# Patient Record
Sex: Female | Born: 1980
Health system: Southern US, Community
[De-identification: ages and names within clinical notes are randomized; demographics above are authoritative.]

## PROBLEM LIST (undated history)

## (undated) DIAGNOSIS — Z807 Family history of other malignant neoplasms of lymphoid, hematopoietic and related tissues: Secondary | ICD-10-CM

## (undated) DIAGNOSIS — A64 Unspecified sexually transmitted disease: Secondary | ICD-10-CM

## (undated) DIAGNOSIS — F32A Depression, unspecified: Secondary | ICD-10-CM

## (undated) DIAGNOSIS — G4730 Sleep apnea, unspecified: Secondary | ICD-10-CM

## (undated) DIAGNOSIS — O139 Gestational [pregnancy-induced] hypertension without significant proteinuria, unspecified trimester: Secondary | ICD-10-CM

## (undated) DIAGNOSIS — R7303 Prediabetes: Secondary | ICD-10-CM

## (undated) DIAGNOSIS — M549 Dorsalgia, unspecified: Secondary | ICD-10-CM

## (undated) DIAGNOSIS — I10 Essential (primary) hypertension: Secondary | ICD-10-CM

## (undated) DIAGNOSIS — K219 Gastro-esophageal reflux disease without esophagitis: Secondary | ICD-10-CM

## (undated) DIAGNOSIS — R0600 Dyspnea, unspecified: Secondary | ICD-10-CM

## (undated) DIAGNOSIS — F419 Anxiety disorder, unspecified: Secondary | ICD-10-CM

## (undated) DIAGNOSIS — F909 Attention-deficit hyperactivity disorder, unspecified type: Secondary | ICD-10-CM

## (undated) DIAGNOSIS — F329 Major depressive disorder, single episode, unspecified: Secondary | ICD-10-CM

## (undated) DIAGNOSIS — Z801 Family history of malignant neoplasm of trachea, bronchus and lung: Secondary | ICD-10-CM

## (undated) HISTORY — DX: Attention-deficit hyperactivity disorder, unspecified type: F90.9

## (undated) HISTORY — PX: OTHER SURGICAL HISTORY: SHX169

## (undated) HISTORY — PX: ASPIRATION BIOPSY: SHX1197

## (undated) HISTORY — DX: Dorsalgia, unspecified: M54.9

## (undated) HISTORY — DX: Family history of malignant neoplasm of trachea, bronchus and lung: Z80.1

## (undated) HISTORY — DX: Anxiety disorder, unspecified: F41.9

## (undated) HISTORY — DX: Family history of other malignant neoplasms of lymphoid, hematopoietic and related tissues: Z80.7

## (undated) HISTORY — DX: Unspecified sexually transmitted disease: A64

## (undated) HISTORY — DX: Depression, unspecified: F32.A

## (undated) HISTORY — DX: Essential (primary) hypertension: I10

---
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
## 2009-03-27 ENCOUNTER — Emergency Department (HOSPITAL_COMMUNITY): Admission: EM | Admit: 2009-03-27 | Discharge: 2009-03-27 | Payer: Self-pay | Admitting: Family Medicine

## 2010-01-02 ENCOUNTER — Emergency Department (HOSPITAL_COMMUNITY): Admission: EM | Admit: 2010-01-02 | Discharge: 2010-01-02 | Payer: Self-pay | Admitting: Family Medicine

## 2012-07-03 ENCOUNTER — Emergency Department (HOSPITAL_COMMUNITY)
Admission: EM | Admit: 2012-07-03 | Discharge: 2012-07-03 | Disposition: A | Discharge status: Home / Self Care | Payer: 59 | Attending: Emergency Medicine | Admitting: Emergency Medicine

## 2012-07-03 ENCOUNTER — Encounter (HOSPITAL_COMMUNITY): Payer: Self-pay | Admitting: *Deleted

## 2012-07-03 DIAGNOSIS — L723 Sebaceous cyst: Secondary | ICD-10-CM | POA: Insufficient documentation

## 2012-07-03 DIAGNOSIS — R229 Localized swelling, mass and lump, unspecified: Secondary | ICD-10-CM

## 2012-07-03 HISTORY — DX: Gestational (pregnancy-induced) hypertension without significant proteinuria, unspecified trimester: O13.9

## 2012-07-03 NOTE — ED Provider Notes (Signed)
History     CSN: [id number]  Arrival date & time 07/03/12  1478   First MD Initiated Contact with Patient 07/03/12 [phone]      Chief Complaint  Patient presents with  . Acne  . Cyst    (Consider location/radiation/quality/duration/timing/severity/associated sxs/prior treatment) HPI  31 year old female presents c/o cyst of L face.  Sts she has noticed a skin nodule on her L face for several years, It has been unchanged.  There has been no associate pain, discharge rash, redness.  No headache, ear pain or jaw pain.  Occasionally it would itch.  Pt recently acquired health insurance and would like to have it evaluated.    Past Medical History  Diagnosis Date  . Gestational hypertension     Past Surgical History  Procedure Date  . Aspiration biopsy     fluid around heart- negative results in 2000    History reviewed. No pertinent family history.  History  Substance Use Topics  . Smoking status: Never Smoker   . Smokeless tobacco: Not on file  . Alcohol Use: No    OB History    Grav Para Term Preterm Abortions TAB SAB Ect Mult Living                  Review of Systems  Constitutional: Negative for fever and chills.  HENT: Negative for neck pain.   Skin: Negative for rash.  Neurological: Negative for numbness.    Allergies  Review of patient's allergies indicates no known allergies.  Home Medications  No current outpatient prescriptions on file.  BP 137/95  Pulse 65  Temp 97.9 F (36.6 C) (Oral)  Resp 18  SpO2 100%  Physical Exam  Nursing note and vitals reviewed. Constitutional: She is oriented to person, place, and time. She appears well-developed and well-nourished. No distress.  HENT:  Head: Normocephalic and atraumatic.       L face: a nodular skin lesion the size of a kidney bean  Anterio/inferior to L ear, firm, non tender, no rash, no fluctuance.    Eyes: Conjunctivae normal are normal.  Neck: Normal range of motion. Neck supple.    Neurological: She is alert and oriented to person, place, and time.  Skin: Skin is warm.  Psychiatric: She has a normal mood and affect.    ED Course  Procedures (including critical care time)  Labs Reviewed - No data to display No results found.   No diagnosis found.  1. Skin nodule, face  MDM  Pt with a nodular skin lesion to L face for several years.  It does not appear to be an abscess, and does not appear to be a cancerous tumor (round, same skin tone, no assymetrical border).  Plan to have pt f/u with dermatology or plastic surgeon for further care.  Pt voice understanding and agrees with plan.    BP 137/95  Pulse 65  Temp 97.9 F (36.6 C) (Oral)  Resp 18  SpO2 100%       Fayrene Helper, PA-C 07/03/12 2130

## 2012-07-03 NOTE — ED Notes (Signed)
PA at bedside.

## 2012-07-03 NOTE — ED Provider Notes (Signed)
Medical screening examination/treatment/procedure(s) were performed by non-physician practitioner and as supervising physician I was immediately available for consultation/collaboration.  Christopher J. Pollina, MD 07/03/12 1549 

## 2012-07-03 NOTE — ED Notes (Signed)
Pt escorted to discharge window. Pt verbalized understanding discharge instructions. In no acute distress.  

## 2012-07-03 NOTE — ED Notes (Signed)
Pt reports she has had a cyst/bump on her left cheek for a couple of years now. Has noticed over years it is getting bigger. Pt now has insurance to take care of bump. Denies pain, no discharge.

## 2012-07-10 ENCOUNTER — Encounter (HOSPITAL_COMMUNITY): Payer: Self-pay

## 2012-07-10 ENCOUNTER — Emergency Department (HOSPITAL_COMMUNITY): Payer: 59

## 2012-07-10 ENCOUNTER — Emergency Department (HOSPITAL_COMMUNITY)
Admission: EM | Admit: 2012-07-10 | Discharge: 2012-07-10 | Disposition: A | Discharge status: Home / Self Care | Payer: 59 | Attending: Emergency Medicine | Admitting: Emergency Medicine

## 2012-07-10 DIAGNOSIS — J111 Influenza due to unidentified influenza virus with other respiratory manifestations: Secondary | ICD-10-CM

## 2012-07-10 DIAGNOSIS — R059 Cough, unspecified: Secondary | ICD-10-CM | POA: Insufficient documentation

## 2012-07-10 DIAGNOSIS — R05 Cough: Secondary | ICD-10-CM | POA: Insufficient documentation

## 2012-07-10 MED ORDER — BENZONATATE 100 MG PO CAPS
200.0000 mg | ORAL_CAPSULE | Freq: Three times a day (TID) | ORAL | Status: DC | PRN
  Administered 2012-07-10: 200 mg via ORAL
  Filled 2012-07-10: qty 2

## 2012-07-10 MED ORDER — IBUPROFEN 800 MG PO TABS
800.0000 mg | ORAL_TABLET | Freq: Once | ORAL | Status: AC
  Administered 2012-07-10: 800 mg via ORAL
  Filled 2012-07-10: qty 1

## 2012-07-10 MED ORDER — HYDROCOD POLST-CHLORPHEN POLST 10-8 MG/5ML PO LQCR: 5.0000 mL | Freq: Two times a day (BID) | ORAL | Status: DC | PRN

## 2012-07-10 MED ORDER — BENZONATATE 200 MG PO CAPS: 200.0000 mg | ORAL_CAPSULE | Freq: Three times a day (TID) | ORAL | Status: DC | PRN

## 2012-07-10 MED ORDER — IBUPROFEN 800 MG PO TABS: 800.0000 mg | ORAL_TABLET | Freq: Four times a day (QID) | ORAL | Status: DC | PRN

## 2012-07-10 NOTE — ED Notes (Signed)
Per pt, cough since yesterday.  Chills, weakness, headache, and runny nose.

## 2012-07-10 NOTE — ED Notes (Signed)
Patient transported to X-ray 

## 2012-07-10 NOTE — ED Notes (Signed)
Discharge instructions reviewed. Rx given x3.  

## 2012-07-10 NOTE — ED Provider Notes (Signed)
History     CSN: [id number]  Arrival date & time 07/10/12  0116   First MD Initiated Contact with Patient 07/10/12 0136      Chief Complaint  Patient presents with  . Cough    (Consider location/radiation/quality/duration/timing/severity/associated sxs/prior treatment) HPI 32 year old female presents to the emergency department with complaint of cough, bodyaches, chills and rhinorrhea. Patient not receive a flu shot this year. She denies any smoking history or lung disease. No known sick contacts. Symptoms came on suddenly yesterday.  Past Medical History  Diagnosis Date  . Gestational hypertension     Past Surgical History  Procedure Date  . Aspiration biopsy     fluid around heart- negative results in 2000    History reviewed. No pertinent family history.  History  Substance Use Topics  . Smoking status: Never Smoker   . Smokeless tobacco: Not on file  . Alcohol Use: No    OB History    Grav Para Term Preterm Abortions TAB SAB Ect Mult Living                  Review of Systems  See History of Present Illness; otherwise all other systems are reviewed and negative  Allergies  Review of patient's allergies indicates no known allergies.  Home Medications   Current Outpatient Rx  Name  Route  Sig  Dispense  Refill  . DEXTROMETHORPHAN POLISTIREX ER 30 MG/5ML PO LQCR   Oral   Take 60 mg by mouth as needed. For cough         . SODIUM & POTASSIUM BICARBONATE PO TBEF   Oral   Take 1 tablet by mouth daily as needed. For cold symptoms           BP 136/81  Pulse 117  Temp 102.2 F (39 C) (Oral)  Resp 20  SpO2 96%  LMP 06/29/2012  Physical Exam  Nursing note and vitals reviewed. Constitutional: She is oriented to person, place, and time. She appears well-developed and well-nourished. She appears distressed (uncomfortable appearing).       Fever noted  HENT:  Head: Normocephalic and atraumatic.  Nose: Nose normal.  Mouth/Throat: Oropharynx is clear  and moist.  Eyes: Conjunctivae normal and EOM are normal. Pupils are equal, round, and reactive to light.  Neck: Normal range of motion. Neck supple. No JVD present. No tracheal deviation present. No thyromegaly present.  Cardiovascular: Normal rate, regular rhythm, normal heart sounds and intact distal pulses.  Exam reveals no gallop and no friction rub.   No murmur heard. Pulmonary/Chest: Effort normal and breath sounds normal. No stridor. No respiratory distress. She has no wheezes. She has no rales. She exhibits no tenderness.       Cough noted  Abdominal: Soft. Bowel sounds are normal. She exhibits no distension and no mass. There is no tenderness. There is no rebound and no guarding.  Musculoskeletal: Normal range of motion. She exhibits no edema and no tenderness.  Lymphadenopathy:    She has no cervical adenopathy.  Neurological: She is alert and oriented to person, place, and time. She exhibits normal muscle tone. Coordination normal.  Skin: Skin is warm and dry. No rash noted. No erythema. No pallor.  Psychiatric: She has a normal mood and affect. Her behavior is normal. Judgment and thought content normal.    ED Course  Procedures (including critical care time)  Labs Reviewed - No data to display Dg Chest 2 View  07/10/2012  *RADIOLOGY REPORT*  Clinical  Data: Cough, congestion, fever  CHEST - 2 VIEW  Comparison: None  Findings: Enlargement of cardiac silhouette. Mediastinal contours and pulmonary vascularity normal. Lungs clear. No pleural effusion or pneumothorax. Bones unremarkable.  IMPRESSION: No acute abnormalities.   Original Report Authenticated By: Ulyses Southward, M.D.      1. Influenza-like illness       MDM  Patient with cough and fever, suspect influenza-like illness. Will check chest x-ray. Patient advised symptomatic care with Tylenol and Vicoprofen and plenty of fluids along with rest        Olivia Mackie, MD 07/10/12 0300

## 2012-07-10 NOTE — ED Notes (Signed)
Patient states sore throat and cough started yesterday, came home from work this morning and went to bed. Patient woke up feeling worse, began running a fever and feeling achy. Patient states she did not have a flu shot this year. Patient offered Tessalon, refused at this time.

## 2013-08-11 ENCOUNTER — Encounter (HOSPITAL_COMMUNITY): Payer: Self-pay | Admitting: Emergency Medicine

## 2013-08-11 ENCOUNTER — Emergency Department (HOSPITAL_COMMUNITY)
Admission: EM | Admit: 2013-08-11 | Discharge: 2013-08-12 | Disposition: A | Discharge status: Home / Self Care | Payer: PRIVATE HEALTH INSURANCE | Attending: Emergency Medicine | Admitting: Emergency Medicine

## 2013-08-11 DIAGNOSIS — R1013 Epigastric pain: Secondary | ICD-10-CM | POA: Insufficient documentation

## 2013-08-11 DIAGNOSIS — Z3202 Encounter for pregnancy test, result negative: Secondary | ICD-10-CM | POA: Insufficient documentation

## 2013-08-11 DIAGNOSIS — Z8679 Personal history of other diseases of the circulatory system: Secondary | ICD-10-CM | POA: Insufficient documentation

## 2013-08-11 DIAGNOSIS — Z79899 Other long term (current) drug therapy: Secondary | ICD-10-CM | POA: Insufficient documentation

## 2013-08-11 LAB — CBC WITH DIFFERENTIAL/PLATELET
BASOS ABS: 0 10*3/uL (ref 0.0–0.1)
Basophils Relative: 0 % (ref 0–1)
EOS ABS: 0.1 10*3/uL (ref 0.0–0.7)
EOS PCT: 1 % (ref 0–5)
HEMATOCRIT: 39.9 % (ref 36.0–46.0)
Hemoglobin: 13.1 g/dL (ref 12.0–15.0)
LYMPHS PCT: 20 % (ref 12–46)
Lymphs Abs: 2 10*3/uL (ref 0.7–4.0)
MCH: 25.7 pg — AB (ref 26.0–34.0)
MCHC: 32.8 g/dL (ref 30.0–36.0)
MCV: 78.2 fL (ref 78.0–100.0)
MONO ABS: 0.5 10*3/uL (ref 0.1–1.0)
Monocytes Relative: 5 % (ref 3–12)
Neutro Abs: 7.6 10*3/uL (ref 1.7–7.7)
Neutrophils Relative %: 75 % (ref 43–77)
PLATELETS: 282 10*3/uL (ref 150–400)
RBC: 5.1 MIL/uL (ref 3.87–5.11)
RDW: 14.6 % (ref 11.5–15.5)
WBC: 10.2 10*3/uL (ref 4.0–10.5)

## 2013-08-11 LAB — URINALYSIS, ROUTINE W REFLEX MICROSCOPIC
BILIRUBIN URINE: NEGATIVE
GLUCOSE, UA: NEGATIVE mg/dL
HGB URINE DIPSTICK: NEGATIVE
KETONES UR: NEGATIVE mg/dL
Nitrite: NEGATIVE
PROTEIN: NEGATIVE mg/dL
Specific Gravity, Urine: 1.029 (ref 1.005–1.030)
UROBILINOGEN UA: 1 mg/dL (ref 0.0–1.0)
pH: 7 (ref 5.0–8.0)

## 2013-08-11 LAB — COMPREHENSIVE METABOLIC PANEL
ALT: 13 U/L (ref 0–35)
AST: 16 U/L (ref 0–37)
Albumin: 3.3 g/dL — ABNORMAL LOW (ref 3.5–5.2)
Alkaline Phosphatase: 60 U/L (ref 39–117)
BUN: 13 mg/dL (ref 6–23)
CALCIUM: 8.8 mg/dL (ref 8.4–10.5)
CO2: 23 meq/L (ref 19–32)
CREATININE: 0.86 mg/dL (ref 0.50–1.10)
Chloride: 103 mEq/L (ref 96–112)
GFR, EST NON AFRICAN AMERICAN: 88 mL/min — AB (ref >90)
GLUCOSE: 118 mg/dL — AB (ref 70–99)
Potassium: 4.3 mEq/L (ref 3.7–5.3)
SODIUM: 136 meq/L — AB (ref 137–147)
TOTAL PROTEIN: 7.1 g/dL (ref 6.0–8.3)
Total Bilirubin: 0.3 mg/dL (ref 0.3–1.2)

## 2013-08-11 LAB — URINE MICROSCOPIC-ADD ON

## 2013-08-11 LAB — LIPASE, BLOOD: Lipase: 35 U/L (ref 11–59)

## 2013-08-11 LAB — POCT PREGNANCY, URINE: PREG TEST UR: NEGATIVE

## 2013-08-11 NOTE — ED Provider Notes (Signed)
CSN: [id number]     Arrival date & time 08/11/13  2156 History   First MD Initiated Contact with Patient 08/11/13 2300     Chief Complaint  Patient presents with  . Abdominal Pain   (Consider location/radiation/quality/duration/timing/severity/associated sxs/prior Treatment) The history is provided by the patient and medical records. No language interpreter was used.    Vanessa Wilcox is a 33 y.o. female  with no major medical hx presents to the Emergency Department complaining of gradual, waxing and waning "cramping" epigastric pain abd pain onset 12-1pm while eating at [employer] (but veggies only), but resolving on the ride here. No associated symptoms  Pt reports attempting to belch and taking aleve which did not help.  She rated the pain at 8-10 throughout the afternoon and evening, but now completely resolved.  No abd surgeries.   She has occasional heatburn but no chronic reflux.  Putting pressure at the site made it better, but nothing made it worse.  Pt reports BM today that was loose, but normal for pt.  Pt denies fever, chills, headache, CP, SOB, N/V/D, weakness, dizziness, syncope, dysuria, hematuria.     Past Medical History  Diagnosis Date  . Gestational hypertension    Past Surgical History  Procedure Laterality Date  . Aspiration biopsy      fluid around heart- negative results in 2000   No family history on file. History  Substance Use Topics  . Smoking status: Never Smoker   . Smokeless tobacco: Not on file  . Alcohol Use: No   OB History   Grav Para Term Preterm Abortions TAB SAB Ect Mult Living                 Review of Systems  Constitutional: Negative for fever, diaphoresis, appetite change, fatigue and unexpected weight change.  HENT: Negative for mouth sores and trouble swallowing.   Respiratory: Negative for cough, chest tightness, shortness of breath, wheezing and stridor.   Cardiovascular: Negative for chest pain and palpitations.  Gastrointestinal:  Positive for abdominal pain. Negative for nausea, vomiting, diarrhea, constipation, blood in stool, abdominal distention and rectal pain.  Genitourinary: Negative for dysuria, urgency, frequency, hematuria, flank pain and difficulty urinating.  Musculoskeletal: Negative for back pain, neck pain and neck stiffness.  Skin: Negative for rash.  Neurological: Negative for weakness.  Hematological: Negative for adenopathy.  Psychiatric/Behavioral: Negative for confusion.  All other systems reviewed and are negative.    Allergies  Review of patient's allergies indicates no known allergies.  Home Medications   Current Outpatient Rx  Name  Route  Sig  Dispense  Refill  . ibuprofen (ADVIL,MOTRIN) 800 MG tablet   Oral   Take 1 tablet (800 mg total) by mouth every 6 (six) hours as needed for pain.   30 tablet   0   . levonorgestrel (MIRENA) 20 MCG/24HR IUD   Intrauterine   1 each by Intrauterine route once.         . naproxen sodium (ANAPROX) 220 MG tablet   Oral   Take 220 mg by mouth 2 (two) times daily with a meal.         . omeprazole (PRILOSEC) 20 MG capsule   Oral   Take 1 capsule (20 mg total) by mouth daily.   30 capsule   0   . ondansetron (ZOFRAN ODT) 8 MG disintegrating tablet      8mg  ODT q4 hours prn nausea   10 tablet   2   .  oxyCODONE-acetaminophen (PERCOCET/ROXICET) 5-325 MG per tablet   Oral   Take 1-2 tablets by mouth every 4 (four) hours as needed for severe pain.   11 tablet   0    BP 128/79  Pulse 62  Temp(Src) 98.2 F (36.8 C) (Oral)  Resp 18  SpO2 99%  LMP 08/02/2013 Physical Exam  Nursing note and vitals reviewed. Constitutional: She is oriented to person, place, and time. She appears well-developed and well-nourished. No distress.  Awake, alert, nontoxic appearance  HENT:  Head: Normocephalic and atraumatic.  Mouth/Throat: Oropharynx is clear and moist. No oropharyngeal exudate.  Eyes: Conjunctivae are normal. No scleral icterus.   Neck: Normal range of motion. Neck supple.  Cardiovascular: Normal rate, regular rhythm, normal heart sounds and intact distal pulses.   No murmur heard. RRR  Pulmonary/Chest: Effort normal and breath sounds normal. No respiratory distress. She has no wheezes.  Abdominal: Soft. Bowel sounds are normal. She exhibits no distension and no mass. There is tenderness in the epigastric area. There is no rebound and no guarding.  Very mild epigastric tenderness  Musculoskeletal: Normal range of motion. She exhibits no edema.  Lymphadenopathy:    She has no cervical adenopathy.  Neurological: She is alert and oriented to person, place, and time. She exhibits normal muscle tone. Coordination normal.  Speech is clear and goal oriented Moves extremities without ataxia  Skin: Skin is warm and dry. She is not diaphoretic. No erythema.  Psychiatric: She has a normal mood and affect.    ED Course  Procedures (including critical care time) Labs Review Labs Reviewed  URINALYSIS, ROUTINE W REFLEX MICROSCOPIC - Abnormal; Notable for the following:    Leukocytes, UA MODERATE (*)    All other components within normal limits  CBC WITH DIFFERENTIAL - Abnormal; Notable for the following:    MCH 25.7 (*)    All other components within normal limits  COMPREHENSIVE METABOLIC PANEL - Abnormal; Notable for the following:    Sodium 136 (*)    Glucose, Bld 118 (*)    Albumin 3.3 (*)    GFR calc non Af Amer 88 (*)    All other components within normal limits  URINE MICROSCOPIC-ADD ON - Abnormal; Notable for the following:    Squamous Epithelial / LPF MANY (*)    All other components within normal limits  LIPASE, BLOOD  POCT PREGNANCY, URINE   Imaging Review US Abdomen Complete  08/12/2013   CLINICAL DATA:  33 year old female with abdominal pain.  EXAM: ULTRASOUND ABDOMEN COMPLETE  COMPARISON:  None.  FINDINGS: Gallbladder:  The gallbladder is unremarkable. There is no evidence of cholelithiasis or acute  cholecystitis.  Common bile duct:  Diameter: 3 mm. There is no evidence of intrahepatic or extrahepatic biliary dilatation. The visualized CBD is unremarkable.  Liver:  No focal lesion identified. Within normal limits in parenchymal echogenicity.  IVC:  No abnormality visualized.  Pancreas:  Visualized portion unremarkable.  Spleen:  Size and appearance within normal limits.  Right Kidney:  Length: 9.5 cm. Echogenicity within normal limits. No mass or hydronephrosis visualized.  Left Kidney:  Length: 9.7 cm. Echogenicity within normal limits. No mass or hydronephrosis visualized.  Abdominal aorta:  No aneurysm visualized.  Other findings:  None.  IMPRESSION: Normal abdominal ultrasound.   Electronically Signed   By: Hassan Rowan M.D.   On: 08/12/2013 01:01    EKG Interpretation   None       MDM   1. Epigastric abdominal pain  Vanessa Wilcox presents with epigastric abd pain now resolved.  Labs unremarkable and reassuring.   Alert, oriented, nontoxic and nonseptic appearing. Afebrile and not tachycardic. CBC without leukocytosis CMP and lipase within normal limits. Pregnancy test normal urinalysis without evidence of urinary tract infection.  Patient reports that mild epigastric discomfort seems to be returning. Will obtain abdominal ultrasound to rule out cholelithiasis.  1:43 AM Abdominal ultrasound with no evidence of cholecystitis, choledocholithiasis or cholelithiasis.    2:03 AM Patient given Protonix, Zofran here in the emergency department with improvement in discomfort. Patient now reporting she is 100% back to normal. We'll discharge home with PPI, nausea medicine and pain control. She preterm precautions given.  Patient is nontoxic, nonseptic appearing, in no apparent distress.  Patient's pain and other symptoms adequately managed in emergency department.  Fluid bolus given.  Labs, imaging and vitals reviewed.  Patient does not meet the SIRS or Sepsis criteria.  On repeat exam patient  does not have a surgical abdomin and there are nor peritoneal signs.  No indication of appendicitis, bowel obstruction, bowel perforation, cholecystitis, diverticulitis, PID or ectopic pregnancy.  Patient discharged home with symptomatic treatment and given strict instructions for follow-up with their primary care physician.  I have also discussed reasons to return immediately to the ER.  Patient expresses understanding and agrees with plan.  It has been determined that no acute conditions requiring further emergency intervention are present at this time. The patient/guardian have been advised of the diagnosis and plan. We have discussed signs and symptoms that warrant return to the ED, such as changes or worsening in symptoms.   Vital signs are stable at discharge.   BP 128/79  Pulse 62  Temp(Src) 98.2 F (36.8 C) (Oral)  Resp 18  SpO2 99%  LMP 08/02/2013  Patient/guardian has voiced understanding and agreed to follow-up with the PCP or specialist.           Abigail Butts, PA-C 08/12/13 0205

## 2013-08-11 NOTE — ED Notes (Signed)
Pt from home with "cramping" abd pain, epigastric, 8/10, when present however pt denies pain at this time.  Onset after lunch.  Pt denies n/v/d/c.  Pt denies hx of similar pain.  Pt is well appearing, joking and laughing in triage. Skin pwd.  MAEI, amb with steady gait.  Speaking full/clear sentences.

## 2013-08-12 ENCOUNTER — Emergency Department (HOSPITAL_COMMUNITY): Payer: PRIVATE HEALTH INSURANCE

## 2013-08-12 MED ORDER — ONDANSETRON 8 MG PO TBDP
8.0000 mg | ORAL_TABLET | Freq: Once | ORAL | Status: AC
  Administered 2013-08-12: 8 mg via ORAL
  Filled 2013-08-12: qty 1

## 2013-08-12 MED ORDER — OMEPRAZOLE 20 MG PO CPDR: 20.0000 mg | DELAYED_RELEASE_CAPSULE | Freq: Every day | ORAL | Status: DC

## 2013-08-12 MED ORDER — PANTOPRAZOLE SODIUM 40 MG PO TBEC
40.0000 mg | DELAYED_RELEASE_TABLET | Freq: Once | ORAL | Status: AC
  Administered 2013-08-12: 40 mg via ORAL
  Filled 2013-08-12: qty 1

## 2013-08-12 MED ORDER — ONDANSETRON 8 MG PO TBDP: ORAL_TABLET | ORAL | Status: DC

## 2013-08-12 MED ORDER — OXYCODONE-ACETAMINOPHEN 5-325 MG PO TABS: 1.0000 | ORAL_TABLET | ORAL | Status: DC | PRN

## 2013-08-12 NOTE — Discharge Instructions (Signed)
1. Medications: omeprazole, percocet, zofran, usual home medications 2. Treatment: rest, drink plenty of fluids,  3. Follow Up: Please followup with your primary doctor for discussion of your diagnoses and further evaluation after today's visit; if you do not have a primary care doctor use the resource guide provided to find one;    Gastroesophageal Reflux Disease, Adult Gastroesophageal reflux disease (GERD) happens when acid from your stomach flows up into the esophagus. When acid comes in contact with the esophagus, the acid causes soreness (inflammation) in the esophagus. Over time, GERD may create small holes (ulcers) in the lining of the esophagus. CAUSES   Increased body weight. This puts pressure on the stomach, making acid rise from the stomach into the esophagus.  Smoking. This increases acid production in the stomach.  Drinking alcohol. This causes decreased pressure in the lower esophageal sphincter (valve or ring of muscle between the esophagus and stomach), allowing acid from the stomach into the esophagus.  Late evening meals and a full stomach. This increases pressure and acid production in the stomach.  A malformed lower esophageal sphincter. Sometimes, no cause is found. SYMPTOMS   Burning pain in the lower part of the mid-chest behind the breastbone and in the mid-stomach area. This may occur twice a week or more often.  Trouble swallowing.  Sore throat.  Dry cough.  Asthma-like symptoms including chest tightness, shortness of breath, or wheezing. DIAGNOSIS  Your caregiver may be able to diagnose GERD based on your symptoms. In some cases, X-rays and other tests may be done to check for complications or to check the condition of your stomach and esophagus. TREATMENT  Your caregiver may recommend over-the-counter or prescription medicines to help decrease acid production. Ask your caregiver before starting or adding any new medicines.  HOME CARE INSTRUCTIONS    Change the factors that you can control. Ask your caregiver for guidance concerning weight loss, quitting smoking, and alcohol consumption.  Avoid foods and drinks that make your symptoms worse, such as:  Caffeine or alcoholic drinks.  Chocolate.  Peppermint or mint flavorings.  Garlic and onions.  Spicy foods.  Citrus fruits, such as oranges, lemons, or limes.  Tomato-based foods such as sauce, chili, salsa, and pizza.  Fried and fatty foods.  Avoid lying down for the 3 hours prior to your bedtime or prior to taking a nap.  Eat small, frequent meals instead of large meals.  Wear loose-fitting clothing. Do not wear anything tight around your waist that causes pressure on your stomach.  Raise the head of your bed 6 to 8 inches with wood blocks to help you sleep. Extra pillows will not help.  Only take over-the-counter or prescription medicines for pain, discomfort, or fever as directed by your caregiver.  Do not take aspirin, ibuprofen, or other nonsteroidal anti-inflammatory drugs (NSAIDs). SEEK IMMEDIATE MEDICAL CARE IF:   You have pain in your arms, neck, jaw, teeth, or back.  Your pain increases or changes in intensity or duration.  You develop nausea, vomiting, or sweating (diaphoresis).  You develop shortness of breath, or you faint.  Your vomit is green, yellow, black, or looks like coffee grounds or blood.  Your stool is red, bloody, or black. These symptoms could be signs of other problems, such as heart disease, gastric bleeding, or esophageal bleeding. MAKE SURE YOU:   Understand these instructions.  Will watch your condition.  Will get help right away if you are not doing well or get worse. Document Released: 04/01/2005 Document  Revised: 09/14/2011 Document Reviewed: 01/09/2011 [facility] Patient Information 2014 [place], [place].   Emergency Department Resource Guide 1) Find a Doctor and Pay Out of Pocket Although you won't have to find out who  is covered by your insurance plan, it is a good idea to ask around and get recommendations. You will then need to call the office and see if the doctor you have chosen will accept you as a new patient and what types of options they offer for patients who are self-pay. Some doctors offer discounts or will set up payment plans for their patients who do not have insurance, but you will need to ask so you aren't surprised when you get to your appointment.  2) Contact Your Local Health Department Not all health departments have doctors that can see patients for sick visits, but many do, so it is worth a call to see if yours does. If you don't know where your local health department is, you can check in your phone book. The CDC also has a tool to help you locate your state's health department, and many state websites also have listings of all of their local health departments.  3) Find a [facility] If your illness is not likely to be very severe or complicated, you may want to try a walk in clinic. These are popping up all over the country in pharmacies, drugstores, and shopping centers. They're usually staffed by nurse practitioners or physician assistants that have been trained to treat common illnesses and complaints. They're usually fairly quick and inexpensive. However, if you have serious medical issues or chronic medical problems, these are probably not your best option.  No Primary Care Doctor: - Call Health Connect at  [phone] - they can help you locate a primary care doctor that  accepts your insurance, provides certain services, etc. - Physician Referral Service- [phone]  Chronic Pain Problems: Organization         Address  Phone   Notes  [facility]  [phone] Patients need to be referred by their primary care doctor.   Medication Assistance: Organization         Address  Phone   Notes  [facility] Medication [facility] [place] [address]  [place], [place] [phone] --Must be a resident of [facility] -- Must have NO insurance coverage whatsoever (no Medicaid/ Medicare, etc.) -- The pt. MUST have a primary care doctor that directs their care regularly and follows them in the community   MedAssist  [phone]   [employer]  [phone]    Agencies that provide inexpensive medical care: Organization         Address  Phone   Notes  [facility]  [phone]   [facility]    [phone]   [facility] [place], [place] [address] [phone]   [place] [address] [place] [phone]   Planned Parenthood    [phone]   [facility]    [phone]   [place] and [address], [place] Phone:  [phone], Fax:  [phone] Hours of Operation:  9 am - 6 pm, M-F.  Also accepts Medicaid/Medicare and self-pay.  [facility] for [place], [address], [place] Phone: [phone], Fax: [phone]. Hours of Operation:  8:30 am -  5:30 pm, M-F.  Also accepts Medicaid and self-pay.  [facility] [place] [address], [place] Phone: [phone]   [place], [place], [place] [phone], Ext. [phone] Mondays & Thursdays: 7-9 AM.  First 15 patients are seen on a first come, first serve basis.    [place] Providers:  Organization         Address  Phone   Notes  [facility] [address], [place] [phone] Also accepts self-pay patients.  [facility] [address], [place]  [phone]   [place], [address], [place] [phone]   [facility] [address] [place] [phone]   Lucianne Lei [address], [place]   [phone] Only accepts [place] Access [place] patients after they have their name applied to their card.   Self-Pay (no insurance) in [facility]:  Organization         Address  Phone   Notes  Sickle Cell Patients, [facility] Internal Medicine [place] [phone]   [facility] [place] [phone]   [facility] [place]  [place], [place], [place] [phone]   [facility]/Dr. Osei-Bonsu  [address], [place] or [address], [place] [phone] Phone number for both [place] and [place] locations is the same.  Urgent Medical and [facility] [address], [place] [phone]   [facility] [address] [place] or [address] [phone]   [facility] [address], [place] [phone], phone; [phone], fax Sees patients 1st and 3rd Saturday of every month.  Must not qualify for public or private insurance (i.e. Medicaid, Medicare, [place] Health Choice, Veterans' Benefits)  Household income should be no more than 200% of the poverty level The clinic cannot treat you if you are pregnant or think you are pregnant  Sexually transmitted diseases are not treated at the clinic.    Dental Care: Organization         Address  Phone  Notes  [facility] [place] [phone] Accepts children up to age 58 who are enrolled in [place] or [place]; pregnant women with a Medicaid card; and children who have applied for Medicaid or [place] Health Choice, but were declined, whose parents can pay a reduced fee at time of service.  [facility] Department of [facility]  [address], [place] [phone] Accepts children up to age 7 who are  enrolled in [place] or [place]; pregnant women with a Medicaid card; and children who have applied for Medicaid or [place] Health Choice, but were declined, whose parents can pay a reduced fee at time of service.  [place] Adult [facility] Access PRO[facility]  [place] [phone] Patients are seen by appointment only. Walk-ins are not accepted. [place] will see patients 79 years of age and older. Monday - Tuesday (8am-5pm) Most Wednesdays (8:30-5pm) $30 per visit, cash only  [facility] Adult Dental Access PROGRAM  [address], [facility] [phone] Patients are seen by appointment only. Walk-ins are  not accepted. [place] will see patients 82 years of age and older. One Wednesday Evening (Monthly: Volunteer Based).  $30 per visit, cash only  [place]  [phone] for adults; Children under age 75, call Graduate Pediatric Dentistry at [phone]. Children aged 46-14, please call [phone] to request a pediatric application.  Dental services are provided in all areas of dental care including fillings, crowns and bridges, complete and partial dentures, implants, gum treatment, root canals, and extractions. Preventive care is also provided. Treatment is provided to both adults and children. Patients are selected via a lottery and there is often a waiting list.   [facility] [address] [place]  [phone] [email or web address]   [facility] [address] [place], [place] [phone], Ext. [phone] Second and Fourth Thursday of each month, opens at 6:30 AM; Clinic ends at 9 AM.  Patients are seen on a first-come first-served basis, and a limited number are seen during each clinic.   [facility]  [address] Hillard Danker [place], [place] [phone]   Eligibility Requirements You must have lived in [place], [place], or [place] counties for at least the last three months.   You  cannot be eligible for state or federal sponsored [employer], including [employer], [place], or [employer].   You generally cannot be eligible for healthcare insurance through your employer.    How to apply: Eligibility screenings are held every Tuesday and Wednesday afternoon from 1:00 pm until 4:00 pm. You do not need an appointment for the interview!  [facility] [address] [place], [place]   [place]  [place] Department  [place]  [phone]    Behavioral Health Resources in the Community: Intensive Outpatient Programs Organization         Address  Phone  Notes  [place]. [address], [place], [place] [phone]   [facility]patient [address], [place], [place]   ADS: Alcohol & Drug Svcs [address], [place], [place]   [place] [address]  [place], [place] or [phone]   Substance Abuse Resources Organization         Address  Phone  Notes  Alcohol and Drug Services  [phone]   [place]  [phone]   [facility] [place]   Chinita Pester  [phone]   Residential & Outpatient Substance Abuse Program  [phone]   Psychological Services Organization         Address  Phone  Notes  [facility] [place]  [place]  [phone]   [place] [address] [place] or [phone]    Mobile Crisis Teams Organization         Address  Phone  Notes  [facility], Mobile Crisis Care Unit  [phone]   [facility]ive Psychotherapeutic Services  [address] [place], [place]   [place] [address] [place] [phone]    Self-Help/Support  Groups Organization         Address  Phone             Notes  [place]. of [place] - variety of support groups  [place] Call for more information  Narcotics Anonymous (NA),  [facility] [address] [employer] [place]  2 meetings at this location   Residential Facilities manager         Address  Phone  Notes  [facility] Residential Treatment [place],    [place]  [phone]-([phone]   [facility]  [address], [place] [id number], [place], [place]   [place], [place] [phone] Admissions: 8am-3pm M-F  Incentives Substance [place] [address],    [place], [place] [phone]   [facility] [address] [place], [place], [place]   [facility] [address],  [place], [place]   Insight Programs - Intensive Outpatient [address] Kristeen Mans 2, [place], [place]   [facility] ([place].) [place].,  [place], [place] [phone] or [phone]   [facility] Services (RTS) [address] [place], [place] Accepts Medicaid  [facility] [place] [address]  [place] [phone] Substance Abuse/Addiction Treatment   [facility] Organization         Address  Phone  Notes  [facility]  [phone]   Domenic Schwab, PhD [address] Arlis Porta [place], [place]   [phone] or [phone]   [facility], [place] [phone]   [facility] [address] [place], [place] [phone] Insurance/Medicaid/sponsorship through [facility] and Families [address] [place]                                    [place], [place] [phone] [place] [address], [place] [phone]    Dr. Adele Schilder  [phone]   [facility] Dept. 1) [address], [place] 2) [place] 3)  [place] [address], [place] [phone]  [phone]   [place] [phone] or [phone] (After Hours)

## 2013-08-21 NOTE — ED Provider Notes (Signed)
Medical screening examination/treatment/procedure(s) were performed by non-physician practitioner and as supervising physician I was immediately available for consultation/collaboration.    Kalman Drape, MD 08/21/13 [phone]

## 2016-10-03 ENCOUNTER — Emergency Department (HOSPITAL_COMMUNITY): Payer: BC Managed Care – PPO

## 2016-10-03 ENCOUNTER — Encounter (HOSPITAL_COMMUNITY): Payer: Self-pay | Admitting: Emergency Medicine

## 2016-10-03 ENCOUNTER — Emergency Department (HOSPITAL_COMMUNITY)
Admission: EM | Admit: 2016-10-03 | Discharge: 2016-10-04 | Disposition: A | Discharge status: Home / Self Care | Payer: BC Managed Care – PPO | Attending: Emergency Medicine | Admitting: Emergency Medicine

## 2016-10-03 DIAGNOSIS — N739 Female pelvic inflammatory disease, unspecified: Secondary | ICD-10-CM | POA: Insufficient documentation

## 2016-10-03 DIAGNOSIS — N730 Acute parametritis and pelvic cellulitis: Secondary | ICD-10-CM

## 2016-10-03 DIAGNOSIS — R52 Pain, unspecified: Secondary | ICD-10-CM

## 2016-10-03 DIAGNOSIS — R109 Unspecified abdominal pain: Secondary | ICD-10-CM | POA: Diagnosis present

## 2016-10-03 LAB — COMPREHENSIVE METABOLIC PANEL
ALT: 13 U/L — ABNORMAL LOW (ref 14–54)
ANION GAP: 10 (ref 5–15)
AST: 17 U/L (ref 15–41)
Albumin: 3.1 g/dL — ABNORMAL LOW (ref 3.5–5.0)
Alkaline Phosphatase: 56 U/L (ref 38–126)
BUN: 6 mg/dL (ref 6–20)
CHLORIDE: 104 mmol/L (ref 101–111)
CO2: 22 mmol/L (ref 22–32)
CREATININE: 0.66 mg/dL (ref 0.44–1.00)
Calcium: 8.5 mg/dL — ABNORMAL LOW (ref 8.9–10.3)
Glucose, Bld: 111 mg/dL — ABNORMAL HIGH (ref 65–99)
POTASSIUM: 3.9 mmol/L (ref 3.5–5.1)
SODIUM: 136 mmol/L (ref 135–145)
Total Bilirubin: 0.6 mg/dL (ref 0.3–1.2)
Total Protein: 7 g/dL (ref 6.5–8.1)

## 2016-10-03 LAB — WET PREP, GENITAL
SPERM: NONE SEEN
YEAST WET PREP: NONE SEEN

## 2016-10-03 LAB — URINALYSIS, ROUTINE W REFLEX MICROSCOPIC
BACTERIA UA: NONE SEEN
Bilirubin Urine: NEGATIVE
Glucose, UA: NEGATIVE mg/dL
KETONES UR: NEGATIVE mg/dL
Nitrite: NEGATIVE
PROTEIN: NEGATIVE mg/dL
Specific Gravity, Urine: 1.016 (ref 1.005–1.030)
pH: 7 (ref 5.0–8.0)

## 2016-10-03 LAB — CBC
HEMATOCRIT: 35.5 % — AB (ref 36.0–46.0)
HEMOGLOBIN: 11.9 g/dL — AB (ref 12.0–15.0)
MCH: 26.2 pg (ref 26.0–34.0)
MCHC: 33.5 g/dL (ref 30.0–36.0)
MCV: 78.2 fL (ref 78.0–100.0)
Platelets: 272 10*3/uL (ref 150–400)
RBC: 4.54 MIL/uL (ref 3.87–5.11)
RDW: 14.2 % (ref 11.5–15.5)
WBC: 13.3 10*3/uL — AB (ref 4.0–10.5)

## 2016-10-03 LAB — I-STAT BETA HCG BLOOD, ED (MC, WL, AP ONLY)

## 2016-10-03 LAB — LIPASE, BLOOD: Lipase: 16 U/L (ref 11–51)

## 2016-10-03 MED ORDER — DEXTROSE 5 % IV SOLN
1.0000 g | Freq: Once | INTRAVENOUS | Status: DC
  Filled 2016-10-03: qty 10

## 2016-10-03 MED ORDER — ACETAMINOPHEN 325 MG PO TABS
650.0000 mg | ORAL_TABLET | Freq: Once | ORAL | Status: AC | PRN
  Administered 2016-10-03: 650 mg via ORAL

## 2016-10-03 MED ORDER — SODIUM CHLORIDE 0.9 % IV BOLUS (SEPSIS)
1000.0000 mL | Freq: Once | INTRAVENOUS | Status: AC
  Administered 2016-10-03: 1000 mL via INTRAVENOUS

## 2016-10-03 MED ORDER — OXYCODONE-ACETAMINOPHEN 5-325 MG PO TABS
2.0000 | ORAL_TABLET | Freq: Once | ORAL | Status: AC
  Administered 2016-10-03: 2 via ORAL
  Filled 2016-10-03: qty 2

## 2016-10-03 MED ORDER — ACETAMINOPHEN 325 MG PO TABS
650.0000 mg | ORAL_TABLET | Freq: Once | ORAL | Status: DC
Start: 2016-10-03 — End: 2016-10-03
  Filled 2016-10-03: qty 2

## 2016-10-03 MED ORDER — DOXYCYCLINE HYCLATE 100 MG PO CAPS: 100.0000 mg | ORAL_CAPSULE | Freq: Two times a day (BID) | ORAL | 0 refills | Status: DC

## 2016-10-03 MED ORDER — MORPHINE SULFATE (PF) 4 MG/ML IV SOLN
2.0000 mg | Freq: Once | INTRAVENOUS | Status: AC
  Administered 2016-10-03: 2 mg via INTRAVENOUS
  Filled 2016-10-03: qty 1

## 2016-10-03 MED ORDER — IOPAMIDOL (ISOVUE-300) INJECTION 61%
INTRAVENOUS | Status: AC
  Administered 2016-10-03: 100 mL
  Filled 2016-10-03: qty 100

## 2016-10-03 MED ORDER — CEFTRIAXONE SODIUM 1 G IJ SOLR
1.0000 g | Freq: Once | INTRAMUSCULAR | Status: AC
  Administered 2016-10-03: 1 g via INTRAMUSCULAR
  Filled 2016-10-03: qty 10

## 2016-10-03 MED ORDER — AZITHROMYCIN 250 MG PO TABS
1000.0000 mg | ORAL_TABLET | Freq: Once | ORAL | Status: AC
  Administered 2016-10-03: 1000 mg via ORAL
  Filled 2016-10-03: qty 4

## 2016-10-03 MED ORDER — LIDOCAINE HCL (PF) 1 % IJ SOLN
INTRAMUSCULAR | Status: AC
  Administered 2016-10-03: 2.1 mL
  Filled 2016-10-03: qty 5

## 2016-10-03 MED ORDER — ONDANSETRON HCL 4 MG/2ML IJ SOLN
4.0000 mg | Freq: Once | INTRAMUSCULAR | Status: DC
  Filled 2016-10-03: qty 2

## 2016-10-03 MED ORDER — ACETAMINOPHEN 325 MG PO TABS
ORAL_TABLET | ORAL | Status: AC
  Administered 2016-10-03: 650 mg via ORAL
  Filled 2016-10-03: qty 2

## 2016-10-03 MED ORDER — ONDANSETRON HCL 4 MG/2ML IJ SOLN
4.0000 mg | Freq: Once | INTRAMUSCULAR | Status: AC
  Administered 2016-10-03: 4 mg via INTRAVENOUS
  Filled 2016-10-03: qty 2

## 2016-10-03 MED ORDER — METRONIDAZOLE 500 MG PO TABS: 500.0000 mg | ORAL_TABLET | Freq: Two times a day (BID) | ORAL | 0 refills | Status: DC

## 2016-10-03 NOTE — ED Provider Notes (Signed)
[facility] DEPT Provider Note   CSN: [id number] Arrival date & time: 10/03/16  1853     History   Chief Complaint Chief Complaint  Patient presents with  . Abdominal Pain    HPI Vanessa Wilcox is a 36 y.o. female.  HPI This is a 36 year old female who comes in today complaining of abdominal pain for 2 days. She states that she feels like it is crampy in nature she is having some loss of appetite but no nausea or vomiting. Bowel movements oral. She has not had a fever until several hours ago when she had some chills. He has not taken any medications. She denies any urinary tract infection symptoms or abnormal vaginal discharge. Should she has an IUD in place and had a normal menstrual cycle within the past month. She has had 3 pregnancies and has one 62 year old child. Past Medical History:  Diagnosis Date  . Gestational hypertension     There are no active problems to display for this patient.   Past Surgical History:  Procedure Laterality Date  . ASPIRATION BIOPSY     fluid around heart- negative results in 2000    OB History    No data available       Home Medications    Prior to Admission medications   Medication Sig Start Date End Date Taking? Authorizing Provider  ibuprofen (ADVIL,MOTRIN) 800 MG tablet Take 1 tablet (800 mg total) by mouth every 6 (six) hours as needed for pain. 07/10/12  Yes Linton Flemings, MD  levonorgestrel (MIRENA) 20 MCG/24HR IUD 1 each by Intrauterine route once.   Yes Historical Provider, MD  naproxen sodium (ANAPROX) 220 MG tablet Take 220 mg by mouth 2 (two) times daily as needed (pain).    Yes Historical Provider, MD  ondansetron (ZOFRAN ODT) 8 MG disintegrating tablet 8mg  ODT q4 hours prn nausea 08/12/13  Yes Hannah Muthersbaugh, PA-C  oxyCODONE-acetaminophen (PERCOCET/ROXICET) 5-325 MG per tablet Take 1-2 tablets by mouth every 4 (four) hours as needed for severe pain. 08/12/13  Yes Hannah Muthersbaugh, PA-C    Family History No family  history on file.  Social History Social History  Substance Use Topics  . Smoking status: Never Smoker  . Smokeless tobacco: Never Used  . Alcohol use No     Allergies   Patient has no known allergies.   Review of Systems Review of Systems   Physical Exam Updated Vital Signs BP 120/66   Pulse 82   Temp 99.4 F (37.4 C) (Oral)   Resp 18   Ht 5' 2.5" (1.588 m)   Wt 127.9 kg   LMP 09/29/2016   SpO2 99%   BMI 50.76 kg/m   Physical Exam  Constitutional: She is oriented to person, place, and time. She appears well-developed and well-nourished. No distress.  HENT:  Head: Normocephalic and atraumatic.  Right Ear: External ear normal.  Left Ear: External ear normal.  Nose: Nose normal.  Eyes: Conjunctivae and EOM are normal. Pupils are equal, round, and reactive to light.  Neck: Normal range of motion. Neck supple.  Cardiovascular: Normal rate and regular rhythm.   Pulmonary/Chest: Effort normal.  Abdominal: Soft. Bowel sounds are normal.    Genitourinary:  Genitourinary Comments: Pelvic exam reveals some yellowish discharge in the pelvic vault. She has a firm yellowish object treating from the eyes. Strings to IUD are noted. Cervix is friable but there is no cervical motion tenderness. There is some left sided ovarian tenderness to palpation.  Musculoskeletal: Normal range  of motion.  Neurological: She is alert and oriented to person, place, and time. She exhibits normal muscle tone. Coordination normal.  Skin: Skin is warm and dry.  Psychiatric: She has a normal mood and affect. Her behavior is normal. Thought content normal.  Nursing note and vitals reviewed.    ED Treatments / Results  Labs (all labs ordered are listed, but only abnormal results are displayed) Labs Reviewed  WET PREP, GENITAL - Abnormal; Notable for the following:       Result Value   Trich, Wet Prep PRESENT (*)    Clue Cells Wet Prep HPF POC PRESENT (*)    WBC, Wet Prep HPF POC MANY (*)     All other components within normal limits  COMPREHENSIVE METABOLIC PANEL - Abnormal; Notable for the following:    Glucose, Bld 111 (*)    Calcium 8.5 (*)    Albumin 3.1 (*)    ALT 13 (*)    All other components within normal limits  CBC - Abnormal; Notable for the following:    WBC 13.3 (*)    Hemoglobin 11.9 (*)    HCT 35.5 (*)    All other components within normal limits  URINALYSIS, ROUTINE W REFLEX MICROSCOPIC - Abnormal; Notable for the following:    Hgb urine dipstick SMALL (*)    Leukocytes, UA TRACE (*)    Squamous Epithelial / LPF 0-5 (*)    All other components within normal limits  LIPASE, BLOOD  I-STAT BETA HCG BLOOD, ED ([facility], [facility], [facility] ONLY)  GC/CHLAMYDIA PROBE AMP ([place]) NOT AT [facility]    EKG  EKG Interpretation None       Radiology US Transvaginal Non-ob  Result Date: 10/03/2016 CLINICAL DATA:  Subacute onset of left lower quadrant abdominal pain. Initial encounter. EXAM: TRANSABDOMINAL AND TRANSVAGINAL ULTRASOUND OF PELVIS TECHNIQUE: Both transabdominal and transvaginal ultrasound examinations of the pelvis were performed. Transabdominal technique was performed for global imaging of the pelvis including uterus, ovaries, adnexal regions, and pelvic cul-de-sac. It was necessary to proceed with endovaginal exam following the transabdominal exam to visualize the endometrium in greater detail. COMPARISON:  CT of the abdomen and pelvis performed earlier today at 9:13 p.m. FINDINGS: Uterus Measurements: 8.8 x 5.1 x 5.7 cm. No fibroids or other mass visualized. Endometrium Thickness: 0.7 cm. As noted on CT, the patient's intrauterine device is noted at the lower uterine segment and cervix, with one of the prongs extending across the myometrium. Right ovary Not visualized. Left ovary Measurements: 4.9 x 4.2 x 4.6 cm. There is suggestion of a 3.1 x 1.8 x 2.6 cm cyst at the left ovary, not well characterized due to the patient's habitus. No suspicious adnexal masses are seen.  Other findings No abnormal free fluid. IMPRESSION: 1. Known soft tissue inflammation about the left ovary is not well characterized on ultrasound. Suggestion of 3.1 cm left ovarian cyst. The appearance on prior CT remains concerning for either focal pelvic inflammatory disease or recently ruptured cyst. Would correlate with the patient's symptoms. 2. Intrauterine device again noted at the lower uterine segment and cervix, with one of the prongs extending across the myometrium. OB/GYN consultation is recommended for repositioning. Electronically Signed   By: Garald Balding M.D.   On: 10/03/2016 23:49   US Pelvis Complete  Result Date: 10/03/2016 CLINICAL DATA:  Subacute onset of left lower quadrant abdominal pain. Initial encounter. EXAM: TRANSABDOMINAL AND TRANSVAGINAL ULTRASOUND OF PELVIS TECHNIQUE: Both transabdominal and transvaginal ultrasound examinations of the pelvis  were performed. Transabdominal technique was performed for global imaging of the pelvis including uterus, ovaries, adnexal regions, and pelvic cul-de-sac. It was necessary to proceed with endovaginal exam following the transabdominal exam to visualize the endometrium in greater detail. COMPARISON:  CT of the abdomen and pelvis performed earlier today at 9:13 p.m. FINDINGS: Uterus Measurements: 8.8 x 5.1 x 5.7 cm. No fibroids or other mass visualized. Endometrium Thickness: 0.7 cm. As noted on CT, the patient's intrauterine device is noted at the lower uterine segment and cervix, with one of the prongs extending across the myometrium. Right ovary Not visualized. Left ovary Measurements: 4.9 x 4.2 x 4.6 cm. There is suggestion of a 3.1 x 1.8 x 2.6 cm cyst at the left ovary, not well characterized due to the patient's habitus. No suspicious adnexal masses are seen. Other findings No abnormal free fluid. IMPRESSION: 1. Known soft tissue inflammation about the left ovary is not well characterized on ultrasound. Suggestion of 3.1 cm left ovarian  cyst. The appearance on prior CT remains concerning for either focal pelvic inflammatory disease or recently ruptured cyst. Would correlate with the patient's symptoms. 2. Intrauterine device again noted at the lower uterine segment and cervix, with one of the prongs extending across the myometrium. OB/GYN consultation is recommended for repositioning. Electronically Signed   By: Garald Balding M.D.   On: 10/03/2016 23:49   Ct Abdomen Pelvis W Contrast  Result Date: 10/03/2016 CLINICAL DATA:  Subacute onset of lower abdominal pain and constipation. Initial encounter. EXAM: CT ABDOMEN AND PELVIS WITH CONTRAST TECHNIQUE: Multidetector CT imaging of the abdomen and pelvis was performed using the standard protocol following bolus administration of intravenous contrast. CONTRAST:  141mL ISOVUE-300 IOPAMIDOL (ISOVUE-300) INJECTION 61% COMPARISON:  Abdominal ultrasound performed 08/12/2013 FINDINGS: Lower chest: The visualized lung bases are grossly clear. The visualized portions of the mediastinum are unremarkable. Hepatobiliary: The liver is unremarkable in appearance. The gallbladder is unremarkable in appearance. The common bile duct remains normal in caliber. Pancreas: The pancreas is within normal limits. Spleen: The spleen is unremarkable in appearance. Adrenals/Urinary Tract: The adrenal glands are unremarkable in appearance. A small left renal cyst is noted. There is no evidence of hydronephrosis. No renal or ureteral stones are identified. No perinephric stranding is seen. Stomach/Bowel: The stomach is unremarkable in appearance. The small bowel is within normal limits. The appendix is normal in caliber, without evidence of appendicitis. Minimal soft tissue inflammation is noted about the proximal sigmoid colon, thought to reflect the adjacent ovarian process. Vascular/Lymphatic: The abdominal aorta is unremarkable in appearance. The inferior vena cava is grossly unremarkable. No retroperitoneal  lymphadenopathy is seen. No pelvic sidewall lymphadenopathy is identified. Reproductive: The patient's intrauterine device is noted at the lower uterine segment and cervix, with one of the prongs extending to the left edge of the myometrium. The uterus is otherwise grossly unremarkable. There is asymmetric enlargement of the left ovary, with associated heterogeneity, and diffuse surrounding soft tissue inflammation. This may reflect pelvic inflammatory disease or possibly a recently ruptured cyst. No well defined fluid collection is seen to suggest tubo-ovarian abscess at this time. The right ovary is unremarkable in appearance. Other: No additional soft tissue abnormalities are seen. Musculoskeletal: No acute osseous abnormalities are identified. The visualized musculature is unremarkable in appearance. IMPRESSION: 1. Asymmetric enlargement of the left ovary, with associated heterogeneity, and diffuse surrounding soft tissue inflammation. This may reflect focal pelvic inflammatory disease or possibly a recently ruptured cyst. No well defined fluid collection is seen to  suggest tubo-ovarian abscess at this time. Pelvic ultrasound is recommended for further evaluation, as deemed clinically appropriate. 2. Abnormal position of the patient's intrauterine device, seen at the lower uterine segment and cervix, with one of the prongs extending to the left edge of the myometrium. OB/GYN consultation is recommended for repositioning. Electronically Signed   By: Garald Balding M.D.   On: 10/03/2016 21:33    Procedures Procedures (including critical care time)  Medications Ordered in ED Medications  cefTRIAXone (ROCEPHIN) 1 g in dextrose 5 % 50 mL IVPB (not administered)  azithromycin (ZITHROMAX) tablet 1,000 mg (not administered)  acetaminophen (TYLENOL) tablet 650 mg (650 mg Oral Given 10/03/16 1905)  sodium chloride 0.9 % bolus 1,000 mL (1,000 mLs Intravenous New Bag/Given 10/03/16 2034)  ondansetron (ZOFRAN)  injection 4 mg (4 mg Intravenous Given 10/03/16 2035)  morphine 4 MG/ML injection 2 mg (2 mg Intravenous Given 10/03/16 2035)  iopamidol (ISOVUE-300) 61 % injection (100 mLs  Contrast Given 10/03/16 2108)     Initial Impression / Assessment and Plan / ED Course  I have reviewed the triage vital signs and the nursing notes.  Pertinent labs & imaging results that were available during my care of the patient were reviewed by me and considered in my medical decision making (see chart for details).     CT here reveals that IUD is in poor position and there is inflammation around the left ovary. Patient had GC and Chlamydia as well as wet prep sent. She is receiving Rocephin and Zithromax. I discussed the patient's care with Dr. Harolyn Rutherford. Plan ultrasound. If no tubo-ovarian abscess present, plan doxycycline and metronidazole. The GYN outpatient clinic will call the patient for follow-up next week to have IUD removed. Ultrasound without evidence of tubo ovarian abscess. There is a cyst seen in the ovary. Plan as above. I discussed follow-up and return precautions with patient and she voices understanding. Final Clinical Impressions(s) / ED Diagnoses   Final diagnoses:  PID (acute pelvic inflammatory disease)    New Prescriptions New Prescriptions   DOXYCYCLINE (VIBRAMYCIN) 100 MG CAPSULE    Take 1 capsule (100 mg total) by mouth 2 (two) times daily.   METRONIDAZOLE (FLAGYL) 500 MG TABLET    Take 1 tablet (500 mg total) by mouth 2 (two) times daily.     Pattricia Boss, MD 10/03/16 [phone]

## 2016-10-03 NOTE — ED Notes (Signed)
ED Provider at bedside. 

## 2016-10-03 NOTE — ED Notes (Signed)
Patient transported to CT 

## 2016-10-03 NOTE — ED Triage Notes (Signed)
Pt c/o left sided abdominal pain and inability to pass gas for 2 weeks. Pt tried over the counter medications without relief.

## 2016-10-03 NOTE — ED Notes (Signed)
Patient transported to Ultrasound 

## 2016-10-04 NOTE — ED Notes (Signed)
Pt departed in NAD.  

## 2016-10-05 ENCOUNTER — Encounter: Payer: Self-pay | Admitting: Obstetrics & Gynecology

## 2016-10-05 ENCOUNTER — Ambulatory Visit (INDEPENDENT_AMBULATORY_CARE_PROVIDER_SITE_OTHER): Payer: BC Managed Care – PPO | Admitting: Obstetrics & Gynecology

## 2016-10-05 VITALS — BP 118/39 | HR 95 | Wt 273.2 lb

## 2016-10-05 DIAGNOSIS — Z124 Encounter for screening for malignant neoplasm of cervix: Secondary | ICD-10-CM

## 2016-10-05 DIAGNOSIS — Z1151 Encounter for screening for human papillomavirus (HPV): Secondary | ICD-10-CM

## 2016-10-05 DIAGNOSIS — Z30432 Encounter for removal of intrauterine contraceptive device: Secondary | ICD-10-CM | POA: Diagnosis not present

## 2016-10-05 DIAGNOSIS — Z0000 Encounter for general adult medical examination without abnormal findings: Secondary | ICD-10-CM

## 2016-10-05 LAB — GC/CHLAMYDIA PROBE AMP (~~LOC~~) NOT AT ARMC
CHLAMYDIA, DNA PROBE: NEGATIVE
Neisseria Gonorrhea: NEGATIVE

## 2016-10-05 MED ORDER — NORGESTREL-ETHINYL ESTRADIOL 0.3-30 MG-MCG PO TABS: 1.0000 | ORAL_TABLET | Freq: Every day | ORAL | 11 refills | Status: DC

## 2016-10-05 NOTE — Progress Notes (Signed)
   Subjective:    Patient ID: Vanessa Wilcox, female    DOB: [birth year], 36 y.o.   MRN: [id number]  HPI 36 yo M AA P1 (36 yo son) here today for removal of IUD which has been in for 10 years. She has monthly periods.  She would like to restart OCPs.  She was diagnosed with PID at the ER 37 days ago although culture results are not available. She was treated with IM rochephin and 2 antibiotics. Review of Systems She works for [employer], teaches ELA third grade. Newly wed    Objective:   Physical Exam Pleasant [id number] Breathing, conversing, and ambulating normally The end of the Paragard IUD was visible at the os. I applied gentle traction and the IUD came out, minus one arm. I obtained a pap smear. Vaginal discharge c/w BV     Assessment & Plan:  Contraception- Start OCP, lo ovral prescribed BP check at the [facility] office where she will have the retained IUD arm removed Rec back up method for 2 weeks and any time when on antibiotics Awaiting cervical cultures done 10/03/16 Preventative care- pap smear done today

## 2016-10-07 LAB — CYTOLOGY - PAP
Diagnosis: NEGATIVE
HPV (WINDOPATH): NOT DETECTED

## 2016-10-14 ENCOUNTER — Telehealth: Payer: Self-pay | Admitting: *Deleted

## 2016-10-14 DIAGNOSIS — A599 Trichomoniasis, unspecified: Secondary | ICD-10-CM

## 2016-10-14 MED ORDER — METRONIDAZOLE 500 MG PO TABS: 2000.0000 mg | ORAL_TABLET | Freq: Once | ORAL | 0 refills | Status: AC

## 2016-10-14 NOTE — Telephone Encounter (Signed)
-----   Message from Emily Filbert, MD sent at 10/14/2016 10:43 AM EDT ----- She will need to be treated for trich with flagyl 2 grams po once. Please notify her that her partner also needs treatment. Thanks

## 2016-10-14 NOTE — Telephone Encounter (Signed)
Attempted to call patient to notify of trich. The vm box was full, could not leave a message. Flagyl dose sent to pharmacy. Will try to call later.

## 2016-10-15 ENCOUNTER — Encounter: Payer: Self-pay | Admitting: *Deleted

## 2016-10-15 NOTE — Telephone Encounter (Signed)
Attempted again to call patient. The mailbox is full, unable to leave a message. Will send a letter.

## 2016-10-22 ENCOUNTER — Telehealth: Payer: Self-pay | Admitting: *Deleted

## 2016-10-22 NOTE — Telephone Encounter (Signed)
Attempted calling pt. Call went immediately to voice mail. Left message stating I am returning her call. Please return my call and if she gets nurse line, leave a message stating whether a detailed message can be left on her phone.

## 2016-10-22 NOTE — Telephone Encounter (Signed)
Pt left message stating that she received our letter regarding her test results and prescription called to her pharmacy. She wanted to know if another prescription was called in for her partner.

## 2016-10-23 NOTE — Telephone Encounter (Signed)
Patient returned call, stated she is unable to answer her phone during the day because of work. Yes a detailed message may be left. Please return her call.

## 2016-10-29 NOTE — Telephone Encounter (Signed)
Called patient got voice mail. Left message stating I am returning her call. Apologies for the delay. No a prescription was not sent in for her partner, however he is able to get treatment [facility]. Also they should avoid sexual intercourse until 7-10 days after BOTH have been fully treated. If she has any questions she should feel free to call us again.

## 2016-11-04 ENCOUNTER — Encounter: Payer: Self-pay | Admitting: Obstetrics & Gynecology

## 2016-11-04 ENCOUNTER — Ambulatory Visit (INDEPENDENT_AMBULATORY_CARE_PROVIDER_SITE_OTHER): Payer: BC Managed Care – PPO | Admitting: Obstetrics & Gynecology

## 2016-11-04 VITALS — BP 124/67 | HR 82 | Ht 62.5 in

## 2016-11-04 DIAGNOSIS — T199XXD Foreign body in genitourinary tract, part unspecified, subsequent encounter: Secondary | ICD-10-CM | POA: Diagnosis not present

## 2016-11-04 NOTE — Progress Notes (Signed)
Patient states that IUD was removed in clinic and states "there is still a piece of the IUD left in". Kathrene Alu RN BSN

## 2016-11-04 NOTE — Progress Notes (Signed)
History:  36 y.o. G1P1  here today for consult for hysteroscopic IUD extraction.  Pt reports that her copper T IUD was in place or 11 years.  Dr. Hulan Fray attempted removal on 10/05/2016 but, noted that an arm of the IUD was left in place. Pt is not actively trying to get pregnant but, wants to keep fertility open.  Pt currently on OCPs.  NO issues with OCPs.   The following portions of the patient's history were reviewed and updated as appropriate: allergies, current medications, past family history, past medical history, past social history, past surgical history and problem list.  Review of Systems:  Pertinent items are noted in HPI.   Current Outpatient Prescriptions on File Prior to Visit  Medication Sig Dispense Refill  . doxycycline (VIBRAMYCIN) 100 MG capsule Take 1 capsule (100 mg total) by mouth 2 (two) times daily. 20 capsule 0  . ibuprofen (ADVIL,MOTRIN) 800 MG tablet Take 1 tablet (800 mg total) by mouth every 6 (six) hours as needed for pain. 30 tablet 0  . metroNIDAZOLE (FLAGYL) 500 MG tablet Take 1 tablet (500 mg total) by mouth 2 (two) times daily. 14 tablet 0  . naproxen sodium (ANAPROX) 220 MG tablet Take 220 mg by mouth 2 (two) times daily as needed (pain).     . norgestrel-ethinyl estradiol (LO/OVRAL,CRYSELLE) 0.3-30 MG-MCG tablet Take 1 tablet by mouth daily. 1 Package 11  . ondansetron (ZOFRAN ODT) 8 MG disintegrating tablet 8mg  ODT q4 hours prn nausea 10 tablet 2  . levonorgestrel (MIRENA) 20 MCG/24HR IUD 1 each by Intrauterine route once.    Marland Kitchen oxyCODONE-acetaminophen (PERCOCET/ROXICET) 5-325 MG per tablet Take 1-2 tablets by mouth every 4 (four) hours as needed for severe pain. (Patient not taking: Reported on 11/04/2016) 11 tablet 0   No current facility-administered medications on file prior to visit.       Objective:  Physical Exam Blood pressure 124/67, pulse 82, height 5' 2.5" (1.588 m). BP 124/67   Pulse 82   Ht 5' 2.5" (1.588 m)  CONSTITUTIONAL: Well-developed,  well-nourished female in no acute distress.  HENT:  Normocephalic, atraumatic EYES: Conjunctivae and EOM are normal. No scleral icterus.  NECK: Normal range of motion SKIN: Skin is warm and dry. No rash noted. Not diaphoretic.No pallor. [place]: Alert and oriented to person, place, and time. Normal coordination.    Labs and Imaging 10/04/2015 CLINICAL DATA:  Subacute onset of left lower quadrant abdominal pain. Initial encounter.  EXAM: TRANSABDOMINAL AND TRANSVAGINAL ULTRASOUND OF PELVIS  TECHNIQUE: Both transabdominal and transvaginal ultrasound examinations of the pelvis were performed. Transabdominal technique was performed for global imaging of the pelvis including uterus, ovaries, adnexal regions, and pelvic cul-de-sac. It was necessary to proceed with endovaginal exam following the transabdominal exam to visualize the endometrium in greater detail.  COMPARISON:  CT of the abdomen and pelvis performed earlier today at 9:13 p.m.  FINDINGS: Uterus  Measurements: 8.8 x 5.1 x 5.7 cm. No fibroids or other mass visualized.  Endometrium  Thickness: 0.7 cm. As noted on CT, the patient's intrauterine device is noted at the lower uterine segment and cervix, with one of the prongs extending across the myometrium.  Right ovary  Not visualized.  Left ovary  Measurements: 4.9 x 4.2 x 4.6 cm. There is suggestion of a 3.1 x 1.8 x 2.6 cm cyst at the left ovary, not well characterized due to the patient's habitus. No suspicious adnexal masses are seen.  Other findings  No abnormal free fluid.  IMPRESSION:  1. Known soft tissue inflammation about the left ovary is not well characterized on ultrasound. Suggestion of 3.1 cm left ovarian cyst. The appearance on prior CT remains concerning for either focal pelvic inflammatory disease or recently ruptured cyst. Would correlate with the patient's symptoms. 2. Intrauterine device again noted at the lower uterine  segment and cervix, with one of the prongs extending across the myometrium. OB/GYN consultation is recommended for repositioning.   Assessment & Plan:  Retained IUD  Patient desires surgical management with hysteroscopy with removal of portion of IUD.  The risks of surgery were discussed in detail with the patient including but not limited to: bleeding which may require transfusion or reoperation; infection which may require prolonged hospitalization or re-hospitalization and antibiotic therapy; injury to bowel, bladder, ureters and major vessels or other surrounding organs; need for additional procedures including laparotomy; thromboembolic phenomenon, incisional problems and other postoperative or anesthesia complications.  Patient was told that the likelihood that her condition and symptoms will be treated effectively with this surgical management was very high; the postoperative expectations were also discussed in detail. The patient also understands the alternative treatment options which were discussed in full. All questions were answered.   Total face-to-face time with patient was 15 min.  Greater than 50% was spent in counseling and coordination of care with the patient.   [name] L. Harraway-Smith, M.D., [name]

## 2016-11-04 NOTE — Patient Instructions (Signed)
Hysteroscopy Hysteroscopy is a procedure used for looking inside the womb (uterus). It may be done for various reasons, including:  To evaluate abnormal bleeding, fibroid (benign, noncancerous) tumors, polyps, scar tissue (adhesions), and possibly cancer of the uterus.  To look for lumps (tumors) and other uterine growths.  To look for causes of why a woman cannot get pregnant (infertility), causes of recurrent loss of pregnancy (miscarriages), or a lost intrauterine device (IUD).  To perform a sterilization by blocking the fallopian tubes from inside the uterus. In this procedure, a thin, flexible tube with a tiny light and camera on the end of it (hysteroscope) is used to look inside the uterus. A hysteroscopy should be done right after a menstrual period to be sure you are not pregnant. LET [facility] CARE PROVIDER KNOW ABOUT:  Any allergies you have.  All medicines you are taking, including vitamins, herbs, eye drops, creams, and over-the-counter medicines.  Previous problems you or members of your family have had with the use of anesthetics.  Any blood disorders you have.  Previous surgeries you have had.  Medical conditions you have. RISKS AND COMPLICATIONS Generally, this is a safe procedure. However, as with any procedure, complications can occur. Possible complications include:  Putting a hole in the uterus.  Excessive bleeding.  Infection.  Damage to the cervix.  Injury to other organs.  Allergic reaction to medicines.  Too much fluid used in the uterus for the procedure. BEFORE THE PROCEDURE  Ask your health care provider about changing or stopping any regular medicines.  Do not take aspirin or blood thinners for 1 week before the procedure, or as directed by your health care provider. These can cause bleeding.  If you smoke, do not smoke for 2 weeks before the procedure.  In some cases, a medicine is placed in the cervix the day before the procedure. This  medicine makes the cervix have a larger opening (dilate). This makes it easier for the instrument to be inserted into the uterus during the procedure.  Do not eat or drink anything for at least 8 hours before the surgery.  Arrange for someone to take you home after the procedure. PROCEDURE  You may be given a medicine to relax you (sedative). You may also be given one of the following:  A medicine that numbs the area around the cervix (local anesthetic).  A medicine that makes you sleep through the procedure (general anesthetic).  The hysteroscope is inserted through the vagina into the uterus. The camera on the hysteroscope sends a picture to a TV screen. This gives the surgeon a good view inside the uterus.  During the procedure, air or a liquid is put into the uterus, which allows the surgeon to see better.  Sometimes, tissue is gently scraped from inside the uterus. These tissue samples are sent to a lab for testing. What to expect after the procedure  If you had a general anesthetic, you may be groggy for a couple hours after the procedure.  If you had a local anesthetic, you will be able to go home as soon as you are stable and feel ready.  You may have some cramping. This normally lasts for a couple days.  You may have bleeding, which varies from light spotting for a few days to menstrual-like bleeding for 3-7 days. This is normal.  If your test results are not back during the visit, make an appointment with your health care provider to find out the results. This  results. This information is not intended to replace advice given to you by your health care provider. Make sure you discuss any questions you have with your health care provider. Document Released: 09/28/2000 Document Revised: 11/28/2015 Document Reviewed: 01/19/2013 Elsevier Interactive Patient Education  2017 [employer] Hysteroscopy, Care After Refer to this sheet in the next few weeks. These instructions provide you  with information on caring for yourself after your procedure. Your health care provider may also give you more specific instructions. Your treatment has been planned according to current medical practices, but problems sometimes occur. Call your health care provider if you have any problems or questions after your procedure. What can I expect after the procedure? After your procedure, it is typical to have the following:  You may have some cramping. This normally lasts for a couple days.  You may have bleeding. This can vary from light spotting for a few days to menstrual-like bleeding for 3-7 days.  Follow these instructions at home:  Rest for the first 1-2 days after the procedure.  Only take over-the-counter or prescription medicines as directed by your health care provider. Do not take aspirin. It can increase the chances of bleeding.  Take showers instead of baths for 2 weeks or as directed by your health care provider.  Do not drive for 24 hours or as directed.  Do not drink alcohol while taking pain medicine.  Do not use tampons, douche, or have sexual intercourse for 2 weeks or until your health care provider says it is okay.  Take your temperature twice a day for 4-5 days. Write it down each time.  Follow your health care provider's advice about diet, exercise, and lifting.  If you develop constipation, you may: ? Take a mild laxative if your health care provider approves. ? Add bran foods to your diet. ? Drink enough fluids to keep your urine clear or pale yellow.  Try to have someone with you or available to you for the first 24-48 hours, especially if you were given a general anesthetic.  Follow up with your health care provider as directed. Contact a health care provider if:  You feel dizzy or lightheaded.  You feel sick to your stomach (nauseous).  You have abnormal vaginal discharge.  You have a rash.  You have pain that is not controlled with medicine. Get  help right away if:  You have bleeding that is heavier than a normal menstrual period.  You have a fever.  You have increasing cramps or pain, not controlled with medicine.  You have new belly (abdominal) pain.  You pass out.  You have pain in the tops of your shoulders (shoulder strap areas).  You have shortness of breath. This information is not intended to replace advice given to you by your health care provider. Make sure you discuss any questions you have with your health care provider. Document Released: 04/12/2013 Document Revised: 11/28/2015 Document Reviewed: 01/19/2013 Elsevier Interactive Patient Education  2017 [employer]  

## 2016-11-23 ENCOUNTER — Telehealth: Payer: Self-pay

## 2016-11-23 MED ORDER — DIAZEPAM 5 MG PO TABS: ORAL_TABLET | ORAL | 0 refills | Status: DC

## 2016-11-23 MED ORDER — MISOPROSTOL 200 MCG PO TABS: ORAL_TABLET | ORAL | 0 refills | Status: DC

## 2016-11-23 NOTE — Telephone Encounter (Signed)
Patient called and reviewed hysteroscopy pre procedure instructions with patient. 1. She will need a driver. 2. Patient given script for cytotec and given instructions for taking the night before procedure 3. Patient would like valium for procedure and instructed to NOT take medication but to bring it with her to appointment and will be instructed when to take it.   Patient states understanding. Kathrene Alu RNBSN

## 2016-11-25 ENCOUNTER — Ambulatory Visit (INDEPENDENT_AMBULATORY_CARE_PROVIDER_SITE_OTHER): Payer: BC Managed Care – PPO | Admitting: Obstetrics & Gynecology

## 2016-11-25 ENCOUNTER — Encounter: Payer: Self-pay | Admitting: Obstetrics & Gynecology

## 2016-11-25 VITALS — BP 123/76 | HR 87 | Wt 280.0 lb

## 2016-11-25 DIAGNOSIS — Z01812 Encounter for preprocedural laboratory examination: Secondary | ICD-10-CM

## 2016-11-25 DIAGNOSIS — T8332XS Displacement of intrauterine contraceptive device, sequela: Secondary | ICD-10-CM

## 2016-11-25 DIAGNOSIS — Z3202 Encounter for pregnancy test, result negative: Secondary | ICD-10-CM

## 2016-11-25 DIAGNOSIS — T8332XD Displacement of intrauterine contraceptive device, subsequent encounter: Secondary | ICD-10-CM

## 2016-11-25 DIAGNOSIS — T8389XS Other specified complication of genitourinary prosthetic devices, implants and grafts, sequela: Principal | ICD-10-CM

## 2016-11-25 LAB — POCT URINE PREGNANCY: Preg Test, Ur: NEGATIVE

## 2016-11-25 MED ORDER — DOXYCYCLINE HYCLATE 100 MG PO CAPS: 100.0000 mg | ORAL_CAPSULE | Freq: Two times a day (BID) | ORAL | 0 refills | Status: DC

## 2016-11-25 MED ORDER — KETOROLAC TROMETHAMINE 30 MG/ML IJ SOLN
30.0000 mg | Freq: Once | INTRAMUSCULAR | Status: AC
  Administered 2016-11-25: 30 mg via INTRAMUSCULAR

## 2016-11-25 NOTE — Addendum Note (Signed)
Addended by: Phill Myron on: 11/25/2016 04:19 PM   Modules accepted: Orders

## 2016-11-25 NOTE — Progress Notes (Signed)
INDICATIONS: 36 y.o. No obstetric history on file.  here for scheduled surgery for retained IUD portion.  Pt has had this current IUD for 11 years. She had a part of it removed 10/05/2016 but, it broke of. She is here for removal of the portion of retained IUD.  Risks of surgery were discussed with the patient including but not limited to: bleeding which may require transfusion; infection which may require antibiotics; injury to uterus or surrounding organs; intrauterine scarring which may impair future fertility; need for additional procedures including laparotomy or laparoscopy; and other postoperative/anesthesia complications. Written informed consent was obtained.    FINDINGS:  A 10 week size uterus.  Diffuse proliferative endometrium.  IUD noted embedded o the uterus on the right side.   ANESTHESIA:   General, paracervical block. ESTIMATED BLOOD LOSS:  Less than 20 ml SPECIMENS: T portion of IUD COMPLICATIONS:  None immediate.  PROCEDURE DETAILS:  The patient was taken to the procedure room where she received Toradol 10 mg IM. She also took Valium 10mg  in the waiting area just prior to the procedure and cytotec 426mcg ~ hours prior to the visit.  After an adequate timeout was performed, she was placed in the dorsal lithotomy position and examined; then prepped and draped in the sterile manner.   A speculum was then placed in the patient's vagina.  A paracervical block of 15 of 2% lidocaine with epinephrine was placed with 5cc at both 5 adn 7 o'clock.  A single tooth tenaculum was applied to the anterior lip of the cervix.   A 41mm hysteroscope was inserted under direct visualization using normal saline as a distending medium.  The uterine cavity was carefully examined,and diffusely proliferative endometrium was noted. Th eIUD was noted on the right side embedded in the wall of the uterus. I attempted to remove this with the hysteroscopic graspers without success. I then used a narrow ringed forcep and  removed the T portion of the IUD with no problems. The hysteroscope was reinserted and foujd to be intact with no foreign bodies noted. The hysteroscope was removed. The tenaculum was removed from the anterior lip of the cervix and the vaginal speculum was removed after noting good hemostasis.  The patient tolerated the procedure well.   There were no immediate complications.  The patient will be discharged to home. Routine postoperative instructions given.  She was prescribed doxycycline 100mg  bid x 5 days. She will follow up in the clinic in 4 weeks for postoperative evaluation or sooner prn  Rutger Salton L. Harraway-Smith, M.D., [name]

## 2016-11-25 NOTE — Addendum Note (Signed)
Addended by: Phill Myron on: 11/25/2016 04:10 PM   Modules accepted: Orders

## 2016-11-25 NOTE — Patient Instructions (Signed)
Hysteroscopy, Care After  Refer to this sheet in the next few weeks. These instructions provide you with information on caring for yourself after your procedure. Your health care provider may also give you more specific instructions. Your treatment has been planned according to current medical practices, but problems sometimes occur. Call your health care provider if you have any problems or questions after your procedure.  What can I expect after the procedure?  After your procedure, it is typical to have the following:  · You may have some cramping. This normally lasts for a couple days.  · You may have bleeding. This can vary from light spotting for a few days to menstrual-like bleeding for 3-7 days.    Follow these instructions at home:  · Rest for the first 1-2 days after the procedure.  · Only take over-the-counter or prescription medicines as directed by your health care provider. Do not take aspirin. It can increase the chances of bleeding.  · Take showers instead of baths for 2 weeks or as directed by your health care provider.  · Do not drive for 24 hours or as directed.  · Do not drink alcohol while taking pain medicine.  · Do not use tampons, douche, or have sexual intercourse for 2 weeks or until your health care provider says it is okay.  · Take your temperature twice a day for 4-5 days. Write it down each time.  · Follow your health care provider's advice about diet, exercise, and lifting.  · If you develop constipation, you may:  ? Take a mild laxative if your health care provider approves.  ? Add bran foods to your diet.  ? Drink enough fluids to keep your urine clear or pale yellow.  · Try to have someone with you or available to you for the first 24-48 hours, especially if you were given a general anesthetic.  · Follow up with your health care provider as directed.  Contact a health care provider if:  · You feel dizzy or lightheaded.  · You feel sick to your stomach (nauseous).  · You have  abnormal vaginal discharge.  · You have a rash.  · You have pain that is not controlled with medicine.  Get help right away if:  · You have bleeding that is heavier than a normal menstrual period.  · You have a fever.  · You have increasing cramps or pain, not controlled with medicine.  · You have new belly (abdominal) pain.  · You pass out.  · You have pain in the tops of your shoulders (shoulder strap areas).  · You have shortness of breath.  This information is not intended to replace advice given to you by your health care provider. Make sure you discuss any questions you have with your health care provider.  Document Released: 04/12/2013 Document Revised: 11/28/2015 Document Reviewed: 01/19/2013  Elsevier Interactive Patient Education © 2017 [employer]

## 2016-12-23 ENCOUNTER — Ambulatory Visit: Payer: BC Managed Care – PPO | Admitting: Obstetrics & Gynecology

## 2016-12-30 ENCOUNTER — Ambulatory Visit: Payer: BC Managed Care – PPO | Admitting: Obstetrics & Gynecology

## 2017-07-31 ENCOUNTER — Other Ambulatory Visit: Payer: Self-pay | Admitting: Obstetrics & Gynecology

## 2018-07-31 ENCOUNTER — Other Ambulatory Visit: Payer: Self-pay | Admitting: Obstetrics & Gynecology

## 2019-02-09 ENCOUNTER — Ambulatory Visit (INDEPENDENT_AMBULATORY_CARE_PROVIDER_SITE_OTHER): Payer: BC Managed Care – PPO | Admitting: Bariatrics

## 2019-02-09 ENCOUNTER — Encounter (INDEPENDENT_AMBULATORY_CARE_PROVIDER_SITE_OTHER): Payer: Self-pay | Admitting: Bariatrics

## 2019-02-09 ENCOUNTER — Other Ambulatory Visit: Payer: Self-pay

## 2019-02-09 VITALS — BP 122/88 | HR 87 | Temp 99.0°F | Ht 63.0 in | Wt 306.0 lb

## 2019-02-09 DIAGNOSIS — F3289 Other specified depressive episodes: Secondary | ICD-10-CM | POA: Diagnosis not present

## 2019-02-09 DIAGNOSIS — Z9189 Other specified personal risk factors, not elsewhere classified: Secondary | ICD-10-CM | POA: Diagnosis not present

## 2019-02-09 DIAGNOSIS — E559 Vitamin D deficiency, unspecified: Secondary | ICD-10-CM

## 2019-02-09 DIAGNOSIS — M549 Dorsalgia, unspecified: Secondary | ICD-10-CM

## 2019-02-09 DIAGNOSIS — R0602 Shortness of breath: Secondary | ICD-10-CM

## 2019-02-09 DIAGNOSIS — I10 Essential (primary) hypertension: Secondary | ICD-10-CM

## 2019-02-09 DIAGNOSIS — R5383 Other fatigue: Secondary | ICD-10-CM | POA: Diagnosis not present

## 2019-02-09 DIAGNOSIS — Z0289 Encounter for other administrative examinations: Secondary | ICD-10-CM

## 2019-02-09 DIAGNOSIS — Z6841 Body Mass Index (BMI) 40.0 and over, adult: Secondary | ICD-10-CM

## 2019-02-10 LAB — VITAMIN D 25 HYDROXY (VIT D DEFICIENCY, FRACTURES): Vit D, 25-Hydroxy: 7.6 ng/mL — ABNORMAL LOW (ref 30.0–100.0)

## 2019-02-10 LAB — HEMOGLOBIN A1C
Est. average glucose Bld gHb Est-mCnc: 117 mg/dL
Hgb A1c MFr Bld: 5.7 % — ABNORMAL HIGH (ref 4.8–5.6)

## 2019-02-10 LAB — INSULIN, RANDOM: INSULIN: 21.5 u[IU]/mL (ref 2.6–24.9)

## 2019-02-13 ENCOUNTER — Encounter (INDEPENDENT_AMBULATORY_CARE_PROVIDER_SITE_OTHER): Payer: Self-pay | Admitting: Bariatrics

## 2019-02-13 DIAGNOSIS — I10 Essential (primary) hypertension: Secondary | ICD-10-CM | POA: Insufficient documentation

## 2019-02-13 HISTORY — DX: Essential (primary) hypertension: I10

## 2019-02-13 NOTE — Progress Notes (Signed)
Office: [phone]  /  Fax: [phone]   Dear Dr. Laurann Montana,   Thank you for referring Vanessa Wilcox to our clinic. The following note includes my evaluation and treatment recommendations.  HPI:   Chief Complaint: OBESITY    [name] has been referred by Vanessa Pillar, MD for consultation regarding her obesity and obesity related comorbidities.    Vanessa Wilcox (MR# [id number]) is a 38 y.o. female who presents on 02/09/2019 for obesity evaluation and treatment. Current BMI is Body mass index is 54.21 kg/m.Vanessa Wilcox has been struggling with her weight for many years and has been unsuccessful in either losing weight, maintaining weight loss, or reaching her healthy weight goal.     Vanessa Wilcox attended our information session and states she is currently in the action stage of change and ready to dedicate time achieving and maintaining a healthier weight. Vanessa Wilcox is interested in becoming our patient and working on intensive lifestyle modifications including (but not limited to) diet, exercise and weight loss.    Vanessa Wilcox states her family eats meals together she thinks her family will eat healthier with her her desired weight loss is 116 lbs. she has been heavy most of her life she started gaining weight within the past 4 years her heaviest weight ever was 306 lbs. she states that she does not like to cook she craves sweets she snacks on cookies or "heavy food" she is frequently drinking liquids with calories she frequently makes poor food choices she has problems with excessive hunger  she has binge eating behaviors   Fatigue Vanessa Wilcox feels her energy is lower than it should be. This has worsened with weight gain and has not worsened recently. Vanessa Wilcox admits to daytime somnolence and she admits to waking up still tired. Patient is at risk for obstructive sleep apnea. Patent has a history of symptoms of daytime fatigue, morning fatigue, morning headache and hypertension. Patient generally  gets 5 hours of sleep per night, and states they generally have restless sleep. Snoring is present. Apneic episodes are not present. Epworth Sleepiness Score is 7  Dyspnea on exertion Vanessa Wilcox notes increasing shortness of breath with certain activities and seems to be worsening over time with weight gain. She notes getting out of breath sooner with activity than she used to. This has not gotten worse recently. Vanessa Wilcox denies orthopnea.  Hypertension Chaquana Nichols is a 38 y.o. female with hypertension. She is taking HCTZ. Vanessa Wilcox denies chest pain or shortness of breath on exertion. She is working weight loss to help control her blood pressure with the goal of decreasing her risk of heart attack and stroke. Vanessa Wilcox blood pressure is reasonably well controlled.  At risk for cardiovascular disease Vanessa Wilcox is at a higher than average risk for cardiovascular disease due to obesity and hypertension. She currently denies any chest pain.  Back Pain Vanessa Wilcox has back pain that started about one year ago.  Vitamin D deficiency Vanessa Wilcox has a diagnosis of vitamin D deficiency. She is not currently taking vit D and denies nausea, vomiting or muscle weakness.  Depression with emotional eating behaviors Vanessa Wilcox is struggling with emotional eating and using food for comfort to the extent that it is negatively impacting her health. She often snacks when she is not hungry. Vanessa Wilcox is "over eating" and she is doing nighttime eating. Vanessa Wilcox sometimes feels she is out of control and then feels guilty that she made poor food choices. She is attempting to work on behavior modification techniques to help reduce her emotional  eating. She shows no sign of suicidal or homicidal ideations.  Depression Screen Vanessa Wilcox's Food and Mood (modified PHQ-9) score was  Depression screen PHQ 2/9 02/09/2019  Decreased Interest 3  Down, Depressed, Hopeless 3  PHQ - 2 Score 6  Altered sleeping 3  Tired, decreased energy 3  Change in  appetite 0  Feeling bad or failure about yourself  2  Trouble concentrating 2  Moving slowly or fidgety/restless 2  Suicidal thoughts 1  PHQ-9 Score 19  Difficult doing work/chores Very difficult    ASSESSMENT AND PLAN:  Other fatigue - Plan: EKG 12-Lead, Hemoglobin A1c, Insulin, random  Shortness of breath on exertion  Back pain, unspecified back location, unspecified back pain laterality, unspecified chronicity  Vitamin D deficiency - Plan: VITAMIN D 25 Hydroxy (Vit-D Deficiency, Fractures)  Other depression - with emotional eating  Essential hypertension  At risk for heart disease  Class 3 severe obesity with serious comorbidity and body mass index (BMI) of 50.0 to 59.9 in adult, unspecified obesity type (HCC)  PLAN:  Fatigue Vanessa Wilcox was informed that her fatigue may be related to obesity, depression or many other causes. Labs will be ordered, and in the meanwhile Vanessa Wilcox has agreed to work on diet, exercise and weight loss to help with fatigue. Proper sleep hygiene was discussed including the need for 7-8 hours of quality sleep each night. A sleep study was not ordered based on symptoms and Epworth score.  Dyspnea on exertion Vanessa Wilcox's shortness of breath appears to be obesity related and exercise induced. She has agreed to work on weight loss and gradually increase exercise to treat her exercise induced shortness of breath. If Trace follows our instructions and loses weight without improvement of her shortness of breath, we will plan to refer to pulmonology. We will monitor this condition regularly. Vanessa Wilcox agrees to this plan.  Hypertension We discussed sodium restriction, working on healthy weight loss, and a regular exercise program as the means to achieve improved blood pressure control. Vanessa Wilcox agreed with this plan and agreed to follow up as directed. We will continue to monitor her blood pressure as well as her progress with the above lifestyle modifications. She will  continue her medications as prescribed and will watch for signs of hypotension as she continues her lifestyle modifications.  Cardiovascular risk counseling Money was given extended (15 minutes) coronary artery disease prevention counseling today. She is 38 y.o. female and has risk factors for heart disease including obesity and hypertension. We discussed intensive lifestyle modifications today with an emphasis on specific weight loss instructions and strategies. Pt was also informed of the importance of increasing exercise and decreasing saturated fats to help prevent heart disease.  Back Pain Mavis will do gradual exercise and stretching. She will follow up with our clinic in 2 weeks.  Vitamin D Deficiency Rooney was informed that low vitamin D levels contributes to fatigue and are associated with obesity, breast, and colon cancer. We will check vitamin D level today and she will follow up for routine testing of vitamin D, at least 2-3 times per year.   Depression with Emotional Eating Behaviors We discussed behavior modification techniques today to help Jordi deal with her emotional eating and depression. We will refer patient to Dr. Mallie Mussel our bariatric psychologist.  Depression Screen Arlen had a strongly positive depression screening. Depression is commonly associated with obesity and often results in emotional eating behaviors. We will monitor this closely and work on CBT to help improve the non-hunger eating patterns.  Obesity Lori-Ann is currently in the action stage of change and her goal is to continue with weight loss efforts. I recommend Olympia begin the structured treatment plan as follows:  She has agreed to follow the category 2 plan  +100 calories Yarely has been instructed to eventually work up to a goal of 150 minutes of combined cardio and strengthening exercise per week for weight loss and overall health benefits. We discussed the following Behavioral Modification  Strategies today: will have routine at home, eat until 80 to 90% full, increase H2O intake, decrease soda intake, no skipping meals, keeping healthy foods in the home,  increasing lean protein intake, decreasing simple carbohydrates, increasing vegetables, decrease eating out and work on meal planning and intentional eating   She was informed of the importance of frequent follow up visits to maximize her success with intensive lifestyle modifications for her multiple health conditions. She was informed we would discuss her lab results at her next visit unless there is a critical issue that needs to be addressed sooner. Miosha agreed to keep her next visit at the agreed upon time to discuss these results.  ALLERGIES: No Known Allergies  MEDICATIONS: Current Outpatient Medications on File Prior to Visit  Medication Sig Dispense Refill  . hydrochlorothiazide (HYDRODIURIL) 25 MG tablet Take 25 mg by mouth daily.    Vanessa Kitchen levonorgestrel (MIRENA) 20 MCG/24HR IUD 1 each by Intrauterine route once.     No current facility-administered medications on file prior to visit.     PAST MEDICAL HISTORY: Past Medical History:  Diagnosis Date  . Anxiety   . Back pain   . Depression   . Gestational hypertension   . High blood pressure     PAST SURGICAL HISTORY: Past Surgical History:  Procedure Laterality Date  . ASPIRATION BIOPSY     fluid around heart- negative results in 2000    SOCIAL HISTORY: Social History   Tobacco Use  . Smoking status: Never Smoker  . Smokeless tobacco: Never Used  Substance Use Topics  . Alcohol use: No  . Drug use: No    FAMILY HISTORY: Family History  Problem Relation Age of Onset  . Diabetes Mother   . High blood pressure Mother   . Cancer Mother   . Depression Mother   . Sleep apnea Mother   . Obesity Mother   . High Cholesterol Father   . Cancer Father   . Alcoholism Father     ROS: Review of Systems  Constitutional: Positive for malaise/fatigue.   Respiratory: Positive for shortness of breath.   Cardiovascular: Negative for chest pain and orthopnea.       Positive for Shortness of Breath on exertion Positive for Leg Cramping  Gastrointestinal: Negative for nausea and vomiting.  Musculoskeletal: Positive for back pain.       Negative for muscle weakness  Skin: Positive for itching.  Psychiatric/Behavioral: Positive for depression. Negative for suicidal ideas. The patient has insomnia.        Positive for Stress    PHYSICAL EXAM: Blood pressure 122/88, pulse 87, temperature 99 F (37.2 C), temperature source Oral, height 5\' 3"  (1.6 m), weight (!) 306 lb (138.8 kg), SpO2 98 %. Body mass index is 54.21 kg/m. Physical Exam Vitals signs reviewed.  Constitutional:      Appearance: Normal appearance. She is well-developed. She is obese.  HENT:     Head: Normocephalic and atraumatic.     Nose: Nose normal.  Eyes:     General:  No scleral icterus.    Extraocular Movements: Extraocular movements intact.  Neck:     Musculoskeletal: Normal range of motion and neck supple.     Thyroid: No thyromegaly.  Cardiovascular:     Rate and Rhythm: Normal rate and regular rhythm.  Pulmonary:     Effort: Pulmonary effort is normal. No respiratory distress.  Abdominal:     Palpations: Abdomen is soft.     Tenderness: There is no abdominal tenderness.  Musculoskeletal: Normal range of motion.     Comments: Range of Motion normal in all 4 extremities  Skin:    General: Skin is warm and dry.  Neurological:     Mental Status: She is alert and oriented to person, place, and time.     Coordination: Coordination normal.  Psychiatric:        Mood and Affect: Mood normal.        Behavior: Behavior normal.     RECENT LABS AND TESTS: BMET    Component Value Date/Time   NA 136 10/03/2016 1912   K 3.9 10/03/2016 1912   CL 104 10/03/2016 1912   CO2 22 10/03/2016 1912   GLUCOSE 111 (H) 10/03/2016 1912   BUN 6 10/03/2016 1912   CREATININE  0.66 10/03/2016 1912   CALCIUM 8.5 (L) 10/03/2016 1912   GFRNONAA >60 10/03/2016 1912   GFRAA >60 10/03/2016 1912   Lab Results  Component Value Date   HGBA1C 5.7 (H) 02/09/2019   Lab Results  Component Value Date   INSULIN 21.5 02/09/2019   CBC    Component Value Date/Time   WBC 13.3 (H) 10/03/2016 1912   RBC 4.54 10/03/2016 1912   HGB 11.9 (L) 10/03/2016 1912   HCT 35.5 (L) 10/03/2016 1912   PLT 272 10/03/2016 1912   MCV 78.2 10/03/2016 1912   MCH 26.2 10/03/2016 1912   MCHC 33.5 10/03/2016 1912   RDW 14.2 10/03/2016 1912   LYMPHSABS 2.0 08/11/2013 2247   MONOABS 0.5 08/11/2013 2247   EOSABS 0.1 08/11/2013 2247   BASOSABS 0.0 08/11/2013 2247   Iron/TIBC/Ferritin/ %Sat No results found for: IRON, TIBC, FERRITIN, IRONPCTSAT Lipid Panel  No results found for: CHOL, TRIG, HDL, CHOLHDL, VLDL, LDLCALC, LDLDIRECT Hepatic Function Panel     Component Value Date/Time   PROT 7.0 10/03/2016 1912   ALBUMIN 3.1 (L) 10/03/2016 1912   AST 17 10/03/2016 1912   ALT 13 (L) 10/03/2016 1912   ALKPHOS 56 10/03/2016 1912   BILITOT 0.6 10/03/2016 1912   No results found for: TSH  ECG  shows NSR with a rate of 88 BPM INDIRECT CALORIMETER done today shows a VO2 of 245 and a REE of 1703.  Her calculated basal metabolic rate is 4196 thus her basal metabolic rate is worse than expected.       OBESITY BEHAVIORAL INTERVENTION VISIT  Today's visit was # 1   Starting weight: 306 lbs Starting date: 02/09/2019 Today's weight : 306 lbs  Today's date: 02/13/2019 Total lbs lost to date: 0    02/09/2019  Height 5\' 3"  (1.6 m)  Weight 306 lb (138.8 kg) (A)  BMI (Calculated) 54.22  BLOOD PRESSURE - SYSTOLIC 222  BLOOD PRESSURE - DIASTOLIC 88  Waist Measurement  52 inches   Body Fat % 55.3 %  Total Body Water (lbs) 98 lbs  RMR 1703    ASK: We discussed the diagnosis of obesity with Johny Shock today and Treacy agreed to give [name] permission to discuss obesity behavioral modification  therapy today.  ASSESS: Kelvin has the diagnosis of obesity and her BMI today is 54.22 Pansy is in the action stage of change   ADVISE: Mckaylie was educated on the multiple health risks of obesity as well as the benefit of weight loss to improve her health. She was advised of the need for long term treatment and the importance of lifestyle modifications to improve her current health and to decrease her risk of future health problems.  AGREE: Multiple dietary modification options and treatment options were discussed and  Sarh agreed to follow the recommendations documented in the above note.  ARRANGE: Cecia was educated on the importance of frequent visits to treat obesity as outlined per CMS and USPSTF guidelines and agreed to schedule her next follow up appointment today.  Corey Skains, am acting as Location manager for [employer]. Owens Shark, DO  I have reviewed the above documentation for accuracy and completeness, and I agree with the above. -Jearld Lesch, DO

## 2019-02-14 NOTE — Progress Notes (Signed)
Office: [phone]  /  Fax: [phone]    Date: February 15, 2019   Appointment Start Time: 11:00am Duration: 46 minutes Provider: Glennie Isle, Psy.D. Type of Session: Intake for Individual Therapy  Location of Patient: Home Location of Provider: [facility] Type of Contact: Telepsychological Visit via [employer] Web[employer]  Informed Consent: Prior to proceeding with today's appointment, two pieces of identifying information were obtained from Vanessa Wilcox to verify identity. In addition, Providence's physical location at the time of this appointment was obtained. [name] reported she was at home and provided the address. In the event of technical difficulties, [name] shared a phone number she could be reached at. [name] and this provider participated in today's telepsychological service. Also, [name] denied anyone else being present in the room or on the WebEx appointment.   The provider's role was explained to [name]. The provider reviewed and discussed issues of confidentiality, privacy, and limits therein (e.g., reporting obligations). In addition to verbal informed consent, written informed consent for psychological services was obtained from [facility] prior to the initial intake interview. Written consent included information concerning the practice, financial arrangements, and confidentiality and patients' rights. Since the clinic is not a 24/7 crisis center, mental health emergency resources were shared, and the provider explained MyChart, e-mail, voicemail, and/or other messaging systems should be utilized only for non-emergency reasons. This provider also explained that information obtained during appointments will be placed in [name]'s medical record in a confidential manner and relevant information will be shared with other providers at [facility] that she meets with for coordination of care. [name] verbally acknowledged understanding of the aforementioned, and  agreed to use mental health emergency resources discussed if needed. Moreover, [name] agreed information may be shared with other [facility] providers as needed for coordination of care. By signing the service agreement document, [name] provided written consent for coordination of care.   Prior to initiating telepsychological services, [name] was provided with an informed consent document, which included the development of a safety plan (i.e., an emergency contact and emergency resources) in the event of an emergency/crisis. [name] expressed understanding of the rationale of the safety plan and provided consent for this provider to reach out to her emergency contact in the event of an emergency/crisis. [name] returned the completed consent form prior to today's appointment. This provider verbally reviewed the consent form during today's appointment prior to proceeding with the appointment. [name] verbally acknowledged understanding that she is ultimately responsible for understanding her insurance benefits as it relates to reimbursement of telepsychological and in-person services. This provider also reviewed confidentiality, as it relates to telepsychological services, as well as the rationale for telepsychological services. More specifically, this provider's clinic is limiting in-person visits due to COVID-19. Therapeutic services will resume to in-person appointments once deemed appropriate. [name] expressed understanding regarding the rationale for telepsychological services. In addition, this provider explained the telepsychological services informed consent document would be considered an addendum to the initial consent document/service agreement. [name] verbally consented to proceed.   Chief Complaint/HPI: [name] was referred by [name] due to depression with emotional eating behaviors. Per the note for the initiala visit with [name] on February 09, 2019, "[name] is struggling  with emotional eating and using food for comfort to the extent that it is negatively impacting her health. She often snacks when she is not hungry. [name] is "over eating" and she is doing nighttime eating. [name] sometimes feels she is out of  control and then feels guilty that she made poor food choices. She is attempting to work on behavior modification techniques to help reduce her emotional eating. She shows no sign of suicidal or homicidal ideations."  [name] reported experiencing the following: frequently drinking liquids with calories, frequently making poor food choices, binge eating behaviors, having problems with excessive hunger and snacking on cookies or "heavy food".   During today's appointment, [name] reported she gained 12 pounds during the course of the pandemic. [name] was verbally administered a questionnaire assessing various behaviors related to emotional eating. [name] endorsed the following: experience food cravings on a regular basis and not worry about what you eat when you are in a good mood. She shared she does not experience specific food cravings, but shared she enjoys candy. She added, "I crave food at night." In addition, [name] denied a history of binge eating. She also shared she sometimes skips meals due to not feeling hungry or being busy, and occassionally eats larger portions when she has skipped meals during the day. [name] denied a history of restricting food intake, purging and engagement in other compensatory strategies, and has never been diagnosed with an eating disorder. She also denied a history of treatment for emotional eating. Furthermore, [name] endorsed other problems of concern. More specifically, ongoing work stressors.   Mental Status Examination:  Appearance: neat Behavior: cooperative Mood: euthymic Affect: mood congruent Speech: normal in rate, volume, and tone Eye Contact: appropriate Psychomotor Activity: appropriate Thought Process: linear,  logical, and goal directed  Content/Perceptual Disturbances: denies suicidal and homicidal ideation, plan, and intent and no hallucinations, delusions, bizarre thinking or behavior reported or observed Orientation: time, person, place and purpose of appointment Cognition/Sensorium: memory, attention, language, and fund of knowledge intact  Insight: fair Judgment: fair  Family & Psychosocial History: Darrin reported she is married and she has four children (ages 20, 37, 84, and 28; 57 step-children and 1 biological son). 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 Additionally, Abri shared her highest level of education obtained is a bachelor's degree. Currently, Serenah's social support system consists of her husband, biological son, sisters, and Medical laboratory scientific officer. Moreover, Herlinda stated she resides with her husband.  Medical History:  Past Medical History:  Diagnosis Date   Anxiety    Back pain    Depression    Gestational hypertension    High blood pressure    Past Surgical History:  Procedure Laterality Date   ASPIRATION BIOPSY     fluid around heart- negative results in 2000   Current Outpatient Medications on File Prior to Visit  Medication Sig Dispense Refill   hydrochlorothiazide (HYDRODIURIL) 25 MG tablet Take 25 mg by mouth daily.     levonorgestrel (MIRENA) 20 MCG/24HR IUD 1 each by Intrauterine route once.     No current facility-administered medications on file prior to visit.   Kenyanna denied a history of head injuries and loss of consciousness.    Mental Health History: Hildred reported meeting with a therapist approximately 6 months ago for "emotional stress." Their last appointment was approximately one month ago. Misheel shared a plan to re-initiate services. Her therapist is Tora Perches, MS, LPC at [facility]. Alyanna is unsure when she will schedule a follow-up appointment. She was receptive to informing Ms. Owens Shark she is meeting with  this provider and agreed to sign an authorization for coordination of care if deemed necessary. Francie denied a history of hospitalizations for psychiatric concerns, and has never met with  a psychiatrist. Shylin has never been prescribed psychotropic medications; however, she noted she was recently prescribed hydrochlorothiazide for sleep concerns by her PCP. Alleyah endorsed a family history of mental health related concerns. More specifically, she shared her niece is diagnosed with schizophrenia. Sitara denied a trauma history, including psychological, physical  and sexual abuse, as well as neglect.   Tate described her typical mood as "doing better." Aside from concerns noted above and endorsed on the PHQ-9 and GAD-7, Aubreigh reported experiencing decreased self-esteem due to weight and worry thoughts about work. Kaisey denied current alcohol use. She denied tobacco use. She denied illicit/recreational substance use. She also denied caffeine intake. Furthermore, Charity denied experiencing the following: hopelessness, hallucinations and delusions, paranoia, mania, crying spells, panic attacks and decreased motivation. She added her last crying spell was approximately a "month and a half ago." She also denied current suicidal ideation, plan, and intent; history of and current homicidal ideation, plan, and intent; and history of and current engagement in self-harm.  Regarding suicidal ideation, Neysha described first having thoughts such as "What if I wasn't here?" during her teenage years. She noted the last time she experienced suicidal ideation as described above was "4 or 5 years ago." Chia explained she experienced passive suicidal ideation secondary to relationship issues with different family members (e.g., son, husband) and job stressors. Moreover, she denied a history of experiencing plan and intent and denied a history of suicide attempts. Notably, Lajuan endorsed item 9 (i.e., "Do you feel that your  weight problem is so hopeless that sometimes life doesn't seem worth living?") on the modified PHQ-9 during her initial appointment with [name] on February 09, 2019. She explained she endorsed that item due to hopelessness about her weight and noted, "Not suicidal." The following protective factors were identified for Brenlyn: children, school children, siblings, father, and husband. If she were to become overwhelmed in the future, which is a sign that a crisis may occur, she identified the following coping skills she could engage in: spend time with friends, pray, and attend church. It was recommended the aforementioned be written down and developed into a coping card for future reference. She was observed writing. Psychoeducation regarding the importance of reaching out to a trusted individual and/or utilizing emergency resources if there is a change in emotional status and/or there is an inability to ensure safety was provided. Geralene's confidence in reaching out to a trusted individual and/or utilizing emergency resources should there be an intensification in emotional status and/or there is an inability to ensure safety was assessed on a scale of one to ten where one is not confident and ten is extremely confident. She reported her confidence is a 10. Additionally, Lauralynn denied current access to firearms and/or weapons.   The following strengths were reported by Seth Bake: getting things done, helpful, and good listener. The following strengths were observed by this provider: ability to express thoughts and feelings during the therapeutic session, ability to establish and benefit from a therapeutic relationship, ability to learn and practice coping skills, willingness to work toward established goal(s) with the clinic and ability to engage in reciprocal conversation.  Legal History: Jake denied a history of legal involvement.   Structured Assessment Results: The Patient Health Questionnaire-9 (PHQ-9)  is a self-report measure that assesses symptoms and severity of depression over the course of the last two weeks. Rumor obtained a score of 10 suggesting moderate depression. Suzzane finds the endorsed symptoms to be somewhat difficult. Little interest or  pleasure in doing things 0  Feeling down, depressed, or hopeless 0  Trouble falling or staying asleep, or sleeping too much 3  Feeling tired or having little energy 1  Poor appetite or overeating 2  Feeling bad about yourself --- or that you are a failure or have let yourself or your family down 2  Trouble concentrating on things, such as reading the newspaper or watching television 0  Moving or speaking so slowly that other people could have noticed? Or the opposite --- being so fidgety or restless that you have been moving around a lot more than usual 2  Thoughts that you would be better off dead or hurting yourself in some way 0  PHQ-9 Score 10    The Generalized Anxiety Disorder-7 (GAD-7) is a brief self-report measure that assesses symptoms of anxiety over the course of the last two weeks. Rogue obtained a score of 6 suggesting mild anxiety. Everette finds the endorsed symptoms to be somewhat difficult. Feeling nervous, anxious, on edge 0  Not being able to stop or control worrying 2  Worrying too much about different things 1  Trouble relaxing 2  Being so restless that it's hard to sit still 1  Becoming easily annoyed or irritable 0  Feeling afraid as if something awful might happen 0  GAD-7 Score 6   Interventions: A chart review was conducted prior to the clinical intake interview. The PHQ-9, and GAD-7 were verbally administered as well as a Mood and Food questionnaire to assess various behaviors related to emotional eating. Throughout session, empathic reflections and validation was provided. A risk was completed and a coping card was developed. Continuing treatment with this provider was discussed and a treatment goal was established.  Psychoeducation regarding emotional versus physical hunger was provided. Haizley was sent a handout via e-mail to utilize between now and the next appointment to increase awareness of hunger patterns and subsequent eating. Addisynn provided verbal consent during today's appointment for this provider to send the handout via e-mail.   Provisional DSM-5 Diagnosis: 311 (F32.8) Other Specified Depressive Disorder, Emotional Eating Behaviors  Plan: Shalom appears able and willing to participate as evidenced by collaboration on a treatment goal, engagement in reciprocal conversation, and asking questions as needed for clarification. The next appointment will be scheduled in three weeks, which will be via [employer]. The following treatment goal was established: increase coping skills. For the aforementioned goal, Dene can benefit from individual therapy sessions that are brief in duration for approximately four to six sessions. The treatment modality will be individual therapeutic services, including an eclectic therapeutic approach utilizing techniques from Cognitive Behavioral Therapy, Patient Centered Therapy, Dialectical Behavior Therapy, Acceptance and Commitment Therapy, Interpersonal Therapy, and Cognitive Restructuring. Therapeutic approach will include various interventions as appropriate, such as validation, support, mindfulness, thought defusion, reframing, psychoeducation, values assessment, and role playing. This provider will regularly review the treatment plan and medical chart to keep informed of status changes. Zoi expressed understanding and agreement with the initial treatment plan of care.

## 2019-02-15 ENCOUNTER — Other Ambulatory Visit: Payer: Self-pay

## 2019-02-15 ENCOUNTER — Ambulatory Visit (INDEPENDENT_AMBULATORY_CARE_PROVIDER_SITE_OTHER): Payer: BC Managed Care – PPO | Admitting: Psychology

## 2019-02-15 DIAGNOSIS — F3289 Other specified depressive episodes: Secondary | ICD-10-CM | POA: Diagnosis not present

## 2019-02-23 ENCOUNTER — Other Ambulatory Visit: Payer: Self-pay

## 2019-02-23 ENCOUNTER — Encounter (INDEPENDENT_AMBULATORY_CARE_PROVIDER_SITE_OTHER): Payer: Self-pay | Admitting: Bariatrics

## 2019-02-23 ENCOUNTER — Ambulatory Visit (INDEPENDENT_AMBULATORY_CARE_PROVIDER_SITE_OTHER): Payer: BC Managed Care – PPO | Admitting: Bariatrics

## 2019-02-23 VITALS — BP 134/81 | HR 101 | Temp 98.6°F | Ht 63.0 in | Wt 302.0 lb

## 2019-02-23 DIAGNOSIS — E559 Vitamin D deficiency, unspecified: Secondary | ICD-10-CM | POA: Diagnosis not present

## 2019-02-23 DIAGNOSIS — F3289 Other specified depressive episodes: Secondary | ICD-10-CM | POA: Diagnosis not present

## 2019-02-23 DIAGNOSIS — R7303 Prediabetes: Secondary | ICD-10-CM

## 2019-02-23 DIAGNOSIS — I10 Essential (primary) hypertension: Secondary | ICD-10-CM

## 2019-02-23 DIAGNOSIS — Z9189 Other specified personal risk factors, not elsewhere classified: Secondary | ICD-10-CM | POA: Diagnosis not present

## 2019-02-23 DIAGNOSIS — Z6841 Body Mass Index (BMI) 40.0 and over, adult: Secondary | ICD-10-CM

## 2019-02-23 MED ORDER — VITAMIN D (ERGOCALCIFEROL) 1.25 MG (50000 UNIT) PO CAPS: 50000.0000 [IU] | ORAL_CAPSULE | ORAL | 0 refills | Status: DC

## 2019-02-28 NOTE — Progress Notes (Signed)
Office: [phone]  /  Fax: [phone]   HPI:   Chief Complaint: OBESITY Vanessa Wilcox is here to discuss her progress with her obesity treatment plan. She is on the Category 2 plan +100 calories and is following her eating plan approximately 35 % of the time. She states she is exercising 0 minutes 0 times per week. Vanessa Wilcox is down 4 pounds. 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 She is up to 4 bottles of water. She loves her plan. Vanessa Wilcox is sleeping better. Her weight is (!) 302 lb (137 kg) today and has had a weight loss of 4 pounds over a period of 2 weeks since her last visit. She has lost 4 lbs since starting treatment with [name].  Hypertension Chrysa Cozad is a 38 y.o. female with hypertension. Her blood pressure is 134/81 today. She is taking HCTZ. Kerigan Angelica denies chest pain or shortness of breath on exertion. She is working weight loss to help control her blood pressure with the goal of decreasing her risk of heart attack and stroke. [name] blood pressure is currently controlled.  Vitamin D deficiency Vanessa Wilcox has a diagnosis of vitamin D deficiency. Her last vitamin D level was at 7.6 She is not currently taking vit D and denies nausea, vomiting or muscle weakness.  At risk for osteopenia and osteoporosis Vanessa Wilcox is at higher risk of osteopenia and osteoporosis due to vitamin D deficiency.   Pre-Diabetes Vanessa Wilcox has a diagnosis of prediabetes based on her elevated Hgb A1c and was informed this puts her at greater risk of developing diabetes. Her last A1c was at 5.7 and last insulin level was at 21.5 She is not taking metformin currently and continues to work on diet and exercise to decrease risk of diabetes. She denies polyphagia.  Depression with emotional eating behaviors Vanessa Wilcox is struggling with emotional eating and using food for comfort to the extent that it is negatively impacting her health. She often snacks when she is not hungry. Vanessa Wilcox sometimes feels  she is out of control and then feels guilty that she made poor food choices. She is attempting to work on behavior modification techniques to help reduce her emotional eating. She shows no sign of suicidal or homicidal ideations.  ASSESSMENT AND PLAN:  Essential hypertension  Vitamin D deficiency - Plan: Vitamin D, Ergocalciferol, (DRISDOL) 1.25 MG (50000 UT) CAPS capsule  Prediabetes  Other depression - with emotional eating   At risk for osteoporosis  Class 3 severe obesity with serious comorbidity and body mass index (BMI) of 50.0 to 59.9 in adult, unspecified obesity type ([place])  PLAN:  Hypertension We discussed sodium restriction, working on healthy weight loss, and a regular exercise program as the means to achieve improved blood pressure control. Vanessa Wilcox agreed with this plan and agreed to follow up as directed. We will continue to monitor her blood pressure as well as her progress with the above lifestyle modifications. She will continue her medications as prescribed and will watch for signs of hypotension as she continues her lifestyle modifications.  Vitamin D Deficiency Vanessa Wilcox was informed that low vitamin D levels contributes to fatigue and are associated with obesity, breast, and colon cancer. She agrees to start prescription Vit D @50 ,000 IU every week #4 with no refills  and she will follow up for routine testing of vitamin D, at least 2-3 times per year. She was informed of the risk of over-replacement of vitamin D and agrees to not increase her dose unless she discusses  this with [name] first. Vanessa Wilcox agrees to follow up as directed.  At risk for osteopenia and osteoporosis Vanessa Wilcox was given extended  (15 minutes) osteoporosis prevention counseling today. Vanessa Wilcox is at risk for osteopenia and osteoporosis due to her vitamin D deficiency. She was encouraged to take her vitamin D and follow her higher calcium diet and increase strengthening exercise to help strengthen her bones and  decrease her risk of osteopenia and osteoporosis.  Pre-Diabetes Vanessa Wilcox will continue to work on weight loss, exercise, increasing lean protein and decreasing simple carbohydrates in her diet to help decrease the risk of diabetes. She was informed that eating too many simple carbohydrates or too many calories at one sitting increases the likelihood of GI side effects.  Nakeshia agreed to follow up with [name] as directed to monitor her progress.  Depression with Emotional Eating Behaviors We discussed behavior modification techniques today to help [place] deal with her emotional eating and decrease stress. She will follow up as directed.  Obesity Vanessa Wilcox is currently in the action stage of change. As such, her goal is to continue with weight loss efforts She has agreed to follow the Category 2 plan +100 calories We discussed the following Behavioral Modification Strategies today: planning for success, increase H2O intake, no skipping meals, keeping healthy food in the home, better snacking choices, increasing lean protein intake, decreasing simple carbohydrates , increasing vegetables, decrease eating out, work on meal planning and easy cooking plans, dealing with family or coworker sabotage and travel eating strategies   Vanessa Wilcox has agreed to follow up with our clinic in 2 weeks. She was informed of the importance of frequent follow up visits to maximize her success with intensive lifestyle modifications for her multiple health conditions.  ALLERGIES: No Known Allergies  MEDICATIONS: Current Outpatient Medications on File Prior to Visit  Medication Sig Dispense Refill  . hydrochlorothiazide (HYDRODIURIL) 25 MG tablet Take 25 mg by mouth daily.    Marland Kitchen levonorgestrel (MIRENA) 20 MCG/24HR IUD 1 each by Intrauterine route once.     No current facility-administered medications on file prior to visit.     PAST MEDICAL HISTORY: Past Medical History:  Diagnosis Date  . Anxiety   . Back pain   .  Depression   . Gestational hypertension   . High blood pressure     PAST SURGICAL HISTORY: Past Surgical History:  Procedure Laterality Date  . ASPIRATION BIOPSY     fluid around heart- negative results in 2000    SOCIAL HISTORY: Social History   Tobacco Use  . Smoking status: Never Smoker  . Smokeless tobacco: Never Used  Substance Use Topics  . Alcohol use: No  . Drug use: No    FAMILY HISTORY: Family History  Problem Relation Age of Onset  . Diabetes Mother   . High blood pressure Mother   . Cancer Mother   . Depression Mother   . Sleep apnea Mother   . Obesity Mother   . High Cholesterol Father   . Cancer Father   . Alcoholism Father     ROS: Review of Systems  Constitutional: Positive for weight loss.  Respiratory: Negative for shortness of breath (on exertion).   Cardiovascular: Negative for chest pain.  Gastrointestinal: Negative for nausea and vomiting.  Musculoskeletal:       Negative for muscle weakness  Endo/Heme/Allergies:       Negative for polyphagia  Psychiatric/Behavioral: Positive for depression. Negative for suicidal ideas.    PHYSICAL EXAM: Blood pressure 134/81, pulse Marland Kitchen)  101, temperature 98.6 F (37 C), temperature source Oral, height 5\' 3"  (1.6 m), weight (!) 302 lb (137 kg), SpO2 98 %. Body mass index is 53.5 kg/m. Physical Exam Vitals signs reviewed.  Constitutional:      Appearance: Normal appearance. She is well-developed. She is obese.  Cardiovascular:     Rate and Rhythm: Normal rate.  Pulmonary:     Effort: Pulmonary effort is normal.  Musculoskeletal: Normal range of motion.  Skin:    General: Skin is warm and dry.  Neurological:     Mental Status: She is alert and oriented to person, place, and time.  Psychiatric:        Mood and Affect: Mood normal.        Behavior: Behavior normal.        Thought Content: Thought content does not include homicidal or suicidal ideation.     RECENT LABS AND TESTS: BMET     Component Value Date/Time   NA 136 10/03/2016 1912   K 3.9 10/03/2016 1912   CL 104 10/03/2016 1912   CO2 22 10/03/2016 1912   GLUCOSE 111 (H) 10/03/2016 1912   BUN 6 10/03/2016 1912   CREATININE 0.66 10/03/2016 1912   CALCIUM 8.5 (L) 10/03/2016 1912   GFRNONAA >60 10/03/2016 1912   GFRAA >60 10/03/2016 1912   Lab Results  Component Value Date   HGBA1C 5.7 (H) 02/09/2019   Lab Results  Component Value Date   INSULIN 21.5 02/09/2019   CBC    Component Value Date/Time   WBC 13.3 (H) 10/03/2016 1912   RBC 4.54 10/03/2016 1912   HGB 11.9 (L) 10/03/2016 1912   HCT 35.5 (L) 10/03/2016 1912   PLT 272 10/03/2016 1912   MCV 78.2 10/03/2016 1912   MCH 26.2 10/03/2016 1912   MCHC 33.5 10/03/2016 1912   RDW 14.2 10/03/2016 1912   LYMPHSABS 2.0 08/11/2013 2247   MONOABS 0.5 08/11/2013 2247   EOSABS 0.1 08/11/2013 2247   BASOSABS 0.0 08/11/2013 2247   Iron/TIBC/Ferritin/ %Sat No results found for: IRON, TIBC, FERRITIN, IRONPCTSAT Lipid Panel  No results found for: CHOL, TRIG, HDL, CHOLHDL, VLDL, LDLCALC, LDLDIRECT Hepatic Function Panel     Component Value Date/Time   PROT 7.0 10/03/2016 1912   ALBUMIN 3.1 (L) 10/03/2016 1912   AST 17 10/03/2016 1912   ALT 13 (L) 10/03/2016 1912   ALKPHOS 56 10/03/2016 1912   BILITOT 0.6 10/03/2016 1912   No results found for: TSH    Ref. Range 02/09/2019 09:20  Vitamin D, 25-Hydroxy Latest Ref Range: 30.0 - 100.0 ng/mL 7.6 (L)    OBESITY BEHAVIORAL INTERVENTION VISIT  Today's visit was # 2   Starting weight: 306 lbs Starting date: 02/09/2019 Today's weight : 302 lbs Today's date: 02/23/2019 Total lbs lost to date: 4    02/23/2019  Height 5\' 3"  (1.6 m)  Weight 302 lb (137 kg) (A)  BMI (Calculated) 53.51  BLOOD PRESSURE - SYSTOLIC [id number]  BLOOD PRESSURE - DIASTOLIC 81  Total Body Water (lbs) 55.6 lbs  RMR 99.4    ASK: We discussed the diagnosis of obesity with Johny Shock today and Deleta agreed to give [name] permission to  discuss obesity behavioral modification therapy today.  ASSESS: Brookelle has the diagnosis of obesity and her BMI today is 53.51 Antara is in the action stage of change   ADVISE: Anneleise was educated on the multiple health risks of obesity as well as the benefit of weight loss to improve her health.  She was advised of the need for long term treatment and the importance of lifestyle modifications to improve her current health and to decrease her risk of future health problems.  AGREE: Multiple dietary modification options and treatment options were discussed and  Kwan agreed to follow the recommendations documented in the above note.  ARRANGE: Chade was educated on the importance of frequent visits to treat obesity as outlined per CMS and USPSTF guidelines and agreed to schedule her next follow up appointment today.  Corey Skains, am acting as Location manager for [employer]. Owens Shark, DO  I have reviewed the above documentation for accuracy and completeness, and I agree with the above. -Jearld Lesch, DO

## 2019-03-01 ENCOUNTER — Encounter (INDEPENDENT_AMBULATORY_CARE_PROVIDER_SITE_OTHER): Payer: Self-pay | Admitting: Bariatrics

## 2019-03-01 NOTE — Progress Notes (Signed)
Office: [phone]  /  Fax: [phone]    Date: March 07, 2019   Appointment Start Time: 2:30pm Duration: 27 minutes Provider: Glennie Isle, Psy.D. Type of Session: Individual Therapy  Location of Patient: Work Biomedical scientist of Provider: [facility] Type of Contact: Telepsychological Visit via [employer]- Audio Only  Session Content: Vanessa Wilcox is a 38 y.o. female presenting via [employer] for a follow-up appointment to address the previously established treatment goal of increasing coping skills. Due to connection issues, Vanessa Wilcox was only able to join the [employer] appointment with audio capabilities. Thus, two pieces of identifying information were obtained from Vanessa Wilcox to verify identity. Today's appointment was a telepsychological visit, as this provider's clinic is seeing a limited number of patients for in-person visits due to COVID-19. Therapeutic services will resume to in-person appointments once deemed appropriate. Vanessa Wilcox expressed understanding regarding the rationale for telepsychological services, and provided verbal consent for today's appointment. Prior to proceeding with today's appointment, Vanessa Wilcox's physical location at the time of this appointment was obtained. Vanessa Wilcox reported she was at work and provided the address. In the event of technical difficulties, Vanessa Wilcox shared a phone number she could be reached at. Vanessa Wilcox and this provider participated in today's telepsychological service. Also, Yanely denied anyone else being present in the room or on the WebEx appointment.  This provider conducted a brief check-in and verbally administered the PHQ-9 and GAD-7. Vanessa Wilcox shared, "My diet is not quite where it needs to be." She described fluctuations in her appetite. However, she discussed taking time for herself. This was positively reinforced. Based on the aforementioned, psychoeducation regarding triggers for emotional eating was provided. Vanessa Wilcox was provided a handout,  and encouraged to utilize the handout between now and the next appointment to increase awareness of triggers and frequency. Vanessa Wilcox agreed. This provider also discussed behavioral strategies for specific triggers, such as placing the utensil down when conversing to avoid mindless eating. Vanessa Wilcox provided verbal consent during today's appointment for this provider to send the handout for triggers via e-mail. This provider also encouraged Vanessa Wilcox to focus on her protein intake; she agreed. Vanessa Wilcox was receptive to today's session as evidenced by openness to sharing, responsiveness to feedback, and willingness to identify triggers for emotional eating.  Mental Status Examination:  Appearance: unable to assess Behavior: cooperative Mood: euthymic Affect: unable to fully assess Speech: normal in rate, volume, and tone Eye Contact: unable to assess Psychomotor Activity: unable to assess Thought Process: linear, logical, and goal directed  Content/Perceptual Disturbances: no hallucinations, delusions, bizarre thinking or behavior reported or observed and no evidence of suicidal and homicidal ideation, plan, and intent Orientation: time, person, place and purpose of appointment Cognition/Sensorium: memory, attention, language, and fund of knowledge intact  Insight: good Judgment: good  Structured Assessment Results: The Patient Health Questionnaire-9 (PHQ-9) is a self-report measure that assesses symptoms and severity of depression over the course of the last two weeks. Vanessa Wilcox obtained a score of 4 suggesting minimal depression. Vanessa Wilcox finds the endorsed symptoms to be not difficult at all. Little interest or pleasure in doing things 1  Feeling down, depressed, or hopeless 0  Trouble falling or staying asleep, or sleeping too much 0  Feeling tired or having little energy 0  Poor appetite or overeating 3  Feeling bad about yourself --- or that you are a failure or have let yourself or your family down 0   Trouble concentrating on things, such as reading the newspaper or watching television 0  Moving or  speaking so slowly that other people could have noticed? Or the opposite --- being so fidgety or restless that you have been moving around a lot more than usual 0  Thoughts that you would be better off dead or hurting yourself in some way 0  PHQ-9 Score 4    The Generalized Anxiety Disorder-7 (GAD-7) is a brief self-report measure that assesses symptoms of anxiety over the course of the last two weeks. Vanessa Wilcox obtained a score of 3 suggesting minimal anxiety. Vanessa Wilcox finds the endorsed symptoms to be not difficult at all. Feeling nervous, anxious, on edge 0  Not being able to stop or control worrying 1  Worrying too much about different things 2  Trouble relaxing 0  Being so restless that it's hard to sit still 0  Becoming easily annoyed or irritable 0  Feeling afraid as if something awful might happen 0  GAD-7 Score 3   Interventions:  Conducted a brief chart review Verbal administration of PHQ-9 and GAD-7 for symptom monitoring Provided empathic reflections and validation Reviewed content from the previous session Psychoeducation provided regarding triggers for emotional eating Provided positive reinforcement Focused on rapport building Employed supportive psychotherapy interventions to facilitate reduced distress, and to improve coping skills with identified stressors  DSM-5 Diagnosis: 311 (F32.8) Other Specified Depressive Disorder, Emotional Eating Behaviors  Treatment Goal & Progress: During the initial appointment with this provider, the following treatment goal was established: increase coping skills. Progress is limited, as Vanessa Wilcox has just begun treatment with this provider; however, she is receptive to the interaction and interventions and rapport is being established.   Plan: Vanessa Wilcox continues to appear able and willing to participate as evidenced by engagement in reciprocal  conversation, and asking questions for clarification as appropriate. The next appointment will be scheduled in three weeks, which will be via [employer]. The next session will focus on the introduction of mindfulness.

## 2019-03-07 ENCOUNTER — Other Ambulatory Visit: Payer: Self-pay

## 2019-03-07 ENCOUNTER — Ambulatory Visit (INDEPENDENT_AMBULATORY_CARE_PROVIDER_SITE_OTHER): Payer: BC Managed Care – PPO | Admitting: Psychology

## 2019-03-07 DIAGNOSIS — F3289 Other specified depressive episodes: Secondary | ICD-10-CM

## 2019-03-09 ENCOUNTER — Ambulatory Visit (INDEPENDENT_AMBULATORY_CARE_PROVIDER_SITE_OTHER): Payer: BC Managed Care – PPO | Admitting: Bariatrics

## 2019-03-09 ENCOUNTER — Other Ambulatory Visit: Payer: Self-pay

## 2019-03-09 VITALS — BP 115/73 | HR 122 | Temp 98.5°F | Ht 63.0 in | Wt 307.0 lb

## 2019-03-09 DIAGNOSIS — Z6841 Body Mass Index (BMI) 40.0 and over, adult: Secondary | ICD-10-CM

## 2019-03-09 DIAGNOSIS — E559 Vitamin D deficiency, unspecified: Secondary | ICD-10-CM

## 2019-03-09 DIAGNOSIS — Z9189 Other specified personal risk factors, not elsewhere classified: Secondary | ICD-10-CM

## 2019-03-09 DIAGNOSIS — R7303 Prediabetes: Secondary | ICD-10-CM

## 2019-03-14 NOTE — Progress Notes (Signed)
Office: [phone]  /  Fax: [phone]   HPI:   Chief Complaint: OBESITY Vanessa Wilcox is here to discuss her progress with her obesity treatment plan. She is on the Category 2 plan and is following her eating plan approximately 25% of the time. She states she is exercising 0 minutes 0 times per week. Vanessa Wilcox has been under a lot of stress at work and has not followed the plan. She is getting her water in and is sleeping okay. She states she has been eating more carbohydrates.  Her weight is (!) 307 lb (139.3 kg) today and has had a weight gain of 5 lbs since her last visit. She has lost 0 lbs since starting treatment with [name].  Vitamin D deficiency Vanessa Wilcox has a diagnosis of Vitamin D deficiency. Last Vitamin D 7.6 on 02/09/2019. She is currently taking prescription Vit D and denies nausea, vomiting or muscle weakness.  At risk for osteopenia and osteoporosis Vanessa Wilcox is at higher risk of osteopenia and osteoporosis due to Vitamin D deficiency.   Pre-Diabetes Vanessa Wilcox has a diagnosis of prediabetes based on her elevated Hgb A1c and was informed this puts her at greater risk of developing diabetes. Last A1c 5.7 on 02/09/2019 with an insulin of 21.5. She is not taking metformin currently and continues to work on diet and exercise to decrease risk of diabetes. She denies nausea or hypoglycemia.  ASSESSMENT AND PLAN:  Vitamin D deficiency - Plan: Vitamin D, Ergocalciferol, (DRISDOL) 1.25 MG (50000 UT) CAPS capsule  Prediabetes  At risk for osteoporosis  Class 3 severe obesity with serious comorbidity and body mass index (BMI) of 50.0 to 59.9 in adult, unspecified obesity type ([place])  PLAN:  Vitamin D Deficiency Vanessa Wilcox was informed that low Vitamin D levels contributes to fatigue and are associated with obesity, breast, and colon cancer. She agrees to continue to take prescription Vit D @ 50,000 IU every week #4 with 0 refills and will follow-up for routine testing of Vitamin D, at least 2-3  times per year. She was informed of the risk of over-replacement of Vitamin D and agrees to not increase her dose unless she discusses this with [name] first. Vanessa Wilcox agrees to follow-up with our clinic in 2 weeks.  At risk for osteopenia and osteoporosis Vanessa Wilcox was given extended  (15 minutes) osteoporosis prevention counseling today. Vanessa Wilcox is at risk for osteopenia and osteoporosis due to her Vitamin D deficiency. She was encouraged to take her Vitamin D and follow her higher calcium diet and increase strengthening exercise to help strengthen her bones and decrease her risk of osteopenia and osteoporosis.  Pre-Diabetes Vanessa Wilcox will continue to work on weight loss, exercise, and decreasing simple carbohydrates in her diet to help decrease the risk of diabetes. We dicussed metformin including benefits and risks. She was informed that eating too many simple carbohydrates or too many calories at one sitting increases the likelihood of GI side effects. Vanessa Wilcox will decrease carbohydrates, increase protein, and increase activities. She will follow-up with [name] as directed to monitor her progress.  Obesity Vanessa Wilcox is currently in the action stage of change. As such, her goal is to continue with weight loss efforts. She has agreed to follow the Category 2 plan + 100 calories. Vanessa Wilcox will work on meal planning and intentional eating. She will not skip meals (get boiled eggs). She will increase her water intake to 5 glasses daily. Vanessa Wilcox has been instructed to exercise via an exercise video for 15-20 minutes for weight loss and overall  health benefits. We discussed the following Behavioral Modification Strategies today: increasing lean protein intake, decreasing simple carbohydrates, increasing vegetables, increase H20 intake, decrease eating out, no skipping meals, work on meal planning and easy cooking plans, and keeping healthy foods in the home.  Vanessa Wilcox has agreed to follow-up with our clinic in 2 weeks. She was  informed of the importance of frequent follow-up visits to maximize her success with intensive lifestyle modifications for her multiple health conditions.  ALLERGIES: No Known Allergies  MEDICATIONS: Current Outpatient Medications on File Prior to Visit  Medication Sig Dispense Refill  . hydrochlorothiazide (HYDRODIURIL) 25 MG tablet Take 25 mg by mouth daily.    Marland Kitchen levonorgestrel (MIRENA) 20 MCG/24HR IUD 1 each by Intrauterine route once.    . Vitamin D, Ergocalciferol, (DRISDOL) 1.25 MG (50000 UT) CAPS capsule Take 1 capsule (50,000 Units total) by mouth every 7 (seven) days. 4 capsule 0   No current facility-administered medications on file prior to visit.     PAST MEDICAL HISTORY: Past Medical History:  Diagnosis Date  . Anxiety   . Back pain   . Depression   . Gestational hypertension   . High blood pressure     PAST SURGICAL HISTORY: Past Surgical History:  Procedure Laterality Date  . ASPIRATION BIOPSY     fluid around heart- negative results in 2000    SOCIAL HISTORY: Social History   Tobacco Use  . Smoking status: Never Smoker  . Smokeless tobacco: Never Used  Substance Use Topics  . Alcohol use: No  . Drug use: No    FAMILY HISTORY: Family History  Problem Relation Age of Onset  . Diabetes Mother   . High blood pressure Mother   . Cancer Mother   . Depression Mother   . Sleep apnea Mother   . Obesity Mother   . High Cholesterol Father   . Cancer Father   . Alcoholism Father    ROS: Review of Systems  Gastrointestinal: Negative for nausea and vomiting.  Musculoskeletal:       Negative for muscle weakness.  Endo/Heme/Allergies:       Negative for hypoglycemia.   PHYSICAL EXAM: Blood pressure 115/73, pulse (!) 122, temperature 98.5 F (36.9 C), temperature source Oral, height 5\' 3"  (1.6 m), weight (!) 307 lb (139.3 kg), SpO2 98 %. Body mass index is 54.38 kg/m. Physical Exam Vitals signs reviewed.  Constitutional:      Appearance: Normal  appearance. She is obese.  Cardiovascular:     Rate and Rhythm: Normal rate.     Pulses: Normal pulses.  Pulmonary:     Effort: Pulmonary effort is normal.     Breath sounds: Normal breath sounds.  Musculoskeletal: Normal range of motion.  Skin:    General: Skin is warm and dry.  Neurological:     Mental Status: She is alert and oriented to person, place, and time.  Psychiatric:        Behavior: Behavior normal.   RECENT LABS AND TESTS: BMET    Component Value Date/Time   NA 136 10/03/2016 1912   K 3.9 10/03/2016 1912   CL 104 10/03/2016 1912   CO2 22 10/03/2016 1912   GLUCOSE 111 (H) 10/03/2016 1912   BUN 6 10/03/2016 1912   CREATININE 0.66 10/03/2016 1912   CALCIUM 8.5 (L) 10/03/2016 1912   GFRNONAA >60 10/03/2016 1912   GFRAA >60 10/03/2016 1912   Lab Results  Component Value Date   HGBA1C 5.7 (H) 02/09/2019  Lab Results  Component Value Date   INSULIN 21.5 02/09/2019   CBC    Component Value Date/Time   WBC 13.3 (H) 10/03/2016 1912   RBC 4.54 10/03/2016 1912   HGB 11.9 (L) 10/03/2016 1912   HCT 35.5 (L) 10/03/2016 1912   PLT 272 10/03/2016 1912   MCV 78.2 10/03/2016 1912   MCH 26.2 10/03/2016 1912   MCHC 33.5 10/03/2016 1912   RDW 14.2 10/03/2016 1912   LYMPHSABS 2.0 08/11/2013 2247   MONOABS 0.5 08/11/2013 2247   EOSABS 0.1 08/11/2013 2247   BASOSABS 0.0 08/11/2013 2247   Iron/TIBC/Ferritin/ %Sat No results found for: IRON, TIBC, FERRITIN, IRONPCTSAT Lipid Panel  No results found for: CHOL, TRIG, HDL, CHOLHDL, VLDL, LDLCALC, LDLDIRECT Hepatic Function Panel     Component Value Date/Time   PROT 7.0 10/03/2016 1912   ALBUMIN 3.1 (L) 10/03/2016 1912   AST 17 10/03/2016 1912   ALT 13 (L) 10/03/2016 1912   ALKPHOS 56 10/03/2016 1912   BILITOT 0.6 10/03/2016 1912   No results found for: TSH  Results for TAMIRA, CULHANE (MRN [id number]) as of 03/14/2019 09:03  Ref. Range 02/09/2019 09:20  Vitamin D, 25-Hydroxy Latest Ref Range: 30.0 - 100.0 ng/mL 7.6  (L)   OBESITY BEHAVIORAL INTERVENTION VISIT  Today's visit was #3  Starting weight: 306 lbs Starting date: 02/09/2019 Today's weight: 307 lbs  Today's date: 03/09/2019 Total lbs lost to date: 0    03/09/2019  Height 5\' 3"  (1.6 m)  Weight 307 lb (139.3 kg) (A)  BMI (Calculated) 54.4  BLOOD PRESSURE - SYSTOLIC [id number]  BLOOD PRESSURE - DIASTOLIC 73  Total Body Water (lbs) 56.8 lbs   ASK: We discussed the diagnosis of obesity with Johny Shock today and Sukhdeep agreed to give [name] permission to discuss obesity behavioral modification therapy today.  ASSESS: Daisymae has the diagnosis of obesity and her BMI today is 54.4. Aicha is in the action stage of change.   ADVISE: Chenequa was educated on the multiple health risks of obesity as well as the benefit of weight loss to improve her health. She was advised of the need for long term treatment and the importance of lifestyle modifications to improve her current health and to decrease her risk of future health problems.  AGREE: Multiple dietary modification options and treatment options were discussed and  Elexus agreed to follow the recommendations documented in the above note.  ARRANGE: Justise was educated on the importance of frequent visits to treat obesity as outlined per CMS and USPSTF guidelines and agreed to schedule her next follow up appointment today.  Migdalia Dk, am acting as Location manager for [employer], DO  I have reviewed the above documentation for accuracy and completeness, and I agree with the above. -Jearld Lesch, DO

## 2019-03-15 NOTE — Progress Notes (Signed)
Office: [phone]  /  Fax: [phone]    Date: March 27, 2019   Appointment Start Time: 3:38pm Duration: 31 minutes Provider: Glennie Isle, Psy.D. Type of Session: Individual Therapy  Location of Patient: Home Location of Provider: Provider's Home Type of Contact: Telepsychological Visit via [employer] Web[employer]   Session Content: Vanessa Wilcox is a 38 y.o. female presenting via [place] for a follow-up appointment to address the previously established treatment goal of increasing coping skills. Of note, this provider called Vanessa Wilcox at 3:37pm as she did not present for the [facility] appointment. She indicated she was waiting for this provider to join as the appointment time was moved up and Webex would not allow her to join. She was observed joining shortly after. Today's appointment was a telepsychological visit, as this provider's clinic is seeing a limited number of patients for in-person visits due to COVID-19. Therapeutic services will resume to in-person appointments once deemed appropriate. Masey expressed understanding regarding the rationale for telepsychological services, and provided verbal consent for today's appointment. Prior to proceeding with today's appointment, Vanessa Wilcox's physical location at the time of this appointment was obtained. Sierah reported she was at home and provided the address. In the event of technical difficulties, Vanessa Wilcox shared a phone number she could be reached at. [name] and this provider participated in today's telepsychological service. Also, [name] denied anyone else being present in the room or on the WebEx appointment.  This provider conducted a brief check-in and verbally administered the PHQ-9 and GAD-7. A risk assessment was completed. [name] denied experiencing suicidal and homicidal ideation, plan, and intent since the last appointment with this provider. She continues to acknowledge understanding regarding the importance of reaching out to trusted individuals  and/or emergency resources if she is unable to ensure safety.   [name] shared she started walking again and focusing on hydration by setting alarms on her phone. She also noted a reduction in eating out. Positive reinforcement was provided. [name] added, "I'm still having issues with letting certain foods go." She also identified stress as a trigger for emotional eating. This provider explored Vanessa Wilcox's recent eating habits further and she discussed going long periods without eating resulting in her not eating enough. As such, this provider engaged Vanessa Wilcox in problem solving to increase food intake congruent to her meal plan. Vanessa Wilcox was receptive to setting alarms to eat, and preparing breakfast and lunch ahead of time. Moreover, it was recommended she pack snacks and her meals congruent to her meal plan as if she were physically going to work to begin establishing a routine as she reportedly will have to return to the office soon. Vanessa Wilcox was receptive as evidenced by her stating, "I can do that." Overall, Vanessa Wilcox was receptive to today's session as evidenced by openness to sharing, responsiveness to feedback, and willingness to implement discussed strategies.   Mental Status Examination:  Appearance: neat Behavior: cooperative Mood: euthymic Affect: mood congruent Speech: normal in rate, volume, and tone Eye Contact: appropriate Psychomotor Activity: appropriate Thought Process: linear, logical, and goal directed  Content/Perceptual Disturbances: denies suicidal and homicidal ideation, plan, and intent and no hallucinations, delusions, bizarre thinking or behavior reported or observed Orientation: time, person, place and purpose of appointment Cognition/Sensorium: memory, attention, language, and fund of knowledge intact  Insight: good Judgment: good  Structured Assessment Results: The Patient Health Questionnaire-9 (PHQ-9) is a self-report measure that assesses symptoms and severity of depression  over the course of the last two weeks. Vanessa Wilcox obtained a score of 2 suggesting minimal  depression. Vanessa Wilcox finds the endorsed symptoms to be not difficult at all. Little interest or pleasure in doing things 0  Feeling down, depressed, or hopeless 0  Trouble falling or staying asleep, or sleeping too much 0  Feeling tired or having little energy 0  Poor appetite or overeating 1  Feeling bad about yourself --- or that you are a failure or have let yourself or your family down 1  Trouble concentrating on things, such as reading the newspaper or watching television 0  Moving or speaking so slowly that other people could have noticed? Or the opposite --- being so fidgety or restless that you have been moving around a lot more than usual 0  Thoughts that you would be better off dead or hurting yourself in some way 0  PHQ-9 Score 2    The Generalized Anxiety Disorder-7 (GAD-7) is a brief self-report measure that assesses symptoms of anxiety over the course of the last two weeks. Vanessa Wilcox obtained a score of 1 suggesting minimal anxiety. Vanessa Wilcox finds the endorsed symptoms to be somewhat difficult. Feeling nervous, anxious, on edge 0  Not being able to stop or control worrying 0  Worrying too much about different things 1  Trouble relaxing 0  Being so restless that it's hard to sit still 0  Becoming easily annoyed or irritable 0  Feeling afraid as if something awful might happen 0  GAD-7 Score 1   Interventions:  Conducted a brief chart review Verbal administration of PHQ-9 and GAD-7 for symptom monitoring Provided empathic reflections and validation Reviewed content from the previous session Engaged patient in problem solving Provided positive reinforcement Employed supportive psychotherapy interventions to facilitate reduced distress, and to improve coping skills with identified stressors  DSM-5 Diagnosis: 311 (F32.8) Other Specified Depressive Disorder, Emotional Eating Behaviors  Treatment  Goal & Progress: During the initial appointment with this provider, the following treatment goal was established: increase coping skills. Vanessa Wilcox has demonstrated progress in her goal as evidenced by increased awareness of hunger patterns and triggers for emotional eating. Tarria also reported willingness to implement discussed strategies.   Plan: Mikiya continues to appear able and willing to participate as evidenced by engagement in reciprocal conversation, and asking questions for clarification as appropriate. Based on appointment availability and Ruwaida needing a late afternoon appointment time due to work, the next appointment will be scheduled in three weeks, which will be via [employer]. The next session will focus on the introduction of mindfulness was it was not introduced today based on Evyn's presenting concerns.

## 2019-03-20 DIAGNOSIS — R7303 Prediabetes: Secondary | ICD-10-CM | POA: Insufficient documentation

## 2019-03-20 MED ORDER — VITAMIN D (ERGOCALCIFEROL) 1.25 MG (50000 UNIT) PO CAPS: 50000.0000 [IU] | ORAL_CAPSULE | ORAL | 0 refills | Status: DC

## 2019-03-23 ENCOUNTER — Encounter (INDEPENDENT_AMBULATORY_CARE_PROVIDER_SITE_OTHER): Payer: Self-pay

## 2019-03-27 ENCOUNTER — Ambulatory Visit (INDEPENDENT_AMBULATORY_CARE_PROVIDER_SITE_OTHER): Payer: BC Managed Care – PPO | Admitting: Psychology

## 2019-03-27 ENCOUNTER — Other Ambulatory Visit: Payer: Self-pay

## 2019-03-27 DIAGNOSIS — F3289 Other specified depressive episodes: Secondary | ICD-10-CM

## 2019-03-30 ENCOUNTER — Ambulatory Visit (INDEPENDENT_AMBULATORY_CARE_PROVIDER_SITE_OTHER): Payer: BC Managed Care – PPO | Admitting: Bariatrics

## 2019-03-30 ENCOUNTER — Encounter (INDEPENDENT_AMBULATORY_CARE_PROVIDER_SITE_OTHER): Payer: Self-pay | Admitting: Bariatrics

## 2019-03-30 ENCOUNTER — Other Ambulatory Visit: Payer: Self-pay

## 2019-03-30 DIAGNOSIS — I10 Essential (primary) hypertension: Secondary | ICD-10-CM

## 2019-03-30 DIAGNOSIS — Z6841 Body Mass Index (BMI) 40.0 and over, adult: Secondary | ICD-10-CM

## 2019-03-30 DIAGNOSIS — E559 Vitamin D deficiency, unspecified: Secondary | ICD-10-CM

## 2019-03-30 MED ORDER — VITAMIN D (ERGOCALCIFEROL) 1.25 MG (50000 UNIT) PO CAPS: 50000.0000 [IU] | ORAL_CAPSULE | ORAL | 0 refills | Status: DC

## 2019-04-03 ENCOUNTER — Encounter (INDEPENDENT_AMBULATORY_CARE_PROVIDER_SITE_OTHER): Payer: Self-pay | Admitting: Bariatrics

## 2019-04-03 NOTE — Progress Notes (Signed)
Office: [phone]  /  Fax: [phone] TeleHealth Visit:  Vanessa Wilcox has verbally consented to this TeleHealth visit today. The patient is located at home, the provider is located at the [employer] [facility] office. The participants in this visit include the listed provider and patient. The visit was conducted today via Webex.  HPI:   Chief Complaint: OBESITY Vanessa Wilcox is here to discuss her progress with her obesity treatment plan. She is on the Category 2 plan and is following her eating plan approximately 40% of the time. She states she is walking 30 minutes 3-5 times per week. Destin states that her weight remains the same. She has not been doing well with the plan.  We were unable to weigh the patient today for this TeleHealth visit. She feels as if she has maintained her weight since her last visit. She has lost 0 lbs since starting treatment with [name].  Vitamin D deficiency Vanessa Wilcox has a diagnosis of Vitamin D deficiency. She is currently taking prescription Vit D and denies nausea, vomiting or muscle weakness.  Hypertension Vanessa Wilcox is a 38 y.o. female with hypertension.  Vanessa Wilcox denies chest pain or shortness of breath on exertion. She is working weight loss to help control her blood pressure with the goal of decreasing her risk of heart attack and stroke. Kendel's blood pressure is well controlled.  Depression, Other Vanessa Wilcox is struggling with emotional eating and using food for comfort to the extent that it is negatively impacting her health. She often snacks when she is not hungry. Eliette sometimes feels she is out of control and then feels guilty that she made poor food choices. She has been working on behavior modification techniques to help reduce her emotional eating and has been somewhat successful. Zaviera has seen Dr. Mallie Mussel. She shows no sign of suicidal or homicidal ideations.  Depression screen [facility] 2/9 02/09/2019 10/05/2016  Decreased Interest 3 2  Down,  Depressed, Hopeless 3 2  PHQ - 2 Score 6 4  Altered sleeping 3 3  Tired, decreased energy 3 2  Change in appetite 0 3  Feeling bad or failure about yourself  2 2  Trouble concentrating 2 0  Moving slowly or fidgety/restless 2 0  Suicidal thoughts 1 0  PHQ-9 Score 19 14  Difficult doing work/chores Very difficult -   ASSESSMENT AND PLAN:  Vitamin D deficiency - Plan: Vitamin D, Ergocalciferol, (DRISDOL) 1.25 MG (50000 UT) CAPS capsule  Essential hypertension  Class 3 severe obesity due to excess calories with serious comorbidity and body mass index (BMI) of 50.0 to 59.9 in adult [facility])  PLAN:  Vitamin D Deficiency Vanessa Wilcox was informed that low Vitamin D levels contributes to fatigue and are associated with obesity, breast, and colon cancer. She agrees to continue to take prescription Vit D @ 50,000 IU every week #4 with 0 refills and will follow-up for routine testing of Vitamin D, at least 2-3 times per year. She was informed of the risk of over-replacement of Vitamin D and agrees to not increase her dose unless she discusses this with [name] first. Vanessa Wilcox agrees to follow-up with our clinic in 2 weeks.  Hypertension We discussed sodium restriction, working on healthy weight loss, and a regular exercise program as the means to achieve improved blood pressure control. Vanessa Wilcox agreed with this plan and agreed to follow up as directed. We will continue to monitor her blood pressure as well as her progress with the above lifestyle modifications. She will continue  her medications as prescribed and will watch for signs of hypotension as she continues her lifestyle modifications.  Depression, Other We discussed behavior modification techniques today to help Vanessa Wilcox deal with her emotional eating and depression. She will walk 1 mile, which she states helps with her stress. She will follow-up with Dr. Mallie Mussel as directed.  Obesity Vanessa Wilcox is currently in the action stage of change. As such, her goal is  to continue with weight loss efforts. She has agreed to follow the Category 2 plan. Vanessa Wilcox will work on meal planning, intentional eating, and increasing her water intake to 64 ounces. She states she has been writing down everything that she eats. Vanessa Wilcox has been instructed to increase exercise (video) for weight loss and overall health benefits. We discussed the following Behavioral Modification Strategies today: increasing lean protein intake, decreasing simple carbohydrates, increasing vegetables, increase H20 intake, decrease eating out, no skipping meals, work on meal planning and easy cooking plans, keeping healthy foods in the home, and planning for success.  Vanessa Wilcox has agreed to follow-up with our clinic in 2 weeks. She was informed of the importance of frequent follow-up visits to maximize her success with intensive lifestyle modifications for her multiple health conditions.  ALLERGIES: No Known Allergies  MEDICATIONS: Current Outpatient Medications on File Prior to Visit  Medication Sig Dispense Refill  . hydrochlorothiazide (HYDRODIURIL) 25 MG tablet Take 25 mg by mouth daily.    Marland Kitchen levonorgestrel (MIRENA) 20 MCG/24HR IUD 1 each by Intrauterine route once.     No current facility-administered medications on file prior to visit.     PAST MEDICAL HISTORY: Past Medical History:  Diagnosis Date  . Anxiety   . Back pain   . Depression   . Gestational hypertension   . High blood pressure     PAST SURGICAL HISTORY: Past Surgical History:  Procedure Laterality Date  . ASPIRATION BIOPSY     fluid around heart- negative results in 2000    SOCIAL HISTORY: Social History   Tobacco Use  . Smoking status: Never Smoker  . Smokeless tobacco: Never Used  Substance Use Topics  . Alcohol use: No  . Drug use: No    FAMILY HISTORY: Family History  Problem Relation Age of Onset  . Diabetes Mother   . High blood pressure Mother   . Cancer Mother   . Depression Mother   .  Sleep apnea Mother   . Obesity Mother   . High Cholesterol Father   . Cancer Father   . Alcoholism Father    ROS: Review of Systems  Respiratory: Negative for shortness of breath.   Cardiovascular: Negative for chest pain.  Gastrointestinal: Negative for nausea and vomiting.  Musculoskeletal:       Negative for muscle weakness.  Psychiatric/Behavioral: Positive for depression. Negative for suicidal ideas.       Negative for homicidal ideas.   PHYSICAL EXAM: Pt in no acute distress  RECENT LABS AND TESTS: BMET    Component Value Date/Time   NA 136 10/03/2016 1912   K 3.9 10/03/2016 1912   CL 104 10/03/2016 1912   CO2 22 10/03/2016 1912   GLUCOSE 111 (H) 10/03/2016 1912   BUN 6 10/03/2016 1912   CREATININE 0.66 10/03/2016 1912   CALCIUM 8.5 (L) 10/03/2016 1912   GFRNONAA >60 10/03/2016 1912   GFRAA >60 10/03/2016 1912   Lab Results  Component Value Date   HGBA1C 5.7 (H) 02/09/2019   Lab Results  Component Value Date  INSULIN 21.5 02/09/2019   CBC    Component Value Date/Time   WBC 13.3 (H) 10/03/2016 1912   RBC 4.54 10/03/2016 1912   HGB 11.9 (L) 10/03/2016 1912   HCT 35.5 (L) 10/03/2016 1912   PLT 272 10/03/2016 1912   MCV 78.2 10/03/2016 1912   MCH 26.2 10/03/2016 1912   MCHC 33.5 10/03/2016 1912   RDW 14.2 10/03/2016 1912   LYMPHSABS 2.0 08/11/2013 2247   MONOABS 0.5 08/11/2013 2247   EOSABS 0.1 08/11/2013 2247   BASOSABS 0.0 08/11/2013 2247   Iron/TIBC/Ferritin/ %Sat No results found for: IRON, TIBC, FERRITIN, IRONPCTSAT Lipid Panel  No results found for: CHOL, TRIG, HDL, CHOLHDL, VLDL, LDLCALC, LDLDIRECT Hepatic Function Panel     Component Value Date/Time   PROT 7.0 10/03/2016 1912   ALBUMIN 3.1 (L) 10/03/2016 1912   AST 17 10/03/2016 1912   ALT 13 (L) 10/03/2016 1912   ALKPHOS 56 10/03/2016 1912   BILITOT 0.6 10/03/2016 1912   No results found for: TSH  Results for TALLI, KULESZA (MRN [id number]) as of 04/03/2019 13:49  Ref. Range  02/09/2019 09:20  Vitamin D, 25-Hydroxy Latest Ref Range: 30.0 - 100.0 ng/mL 7.6 (L)   I, Michaelene Song, am acting as Location manager for [employer], DO  I have reviewed the above documentation for accuracy and completeness, and I agree with the above. -Jearld Lesch, DO

## 2019-04-09 ENCOUNTER — Other Ambulatory Visit (INDEPENDENT_AMBULATORY_CARE_PROVIDER_SITE_OTHER): Payer: Self-pay | Admitting: Bariatrics

## 2019-04-09 DIAGNOSIS — E559 Vitamin D deficiency, unspecified: Secondary | ICD-10-CM

## 2019-04-12 NOTE — Progress Notes (Signed)
Office: [phone]  /  Fax: [phone]    Date: April 17, 2019   Appointment Start Time: 4:33pm Duration: 25 minutes Provider: Glennie Isle, Psy.D. Type of Session: Individual Therapy  Location of Patient: Parked in car Location of Provider: Provider's Home Type of Contact: Telepsychological Visit via [employer]   Session Content: Of note, this provider called Vanessa Wilcox at 4:32pm as she did not present for the [facility] appointment. She indicated she forgot about today's appointment, but noted she could join. The e-mail with the secure link was re-sent. As such, today's appointment was initiated 3 minutes late.  Vanessa Wilcox is a 38 y.o. female presenting via [place] for a follow-up appointment to address the previously established treatment goal of increasing coping skills. Today's appointment was a telepsychological visit, as it is an option for appointments to reduce exposure to COVID-19. Kaede expressed understanding regarding the rationale for telepsychological services, and provided verbal consent for today's appointment. Prior to proceeding with today's appointment, Ruth's physical location at the time of this appointment was obtained. Seville reported she was parked in her car at work, and provided the address. In the event of technical difficulties, Athaleen shared a phone number she could be reached at. Cartha and this provider participated in today's telepsychological service. Also, Hawraa denied anyone else being present in the car or on the WebEx appointment.  This provider conducted a brief check-in and verbally administered the PHQ-9 and GAD-7. Vanessa Wilcox stated, "The meal plan is going good." She indicated a reduction in emotional eating and reported she is engaging in meal prepping. Positive reinforcement was provided. Psychoeducation regarding mindfulness was provided to assist with coping. A handout was provided to [facility] with further information regarding mindfulness, including  exercises. This provider also explained the benefit of mindfulness as it relates to emotional eating. Vanessa Wilcox was encouraged to engage in the provided exercises between now and the next appointment with this provider. Vanessa Wilcox agreed. She was led through an exercise involving her senses. Vanessa Wilcox provided verbal consent during today's appointment for this provider to send a handout about mindfulness via e-mail. Overall, Vanessa Wilcox was receptive to today's session as evidenced by openness to sharing, responsiveness to feedback, and willingness to engage in mindfulness exercises.  Mental Status Examination:  Appearance: neat Behavior: cooperative Mood: euthymic Affect: mood congruent Speech: normal in rate, volume, and tone Eye Contact: appropriate Psychomotor Activity: appropriate Thought Process: linear, logical, and goal directed  Content/Perceptual Disturbances: denies suicidal and homicidal ideation, plan, and intent and no hallucinations, delusions, bizarre thinking or behavior reported or observed Orientation: time, person, place and purpose of appointment Cognition/Sensorium: memory, attention, language, and fund of knowledge intact  Insight: good Judgment: good  Structured Assessment Results: The Patient Health Questionnaire-9 (PHQ-9) is a self-report measure that assesses symptoms and severity of depression over the course of the last two weeks. Vanessa Wilcox obtained a score of 2 suggesting minimal depression. Vanessa Wilcox finds the endorsed symptoms to be not difficult at all. Little interest or pleasure in doing things 0  Feeling down, depressed, or hopeless 0  Trouble falling or staying asleep, or sleeping too much 0  Feeling tired or having little energy 1  Poor appetite or overeating 1  Feeling bad about yourself --- or that you are a failure or have let yourself or your family down 0  Trouble concentrating on things, such as reading the newspaper or watching television 0  Moving or speaking so  slowly that other people could have noticed? Or the opposite --- being  so fidgety or restless that you have been moving around a lot more than usual 0  Thoughts that you would be better off dead or hurting yourself in some way 0  PHQ-9 Score 2    The Generalized Anxiety Disorder-7 (GAD-7) is a brief self-report measure that assesses symptoms of anxiety over the course of the last two weeks. Vanessa Wilcox obtained a score of 0. Feeling nervous, anxious, on edge 0  Not being able to stop or control worrying 0  Worrying too much about different things 0  Trouble relaxing 0  Being so restless that it's hard to sit still 0  Becoming easily annoyed or irritable 0  Feeling afraid as if something awful might happen 0  GAD-7 Score 0   Interventions:  Conducted a brief chart review Verbal administration of PHQ-9 and GAD-7 for symptom monitoring Provided empathic reflections and validation Reviewed content from the previous session Psychoeducation provided regarding mindfulness Engaged patient in a mindfulness exercise Provided positive reinforcement Employed supportive psychotherapy interventions to facilitate reduced distress, and to improve coping skills with identified stressors Employed acceptance and commitment interventions to emphasize mindfulness and acceptance without struggle  DSM-5 Diagnosis: 311 (F32.8) Other Specified Depressive Disorder, Emotional Eating Behaviors  Treatment Goal & Progress: During the initial appointment with this provider, the following treatment goal was established: increase coping skills. Vanessa Wilcox has demonstrated progress in her goal as evidenced by increased awareness of hunger patterns and triggers for emotional eating. Vanessa Wilcox also demonstrates willingness to engage in mindfulness exercises.  Plan: Vanessa Wilcox continues to appear able and willing to participate as evidenced by engagement in reciprocal conversation, and asking questions for clarification as appropriate. Per  Vanessa Wilcox's request for a later afternoon appointment, the next appointment will be scheduled in three weeks, which will be via [employer]. The next session will focus further on mindfulness.

## 2019-04-13 ENCOUNTER — Other Ambulatory Visit: Payer: Self-pay

## 2019-04-13 ENCOUNTER — Ambulatory Visit (INDEPENDENT_AMBULATORY_CARE_PROVIDER_SITE_OTHER): Payer: BC Managed Care – PPO | Admitting: Bariatrics

## 2019-04-13 ENCOUNTER — Encounter (INDEPENDENT_AMBULATORY_CARE_PROVIDER_SITE_OTHER): Payer: Self-pay | Admitting: Bariatrics

## 2019-04-13 VITALS — BP 111/71 | HR 86 | Temp 98.1°F | Ht 63.0 in | Wt 306.0 lb

## 2019-04-13 DIAGNOSIS — E559 Vitamin D deficiency, unspecified: Secondary | ICD-10-CM

## 2019-04-13 DIAGNOSIS — I10 Essential (primary) hypertension: Secondary | ICD-10-CM | POA: Diagnosis not present

## 2019-04-13 DIAGNOSIS — Z6841 Body Mass Index (BMI) 40.0 and over, adult: Secondary | ICD-10-CM | POA: Diagnosis not present

## 2019-04-16 NOTE — Progress Notes (Signed)
Office: [phone]  /  Fax: [phone]   HPI:   Chief Complaint: OBESITY Vanessa Wilcox is here to discuss her progress with her obesity treatment plan. She is on the Category 2 plan and is following her eating plan approximately 45 % of the time. She states she is walking for 30 minutes 3-4 times per week. Vanessa Wilcox is down 1 lb since her last visit. She states that she is motivated and ready to get more diligent with her diet.  Her weight is (!) 306 lb (138.8 kg) today and has had a weight loss of 1 pound over a period of 2 weeks since her last visit. She has lost 0 lbs since starting treatment with [name].  Hypertension Vanessa Wilcox is a 38 y.o. female with hypertension. Vanessa Wilcox's blood pressure is well controlled. She is not taking hydrochlorothiazide. She is working on weight loss to help control her blood pressure with the goal of decreasing her risk of heart attack and stroke.   Vitamin D Deficiency Vanessa Wilcox has a diagnosis of vitamin D deficiency. She is currently taking prescription Vit D and denies nausea, vomiting or muscle weakness.  ASSESSMENT AND PLAN:  Essential hypertension  Vitamin D deficiency  Class 3 severe obesity due to excess calories with serious comorbidity and body mass index (BMI) of 50.0 to 59.9 in adult [facility])  PLAN:  Hypertension We discussed sodium restriction, working on healthy weight loss, and a regular exercise program as the means to achieve improved blood pressure control. Vanessa Wilcox agreed with this plan and agreed to follow up as directed. We will continue to monitor her blood pressure as well as her progress with the above lifestyle modifications. Vanessa Wilcox will not resume her medications at this time and will watch for signs of hypotension as she continues her lifestyle modifications. Vanessa Wilcox agrees to follow up with our clinic in 2 weeks.  Vitamin D Deficiency Vanessa Wilcox was informed that low vitamin D levels contributes to fatigue and are associated with obesity,  breast, and colon cancer. Vanessa Wilcox agrees to continue taking prescription Vit D 50,000 IU every week and will follow up for routine testing of vitamin D, at least 2-3 times per year. She was informed of the risk of over-replacement of vitamin D and agrees to not increase her dose unless she discusses this with [name] first. Vanessa Wilcox agrees to follow up with our clinic in 2 weeks.  I spent > than 50% of the 15 minute visit on counseling as documented in the note.  Obesity Vanessa Wilcox is currently in the action stage of change. As such, her goal is to continue with weight loss efforts She has agreed to follow the Category 2 plan Vanessa Wilcox has been instructed to work up to a goal of 150 minutes of combined cardio and strengthening exercise per week or continue walking 5-6 days per week for weight loss and overall health benefits. We discussed the following Behavioral Modification Strategies today: increasing lean protein intake, decreasing simple carbohydrates, increasing vegetables, decrease eating out, increase H20 intake, no skipping meals, work on meal planning and easy cooking plans, keeping healthy foods in the home, and planning for success Vanessa Wilcox is to increase her H20 intake to 64 oz daily, she will eat at home, and will pack her lunch.  Vanessa Wilcox has agreed to follow up with our clinic in 2 weeks. She was informed of the importance of frequent follow up visits to maximize her success with intensive lifestyle modifications for her multiple health conditions.  ALLERGIES: No Known Allergies  MEDICATIONS: Current Outpatient Medications on File Prior to Visit  Medication Sig Dispense Refill  . Vitamin D, Ergocalciferol, (DRISDOL) 1.25 MG (50000 UT) CAPS capsule Take 1 capsule (50,000 Units total) by mouth every 7 (seven) days. 4 capsule 0  . CRYSELLE-28 0.3-30 MG-MCG tablet Take 1 tablet by mouth daily.     No current facility-administered medications on file prior to visit.     PAST MEDICAL HISTORY: Past  Medical History:  Diagnosis Date  . Anxiety   . Back pain   . Depression   . Gestational hypertension   . High blood pressure     PAST SURGICAL HISTORY: Past Surgical History:  Procedure Laterality Date  . ASPIRATION BIOPSY     fluid around heart- negative results in 2000    SOCIAL HISTORY: Social History   Tobacco Use  . Smoking status: Never Smoker  . Smokeless tobacco: Never Used  Substance Use Topics  . Alcohol use: No  . Drug use: No    FAMILY HISTORY: Family History  Problem Relation Age of Onset  . Diabetes Mother   . High blood pressure Mother   . Cancer Mother   . Depression Mother   . Sleep apnea Mother   . Obesity Mother   . High Cholesterol Father   . Cancer Father   . Alcoholism Father     ROS: Review of Systems  Constitutional: Positive for weight loss.  Cardiovascular: Negative for chest pain.  Gastrointestinal: Negative for nausea and vomiting.  Musculoskeletal:       Negative muscle weakness    PHYSICAL EXAM: Blood pressure 111/71, pulse 86, temperature 98.1 F (36.7 C), temperature source Oral, height 5\' 3"  (1.6 m), weight (!) 306 lb (138.8 kg), last menstrual period 03/31/2019, SpO2 96 %. Body mass index is 54.21 kg/m. Physical Exam Vitals signs reviewed.  Constitutional:      Appearance: Normal appearance. She is obese.  Cardiovascular:     Rate and Rhythm: Normal rate.     Pulses: Normal pulses.  Pulmonary:     Effort: Pulmonary effort is normal.     Breath sounds: Normal breath sounds.  Musculoskeletal: Normal range of motion.  Skin:    General: Skin is warm and dry.  Neurological:     Mental Status: She is alert and oriented to person, place, and time.  Psychiatric:        Mood and Affect: Mood normal.        Behavior: Behavior normal.     RECENT LABS AND TESTS: BMET    Component Value Date/Time   NA 136 10/03/2016 1912   K 3.9 10/03/2016 1912   CL 104 10/03/2016 1912   CO2 22 10/03/2016 1912   GLUCOSE 111 (H)  10/03/2016 1912   BUN 6 10/03/2016 1912   CREATININE 0.66 10/03/2016 1912   CALCIUM 8.5 (L) 10/03/2016 1912   GFRNONAA >60 10/03/2016 1912   GFRAA >60 10/03/2016 1912   Lab Results  Component Value Date   HGBA1C 5.7 (H) 02/09/2019   Lab Results  Component Value Date   INSULIN 21.5 02/09/2019   CBC    Component Value Date/Time   WBC 13.3 (H) 10/03/2016 1912   RBC 4.54 10/03/2016 1912   HGB 11.9 (L) 10/03/2016 1912   HCT 35.5 (L) 10/03/2016 1912   PLT 272 10/03/2016 1912   MCV 78.2 10/03/2016 1912   MCH 26.2 10/03/2016 1912   MCHC 33.5 10/03/2016 1912   RDW 14.2 10/03/2016 1912   LYMPHSABS 2.0  08/11/2013 2247   MONOABS 0.5 08/11/2013 2247   EOSABS 0.1 08/11/2013 2247   BASOSABS 0.0 08/11/2013 2247   Iron/TIBC/Ferritin/ %Sat No results found for: IRON, TIBC, FERRITIN, IRONPCTSAT Lipid Panel  No results found for: CHOL, TRIG, HDL, CHOLHDL, VLDL, LDLCALC, LDLDIRECT Hepatic Function Panel     Component Value Date/Time   PROT 7.0 10/03/2016 1912   ALBUMIN 3.1 (L) 10/03/2016 1912   AST 17 10/03/2016 1912   ALT 13 (L) 10/03/2016 1912   ALKPHOS 56 10/03/2016 1912   BILITOT 0.6 10/03/2016 1912   No results found for: TSH    OBESITY BEHAVIORAL INTERVENTION VISIT  Today's visit was # 5   Starting weight: 306 lbs Starting date: 02/09/2019 Today's weight : 306 lbs Today's date: 04/13/2019 Total lbs lost to date: 0    ASK: We discussed the diagnosis of obesity with Vanessa Wilcox today and Vanessa Wilcox agreed to give [name] permission to discuss obesity behavioral modification therapy today.  ASSESS: Vanessa Wilcox has the diagnosis of obesity and her BMI today is 54.22 Vanessa Wilcox is in the action stage of change   ADVISE: Vanessa Wilcox was educated on the multiple health risks of obesity as well as the benefit of weight loss to improve her health. She was advised of the need for long term treatment and the importance of lifestyle modifications to improve her current health and to decrease her  risk of future health problems.  AGREE: Multiple dietary modification options and treatment options were discussed and  Vanessa Wilcox agreed to follow the recommendations documented in the above note.  ARRANGE: Vanessa Wilcox was educated on the importance of frequent visits to treat obesity as outlined per CMS and USPSTF guidelines and agreed to schedule her next follow up appointment today.  Vanessa Wilcox, am acting as transcriptionist for [employer], DO  I have reviewed the above documentation for accuracy and completeness, and I agree with the above. -Jearld Lesch, DO

## 2019-04-17 ENCOUNTER — Other Ambulatory Visit: Payer: Self-pay

## 2019-04-17 ENCOUNTER — Ambulatory Visit (INDEPENDENT_AMBULATORY_CARE_PROVIDER_SITE_OTHER): Payer: BC Managed Care – PPO | Admitting: Psychology

## 2019-04-17 DIAGNOSIS — F3289 Other specified depressive episodes: Secondary | ICD-10-CM | POA: Diagnosis not present

## 2019-04-19 ENCOUNTER — Encounter (INDEPENDENT_AMBULATORY_CARE_PROVIDER_SITE_OTHER): Payer: Self-pay | Admitting: Bariatrics

## 2019-05-03 ENCOUNTER — Other Ambulatory Visit: Payer: Self-pay

## 2019-05-03 ENCOUNTER — Encounter (INDEPENDENT_AMBULATORY_CARE_PROVIDER_SITE_OTHER): Payer: Self-pay | Admitting: Bariatrics

## 2019-05-03 ENCOUNTER — Ambulatory Visit (INDEPENDENT_AMBULATORY_CARE_PROVIDER_SITE_OTHER): Payer: BC Managed Care – PPO | Admitting: Bariatrics

## 2019-05-03 VITALS — BP 136/91 | HR 85 | Temp 99.0°F | Ht 63.0 in | Wt 305.0 lb

## 2019-05-03 DIAGNOSIS — Z9189 Other specified personal risk factors, not elsewhere classified: Secondary | ICD-10-CM | POA: Diagnosis not present

## 2019-05-03 DIAGNOSIS — Z6841 Body Mass Index (BMI) 40.0 and over, adult: Secondary | ICD-10-CM

## 2019-05-03 DIAGNOSIS — F3289 Other specified depressive episodes: Secondary | ICD-10-CM | POA: Diagnosis not present

## 2019-05-03 DIAGNOSIS — E559 Vitamin D deficiency, unspecified: Secondary | ICD-10-CM | POA: Diagnosis not present

## 2019-05-03 MED ORDER — VITAMIN D (ERGOCALCIFEROL) 1.25 MG (50000 UNIT) PO CAPS: 50000.0000 [IU] | ORAL_CAPSULE | ORAL | 0 refills | Status: DC

## 2019-05-04 ENCOUNTER — Encounter (INDEPENDENT_AMBULATORY_CARE_PROVIDER_SITE_OTHER): Payer: Self-pay | Admitting: Bariatrics

## 2019-05-04 NOTE — Progress Notes (Unsigned)
Office: [phone]  /  Fax: [phone]    Date: May 11, 2019   Appointment Start Time: *** Duration: *** minutes Provider: Glennie Isle, Psy.D. Type of Session: Individual Therapy  Location of Patient: {gbptloc:23249} Location of Provider: [facility] Type of Contact: Telepsychological Visit via [employer] Web[employer]   Session Content:Of note, this provider called Seth Bake at 11:32am as she did not present for the [facility] appointment. *** The e-mail with the secure link was re-sent. As such, today's appointment was initiated *** minutes late.  Vanessa Wilcox is a 38 y.o. female presenting via [place] for a follow-up appointment to address the previously established treatment goal of increasing coping skills. Today's appointment was a telepsychological visit, as it is an option for appointments to reduce exposure to COVID-19. Ludwika expressed understanding regarding the rationale for telepsychological services, and provided verbal consent for today's appointment. Prior to proceeding with today's appointment, Wilna's physical location at the time of this appointment was obtained. In the event of technical difficulties, Latricia shared a phone number she could be reached at. Hjordis and this provider participated in today's telepsychological service. Also, Glenrose denied anyone else being present in the room or on the WebEx appointment ***.  This provider conducted a brief check-in and verbally administered the PHQ-9 and GAD-7. *** Tawatha was receptive to today's session as evidenced by openness to sharing, responsiveness to feedback, and ***.  Mental Status Examination:  Appearance: {Appearance:22431} Behavior: {Behavior:22445} Mood: {gbmood:21757} Affect: {Affect:22436} Speech: {Speech:22432} Eye Contact: {Eye Contact:22433} Psychomotor Activity: {Motor Activity:22434} Thought Process: {thought process:22448}  Content/Perceptual Disturbances: {disturbances:22451} Orientation:  {Orientation:22437} Cognition/Sensorium: {gbcognition:22449} Insight: {Insight:22446} Judgment: {Insight:22446}  Structured Assessment Results: The Patient Health Questionnaire-9 (PHQ-9) is a self-report measure that assesses symptoms and severity of depression over the course of the last two weeks. Sophina obtained a score of *** suggesting {GBPHQ9SEVERITY:21752}. Xandrea finds the endorsed symptoms to be {gbphq9difficulty:21754}. Little interest or pleasure in doing things ***  Feeling down, depressed, or hopeless ***  Trouble falling or staying asleep, or sleeping too much ***  Feeling tired or having little energy ***  Poor appetite or overeating ***  Feeling bad about yourself --- or that you are a failure or have let yourself or your family down ***  Trouble concentrating on things, such as reading the newspaper or watching television ***  Moving or speaking so slowly that other people could have noticed? Or the opposite --- being so fidgety or restless that you have been moving around a lot more than usual ***  Thoughts that you would be better off dead or hurting yourself in some way ***  PHQ-9 Score ***    The Generalized Anxiety Disorder-7 (GAD-7) is a brief self-report measure that assesses symptoms of anxiety over the course of the last two weeks. Marti obtained a score of *** suggesting {gbgad7severity:21753}. Alandria finds the endorsed symptoms to be {gbphq9difficulty:21754}. Feeling nervous, anxious, on edge ***  Not being able to stop or control worrying ***  Worrying too much about different things ***  Trouble relaxing ***  Being so restless that it's hard to sit still ***  Becoming easily annoyed or irritable ***  Feeling afraid as if something awful might happen ***  GAD-7 Score ***   Interventions:  {Interventions:22172}  DSM-5 Diagnosis: 311 (F32.8) Other Specified Depressive Disorder, Emotional Eating Behaviors  Treatment Goal & Progress: During the initial  appointment with this provider, the following treatment goal was established: increase coping skills. Juliarose has demonstrated progress in her  goal as evidenced by {gbtxprogress:22839}. Ahnna also reported {gbtxprogress2:22951}.  Plan: Raegann continues to appear able and willing to participate as evidenced by engagement in reciprocal conversation, and asking questions for clarification as appropriate. The next appointment will be scheduled in {gbweeks:21758}, which will be via [employer]. The next session will focus on reviewing learned skills, and working towards the established treatment goal.***

## 2019-05-04 NOTE — Progress Notes (Signed)
Office: [phone]  /  Fax: [phone]   HPI:   Chief Complaint: OBESITY Vanessa Wilcox is here to discuss her progress with her obesity treatment plan. She is on the  follow the Category 2 plan and is following her eating plan approximately 40 % of the time. She states she is exercising by walking for 30 minutes 2 times per week. Vanessa Wilcox is down 1 lb. She has decreased her walking and has increased her water intake.  Her weight is (!) 305 lb (138.3 kg) today and has had a weight loss of 1 pounds over a period of 2 weeks since her last visit. She has lost 1 lb since starting treatment with [name].  Vitamin D deficiency Vanessa Wilcox has a diagnosis of vitamin D deficiency. She is currently taking vit D and denies nausea, vomiting or muscle weakness. Vitamin D level is 7.6.   At risk for osteopenia and osteoporosis Vanessa Wilcox is at higher risk of osteopenia and osteoporosis due to vitamin D deficiency.   Depression with emotional eating behaviors Vanessa Wilcox is struggling with emotional eating and using food for comfort to the extent that it is negatively impacting her health. She often snacks when she is not hungry. Vanessa Wilcox sometimes feels she is out of control and then feels guilty that she made poor food choices. She has been working on behavior modification techniques to help reduce her emotional eating and has been somewhat successful. She shows no sign of suicidal or homicidal ideations.  Depression screen Vanessa Wilcox  Decreased Interest 3 2  Down, Depressed, Hopeless 3 2  PHQ - 2 Score 6 4  Altered sleeping 3 3  Tired, decreased energy 3 2  Change in appetite 0 3  Feeling bad or failure about yourself  2 2  Trouble concentrating 2 0  Moving slowly or fidgety/restless 2 0  Suicidal thoughts 1 0  PHQ-9 Score 19 14  Difficult doing work/chores Very difficult -     ASSESSMENT AND PLAN:  Vitamin D deficiency - Plan: Vitamin D, Ergocalciferol, (DRISDOL) 1.25 MG (50000 UT) CAPS capsule   Other depression - with emotional eating   At risk for osteoporosis  Class 3 severe obesity due to excess calories with serious comorbidity and body mass index (BMI) of 50.0 to 59.9 in adult Vanessa Wilcox)  PLAN: Vitamin D Deficiency Vanessa Wilcox was informed that low vitamin D levels contributes to fatigue and are associated with obesity, breast, and colon cancer. She agrees to continue to take prescription Vit D @50 ,000 IU every week #4 with no refills and will follow up for routine testing of vitamin D, at least 2-3 times per year. She was informed of the risk of over-replacement of vitamin D and agrees to not increase her dose unless she discusses this with [name] first. Agrees to follow up with our clinic as directed.   At risk for osteopenia and osteoporosis Vanessa Wilcox was given extended  (15 minutes) osteoporosis prevention counseling today. Vanessa Wilcox is at risk for osteopenia and osteoporosis due to her vitamin D deficiency. She was encouraged to take her vitamin D and follow her higher calcium diet and increase strengthening exercise to help strengthen her bones and decrease her risk of osteopenia and osteoporosis.  Depression with Emotional Eating Behaviors We discussed behavior modification techniques today to help Vanessa Wilcox deal with her emotional eating and depression. She has agreed to continue seeing Vanessa Wilcox and agreed to follow up as directed.  Obesity Vanessa Wilcox is currently in the action stage of change. As  such, her goal is to continue with weight loss efforts She has agreed to follow the Category 2 plan Vanessa Wilcox has been instructed to work up to a goal of 150 minutes of combined cardio and strengthening exercise per week or will start walking with ladies in the neighborhood for weight loss and overall health benefits. We discussed the following Behavioral Modification Strategies today:increasing water intake, no skipping meals, keeping healthy foods in the home, no skipping meals, increasing lean protein  intake, decreasing simple carbohydrates , increasing vegetables, decrease eating out and work on meal planning and easy cooking plans   Vanessa Wilcox has agreed to follow up with our clinic in 2-3 weeks. She was informed of the importance of frequent follow up visits to maximize her success with intensive lifestyle modifications for her multiple health conditions.  ALLERGIES: No Known Allergies  MEDICATIONS: Current Outpatient Medications on File Prior to Visit  Medication Sig Dispense Refill  . CRYSELLE-28 0.3-30 MG-MCG tablet Take 1 tablet by mouth daily.     No current facility-administered medications on file prior to visit.     PAST MEDICAL HISTORY: Past Medical History:  Diagnosis Date  . Anxiety   . Back pain   . Depression   . Gestational hypertension   . High blood pressure     PAST SURGICAL HISTORY: Past Surgical History:  Procedure Laterality Date  . ASPIRATION BIOPSY     fluid around heart- negative results in 2000    SOCIAL HISTORY: Social History   Tobacco Use  . Smoking status: Never Smoker  . Smokeless tobacco: Never Used  Substance Use Topics  . Alcohol use: No  . Drug use: No    FAMILY HISTORY: Family History  Problem Relation Age of Onset  . Diabetes Mother   . High blood pressure Mother   . Cancer Mother   . Depression Mother   . Sleep apnea Mother   . Obesity Mother   . High Cholesterol Father   . Cancer Father   . Alcoholism Father     ROS: Review of Systems  Gastrointestinal: Negative for nausea and vomiting.  Musculoskeletal:       Negative for muscle weakness  Psychiatric/Behavioral: Positive for depression. Negative for suicidal ideas.    PHYSICAL EXAM: Pulse 85, temperature 99 F (37.2 C), height 5\' 3"  (1.6 m), weight (!) 305 lb (138.3 kg), SpO2 100 %. Body mass index is 54.03 kg/m. Physical Exam Vitals signs reviewed.  Constitutional:      Appearance: Normal appearance. She is obese.  HENT:     Head: Normocephalic.      Nose: Nose normal.  Neck:     Musculoskeletal: Normal range of motion.  Cardiovascular:     Rate and Rhythm: Normal rate.  Pulmonary:     Effort: Pulmonary effort is normal.  Musculoskeletal: Normal range of motion.  Skin:    General: Skin is warm and dry.  Neurological:     Mental Status: She is alert and oriented to person, place, and time.  Psychiatric:        Mood and Affect: Mood normal.        Behavior: Behavior normal.     RECENT LABS AND TESTS: BMET    Component Value Date/Time   NA 136 10/03/2016 1912   K 3.9 10/03/2016 1912   CL 104 10/03/2016 1912   CO2 22 10/03/2016 1912   GLUCOSE 111 (H) 10/03/2016 1912   BUN 6 10/03/2016 1912   CREATININE 0.66 10/03/2016 1912  CALCIUM 8.5 (L) 10/03/2016 1912   GFRNONAA >60 10/03/2016 1912   GFRAA >60 10/03/2016 1912   Lab Results  Component Value Date   HGBA1C 5.7 (H) 02/09/2019   Lab Results  Component Value Date   INSULIN 21.5 02/09/2019   CBC    Component Value Date/Time   WBC 13.3 (H) 10/03/2016 1912   RBC 4.54 10/03/2016 1912   HGB 11.9 (L) 10/03/2016 1912   HCT 35.5 (L) 10/03/2016 1912   PLT 272 10/03/2016 1912   MCV 78.2 10/03/2016 1912   MCH 26.2 10/03/2016 1912   MCHC 33.5 10/03/2016 1912   RDW 14.2 10/03/2016 1912   LYMPHSABS 2.0 08/11/2013 2247   MONOABS 0.5 08/11/2013 2247   EOSABS 0.1 08/11/2013 2247   BASOSABS 0.0 08/11/2013 2247   Iron/TIBC/Ferritin/ %Sat No results found for: IRON, TIBC, FERRITIN, IRONPCTSAT Lipid Panel  No results found for: CHOL, TRIG, HDL, CHOLHDL, VLDL, LDLCALC, LDLDIRECT Hepatic Function Panel     Component Value Date/Time   PROT 7.0 10/03/2016 1912   ALBUMIN 3.1 (L) 10/03/2016 1912   AST 17 10/03/2016 1912   ALT 13 (L) 10/03/2016 1912   ALKPHOS 56 10/03/2016 1912   BILITOT 0.6 10/03/2016 1912   No results found for: TSH   Ref. Range 02/09/2019 09:20  Vitamin D, 25-Hydroxy Latest Ref Range: 30.0 - 100.0 ng/mL 7.6 (L)     OBESITY BEHAVIORAL INTERVENTION  VISIT  Today's visit was # 6   Starting weight: 306 lbs Starting date: 02/09/19 Today's weight : Weight: (!) 305 lb (138.3 kg)  Today's date: 05/03/19 Total lbs lost to date: 1 lb At least 15 minutes were spent on discussing the following behavioral intervention visit.   ASK: We discussed the diagnosis of obesity with Vanessa Wilcox today and Vanessa Wilcox agreed to give [name] permission to discuss obesity behavioral modification therapy today.  ASSESS: Vanessa Wilcox has the diagnosis of obesity and her BMI today is 54.04 Vanessa Wilcox is in the action stage of change   ADVISE: Vanessa Wilcox was educated on the multiple health risks of obesity as well as the benefit of weight loss to improve her health. She was advised of the need for long term treatment and the importance of lifestyle modifications to improve her current health and to decrease her risk of future health problems.  AGREE: Multiple dietary modification options and treatment options were discussed and  Vanessa Wilcox agreed to follow the recommendations documented in the above note.  ARRANGE: Tyjah was educated on the importance of frequent visits to treat obesity as outlined per CMS and USPSTF guidelines and agreed to schedule her next follow up appointment today.  Leary Roca, am acting as transcriptionist for [employer], DO   I have reviewed the above documentation for accuracy and completeness, and I agree with the above. -Jearld Lesch, DO

## 2019-05-11 ENCOUNTER — Ambulatory Visit (INDEPENDENT_AMBULATORY_CARE_PROVIDER_SITE_OTHER): Payer: Self-pay | Admitting: Psychology

## 2019-05-11 ENCOUNTER — Telehealth (INDEPENDENT_AMBULATORY_CARE_PROVIDER_SITE_OTHER): Payer: Self-pay | Admitting: Psychology

## 2019-05-11 NOTE — Telephone Encounter (Signed)
  Office: [phone]  /  Fax: [phone]  Date of Call: May 11, 2019  Time of Call: 11:32am Duration of Call: 2 minutes Provider: Glennie Isle, PsyD  CONTENT: This provider called Vanessa Wilcox to check-in as she did not present for today's WebEx appointment at 11:30am. Vanessa Wilcox noted she forgot about today's appointment and further shared she was driving with a colleague in her car. As such, this provider recommended she reschedule today's appointment due to safety concerns about having a virtual visit while driving. Vanessa Wilcox was receptive and acknowledged understanding her one time no show fee waiver would be applied. A brief risk assessment was completed. Vanessa Wilcox denied experiencing suicidal and homicidal ideation, plan, or intent since the last appointment with this provider.   PLAN:  Vanessa Wilcox is scheduled for an appointment on May 15, 2019 at 8:00am via [employer].

## 2019-05-11 NOTE — Progress Notes (Signed)
Office: [phone]  /  Fax: [phone]    Date: May 15, 2019   Appointment Start Time: 8:04am Duration: 25 minutes Provider: Glennie Isle, Psy.D. Type of Session: Individual Therapy  Location of Patient: Parked in car at work Location of Provider: Provider's Home Type of Contact: Telepsychological Visit via [employer] Content: Of note, this provider called Vanessa Wilcox at 8:02am as she did not present for the WebEx appointment. A HIPAA compliant voicemail was left requesting a call back. She was then observed joining via WebEx. As such, today's appointment was initiated 4 minutes late. Vanessa Wilcox is a 38 y.o. female presenting via [place] for a follow-up appointment to address the previously established treatment goal of increasing coping skills. Today's appointment was a telepsychological visit, as it is an option for appointments to reduce exposure to COVID-19. Vanessa Wilcox expressed understanding regarding the rationale for telepsychological services, and provided verbal consent for today's appointment. Prior to proceeding with today's appointment, Vanessa Wilcox's physical location at the time of this appointment was obtained. In the event of technical difficulties, Vanessa Wilcox shared a phone number she could be reached at. Vanessa Wilcox and this provider participated in today's telepsychological service. Also, Vanessa Wilcox denied anyone else being present in the car or on the WebEx appointment.  This provider conducted a brief check-in and verbally administered the PHQ-9 and GAD-7. Vanessa Wilcox described feeling "discouraged" with her weight loss progress to date. She explained, "My losses are small and my gains are small." This was explored further. Vanessa Wilcox acknowledged she could "follow the meal better." Obstacles/barriers that may be preventing her from following the meal plan. She described having difficulty following the meal plan, focusing on her water intake, and walking and explained she can often only follow  one or other. Moncia also shared, "Other things tend to take priority." As such, psychoeducation regarding the importance of self-care utilizing the oxygen mask metaphor was provided. Moreover, mindfulness was reviewed as Vanessa Wilcox described experiencing self-judgment frequently. Notably, she reported she occasionally engages in mindfulness exercises (e.g., five senses), but again reflected other tasks take priority. To further assist with her practice, psychoeducation regarding formal (e.g., setting aside a specific time daily to engage in an exercise) and informal (e.g., cultivating awareness in the present moment and taking a non-judgmental approach while engaging in day-to-day tasks) mindfulness was provided. This provider also discussed the utilization of YouTube for mindfulness exercises (e.g., videos by Merri Ray). Overall, Vanessa Wilcox was receptive to today's session as evidenced by openness to sharing, responsiveness to feedback, and willingness to continue engagement in mindfulness exercises.   Mental Status Examination:  Appearance: neat Behavior: cooperative Mood: euthymic Affect: mood congruent Speech: normal in rate, volume, and tone Eye Contact: appropriate Psychomotor Activity: appropriate Thought Process: linear, logical, and goal directed  Content/Perceptual Disturbances: denies suicidal and homicidal ideation, plan, and intent and no hallucinations, delusions, bizarre thinking or behavior reported or observed Orientation: time, person, place and purpose of appointment Cognition/Sensorium: memory, attention, language, and fund of knowledge intact  Insight: good Judgment: good  Structured Assessment Results: The Patient Health Questionnaire-9 (PHQ-9) is a self-report measure that assesses symptoms and severity of depression over the course of the last two weeks. Vanessa Wilcox obtained a score of 2 suggesting minimal depression. Vanessa Wilcox finds the endorsed symptoms to be not difficult at all.  Little interest or pleasure in doing things 0  Feeling down, depressed, or hopeless 0  Trouble falling or staying asleep, or sleeping too much 0  Feeling tired or having little energy 1  Poor appetite or overeating 1  Feeling bad about yourself --- or that you are a failure or have let yourself or your family down 0  Trouble concentrating on things, such as reading the newspaper or watching television 0  Moving or speaking so slowly that other people could have noticed? Or the opposite --- being so fidgety or restless that you have been moving around a lot more than usual 0  Thoughts that you would be better off dead or hurting yourself in some way 0  PHQ-9 Score 2    The Generalized Anxiety Disorder-7 (GAD-7) is a brief self-report measure that assesses symptoms of anxiety over the course of the last two weeks. Vanessa Wilcox obtained a score of 2 suggesting minimal anxiety. Vanessa Wilcox finds the endorsed symptoms to be not difficult at all. Feeling nervous, anxious, on edge 0  Not being able to stop or control worrying 1  Worrying too much about different things 1  Trouble relaxing 0  Being so restless that it's hard to sit still 0  Becoming easily annoyed or irritable 0  Feeling afraid as if something awful might happen 0  GAD-7 Score 2   Interventions:  Conducted a brief chart review Verbal administration of PHQ-9 and GAD-7 for symptom monitoring Provided empathic reflections and validation Reviewed content from the previous session Psychoeducation provided regarding mindfulness Employed motivational interviewing skills to assess patient's willingness/desire to adhere to recommended medical treatments and assignments Employed supportive psychotherapy interventions to facilitate reduced distress, and to improve coping skills with identified stressors  Psychoeducation regarding self-care  DSM-5 Diagnosis: 311 (F32.8) Other Specified Depressive Disorder, Emotional Eating Behaviors  Treatment  Goal & Progress: During the initial appointment with this provider, the following treatment goal was established: increase coping skills. Vanessa Wilcox has demonstrated progress in her goal as evidenced by increased awareness of hunger patterns and triggers for emotional eating. Vanessa Wilcox also continues to demonstrate willingness to engage in learned skill(s).  Plan: Vanessa Wilcox continues to appear able and willing to participate as evidenced by engagement in reciprocal conversation, and asking questions for clarification as appropriate. The next appointment will be scheduled in two weeks, which will be via [employer]. The next session will focus on reviewing learned skills, and working towards the established treatment goal.

## 2019-05-15 ENCOUNTER — Ambulatory Visit (INDEPENDENT_AMBULATORY_CARE_PROVIDER_SITE_OTHER): Payer: BC Managed Care – PPO | Admitting: Psychology

## 2019-05-15 ENCOUNTER — Other Ambulatory Visit: Payer: Self-pay

## 2019-05-15 DIAGNOSIS — F3289 Other specified depressive episodes: Secondary | ICD-10-CM | POA: Diagnosis not present

## 2019-05-17 NOTE — Progress Notes (Unsigned)
Office: [phone]  /  Fax: [phone]    Date: May 29, 2019   Appointment Start Time: *** Duration: *** minutes Provider: Glennie Isle, Psy.D. Type of Session: Individual Therapy  Location of Patient: {gbptloc:23249} Location of Provider: {Location of Service:22491} Type of Contact: Telepsychological Visit via [employer] WebEx   Session Content: Of note, this provider called Vanessa Wilcox at 3:03pm as she did not present for the [facility] appointment. *** The e-mail with the secure link was re-sent. As such, today's appointment was initiated *** minutes late.  Vanessa Wilcox is a 38 y.o. female presenting via [place] for a follow-up appointment to address the previously established treatment goal of increasing coping skills. Today's appointment was a telepsychological visit, as it is an option for appointments to reduce exposure to COVID-19. Vanessa Wilcox expressed understanding regarding the rationale for telepsychological services, and provided verbal consent for today's appointment. Prior to proceeding with today's appointment, Vanessa Wilcox's physical location at the time of this appointment was obtained. In the event of technical difficulties, Vanessa Wilcox shared a phone number she could be reached at. Vanessa Wilcox and this provider participated in today's telepsychological service. Also, Vanessa Wilcox denied anyone else being present in the room or on the WebEx appointment ***.  This provider conducted a brief check-in and verbally administered the PHQ-9 and GAD-7. *** Vanessa Wilcox was receptive to today's session as evidenced by openness to sharing, responsiveness to feedback, and ***.  Mental Status Examination:  Appearance: {Appearance:22431} Behavior: {Behavior:22445} Mood: {gbmood:21757} Affect: {Affect:22436} Speech: {Speech:22432} Eye Contact: {Eye Contact:22433} Psychomotor Activity: {Motor Activity:22434} Thought Process: {thought process:22448}  Content/Perceptual Disturbances: {disturbances:22451} Orientation:  {Orientation:22437} Cognition/Sensorium: {gbcognition:22449} Insight: {Insight:22446} Judgment: {Insight:22446}  Structured Assessment Results: The Patient Health Questionnaire-9 (PHQ-9) is a self-report measure that assesses symptoms and severity of depression over the course of the last two weeks. Vanessa Wilcox obtained a score of *** suggesting {GBPHQ9SEVERITY:21752}. Vanessa Wilcox finds the endorsed symptoms to be {gbphq9difficulty:21754}. Vanessa Wilcox in doing things ***  Feeling down, depressed, or hopeless ***  Trouble falling or staying asleep, or sleeping too much ***  Feeling tired or having Vanessa energy ***  Poor appetite or overeating ***  Feeling bad about yourself --- or that you are a failure or have let yourself or your family down ***  Trouble concentrating on things, such as reading the newspaper or watching television ***  Moving or speaking so slowly that other people could have noticed? Or the opposite --- being so fidgety or restless that you have been moving around a lot more than usual ***  Thoughts that you would be better off dead or hurting yourself in some way ***  PHQ-9 Score ***    The Generalized Anxiety Disorder-7 (GAD-7) is a brief self-report measure that assesses symptoms of anxiety over the course of the last two weeks. Sharan obtained a score of *** suggesting {gbgad7severity:21753}. Charee finds the endorsed symptoms to be {gbphq9difficulty:21754}. Feeling nervous, anxious, on edge ***  Not being able to stop or control worrying ***  Worrying too much about different things ***  Trouble relaxing ***  Being so restless that it's hard to sit still ***  Becoming easily annoyed or irritable ***  Feeling afraid as if something awful might happen ***  GAD-7 Score ***   Interventions:  {Interventions:22172}  DSM-5 Diagnosis: 311 (F32.8) Other Specified Depressive Disorder, Emotional Eating Behaviors  Treatment Goal & Progress: During the initial  appointment with this provider, the following treatment goal was established: increase coping skills. Alberto has demonstrated progress in her goal  as evidenced by {gbtxprogress:22839}. Maliya also reported {gbtxprogress2:22951}.  Plan: Brandon continues to appear able and willing to participate as evidenced by engagement in reciprocal conversation, and asking questions for clarification as appropriate. The next appointment will be scheduled in {gbweeks:21758}, which will be via [employer]. The next session will focus on reviewing learned skills, and working towards the established treatment goal.***

## 2019-05-24 ENCOUNTER — Other Ambulatory Visit: Payer: Self-pay

## 2019-05-24 ENCOUNTER — Encounter (INDEPENDENT_AMBULATORY_CARE_PROVIDER_SITE_OTHER): Payer: Self-pay | Admitting: Bariatrics

## 2019-05-24 ENCOUNTER — Ambulatory Visit (INDEPENDENT_AMBULATORY_CARE_PROVIDER_SITE_OTHER): Payer: BC Managed Care – PPO | Admitting: Bariatrics

## 2019-05-24 VITALS — BP 131/91 | HR 92 | Temp 98.6°F | Ht 63.0 in | Wt 306.0 lb

## 2019-05-24 DIAGNOSIS — Z9189 Other specified personal risk factors, not elsewhere classified: Secondary | ICD-10-CM

## 2019-05-24 DIAGNOSIS — Z6841 Body Mass Index (BMI) 40.0 and over, adult: Secondary | ICD-10-CM

## 2019-05-24 DIAGNOSIS — F3289 Other specified depressive episodes: Secondary | ICD-10-CM | POA: Diagnosis not present

## 2019-05-24 DIAGNOSIS — E559 Vitamin D deficiency, unspecified: Secondary | ICD-10-CM | POA: Diagnosis not present

## 2019-05-24 MED ORDER — VITAMIN D (ERGOCALCIFEROL) 1.25 MG (50000 UNIT) PO CAPS: 50000.0000 [IU] | ORAL_CAPSULE | ORAL | 0 refills | Status: DC

## 2019-05-25 ENCOUNTER — Encounter (INDEPENDENT_AMBULATORY_CARE_PROVIDER_SITE_OTHER): Payer: Self-pay | Admitting: Bariatrics

## 2019-05-25 DIAGNOSIS — Z6841 Body Mass Index (BMI) 40.0 and over, adult: Secondary | ICD-10-CM | POA: Insufficient documentation

## 2019-05-25 NOTE — Progress Notes (Signed)
Office: [phone]  /  Fax: [phone]   HPI:   Chief Complaint: OBESITY Vanessa Wilcox is here to discuss her progress with her obesity treatment plan. She is on the Category 2 plan and is following her eating plan approximately 0% of the time. She states she is exercising 0 minutes 0 times per week. [name] has been struggling with "everything." She is up 1 lb.  Her weight is (!) 306 lb (138.8 kg) today and has had a weight gain of 1 lb since her last visit. She has lost 0 lbs since starting treatment with [name].  Vitamin D deficiency Vanessa Wilcox has a diagnosis of Vitamin D deficiency. She is currently taking prescription Vit D and denies nausea, vomiting or muscle weakness.  At risk for osteopenia and osteoporosis [name] is at higher risk of osteopenia and osteoporosis due to Vitamin D deficiency.   Depression, Other Vanessa Wilcox is struggling with emotional eating and using food for comfort to the extent that it is negatively impacting her health. She often snacks when she is not hungry. Vanessa Wilcox sometimes feels she is out of control and then feels guilty that she made poor food choices. She has been working on behavior modification techniques to help reduce her emotional eating and has been somewhat successful. Vanessa Wilcox has seen Dr. Mallie Mussel. She shows no sign of suicidal or homicidal ideations.  Depression screen [facility] 2/9 02/09/2019 10/05/2016  Decreased Interest 3 2  Down, Depressed, Hopeless 3 2  PHQ - 2 Score 6 4  Altered sleeping 3 3  Tired, decreased energy 3 2  Change in appetite 0 3  Feeling bad or failure about yourself  2 2  Trouble concentrating 2 0  Moving slowly or fidgety/restless 2 0  Suicidal thoughts 1 0  PHQ-9 Score 19 14  Difficult doing work/chores Very difficult -   ASSESSMENT AND PLAN:  Vitamin D deficiency - Plan: Vitamin D, Ergocalciferol, (DRISDOL) 1.25 MG (50000 UT) CAPS capsule  Other depression, emotional eating  At risk for osteoporosis  Class 3 severe obesity due to  excess calories with serious comorbidity and body mass index (BMI) of 50.0 to 59.9 in adult [facility])  PLAN:  Vitamin D Deficiency Vanessa Wilcox was informed that low Vitamin D levels contributes to fatigue and are associated with obesity, breast, and colon cancer. She agrees to continue to take prescription Vit D @ 50,000 IU every week #4 with 0 refills and will follow-up for routine testing of Vitamin D, at least 2-3 times per year. She was informed of the risk of over-replacement of Vitamin D and agrees to not increase her dose unless she discusses this with [name] first. Vanessa Wilcox agrees to follow-up with our clinic in 2 weeks.  At risk for osteopenia and osteoporosis Vanessa Wilcox was given extended  (15 minutes) osteoporosis prevention counseling today. Vanessa Wilcox is at risk for osteopenia and osteoporosis due to her Vitamin D deficiency. She was encouraged to take her Vitamin D and follow her higher calcium diet and increase strengthening exercise to help strengthen her bones and decrease her risk of osteopenia and osteoporosis.  Depression, Other  We discussed CBT techniques. She will follow-up as directed to monitor her progress.  Obesity Vanessa Wilcox is currently in the action stage of change. As such, her goal is to continue with weight loss efforts. She has agreed to follow the Category 2 plan. Vanessa Wilcox will work on meal planning, intentional eating, and increasing her water intake. Vanessa Wilcox has been instructed to walk for weight loss and overall health benefits.  We discussed the following Behavioral Modification Strategies today: increasing lean protein intake, decreasing simple carbohydrates, increasing vegetables, increase H20 intake, decrease eating out, no skipping meals, work on meal planning and easy cooking plans, keeping healthy foods in the home, holiday eating strategies, celebration eating strategies, and avoiding temptations.  Vanessa Wilcox has agreed to follow-up with our clinic in 2-3 weeks. She was informed of  the importance of frequent follow-up visits to maximize her success with intensive lifestyle modifications for her multiple health conditions.  ALLERGIES: No Known Allergies  MEDICATIONS: Current Outpatient Medications on File Prior to Visit  Medication Sig Dispense Refill  . CRYSELLE-28 0.3-30 MG-MCG tablet Take 1 tablet by mouth daily.     No current facility-administered medications on file prior to visit.     PAST MEDICAL HISTORY: Past Medical History:  Diagnosis Date  . Anxiety   . Back pain   . Depression   . Gestational hypertension   . High blood pressure     PAST SURGICAL HISTORY: Past Surgical History:  Procedure Laterality Date  . ASPIRATION BIOPSY     fluid around heart- negative results in 2000    SOCIAL HISTORY: Social History   Tobacco Use  . Smoking status: Never Smoker  . Smokeless tobacco: Never Used  Substance Use Topics  . Alcohol use: No  . Drug use: No    FAMILY HISTORY: Family History  Problem Relation Age of Onset  . Diabetes Mother   . High blood pressure Mother   . Cancer Mother   . Depression Mother   . Sleep apnea Mother   . Obesity Mother   . High Cholesterol Father   . Cancer Father   . Alcoholism Father    ROS: Review of Systems  Gastrointestinal: Negative for nausea and vomiting.  Musculoskeletal:       Negative for muscle weakness.  Psychiatric/Behavioral: Positive for depression. Negative for suicidal ideas.       Negative for homicidal ideas.   PHYSICAL EXAM: Blood pressure (!) 131/91, pulse 92, temperature 98.6 F (37 C), height 5\' 3"  (1.6 m), weight (!) 306 lb (138.8 kg), SpO2 100 %. Body mass index is 54.21 kg/m. Physical Exam Vitals signs reviewed.  Constitutional:      Appearance: Normal appearance. She is obese.  Cardiovascular:     Rate and Rhythm: Normal rate.     Pulses: Normal pulses.  Pulmonary:     Effort: Pulmonary effort is normal.     Breath sounds: Normal breath sounds.  Musculoskeletal:  Normal range of motion.  Skin:    General: Skin is warm and dry.  Neurological:     Mental Status: She is alert and oriented to person, place, and time.  Psychiatric:        Behavior: Behavior normal.   RECENT LABS AND TESTS: BMET    Component Value Date/Time   NA 136 10/03/2016 1912   K 3.9 10/03/2016 1912   CL 104 10/03/2016 1912   CO2 22 10/03/2016 1912   GLUCOSE 111 (H) 10/03/2016 1912   BUN 6 10/03/2016 1912   CREATININE 0.66 10/03/2016 1912   CALCIUM 8.5 (L) 10/03/2016 1912   GFRNONAA >60 10/03/2016 1912   GFRAA >60 10/03/2016 1912   Lab Results  Component Value Date   HGBA1C 5.7 (H) 02/09/2019   Lab Results  Component Value Date   INSULIN 21.5 02/09/2019   CBC    Component Value Date/Time   WBC 13.3 (H) 10/03/2016 1912   RBC 4.54 10/03/2016  1912   HGB 11.9 (L) 10/03/2016 1912   HCT 35.5 (L) 10/03/2016 1912   PLT 272 10/03/2016 1912   MCV 78.2 10/03/2016 1912   MCH 26.2 10/03/2016 1912   MCHC 33.5 10/03/2016 1912   RDW 14.2 10/03/2016 1912   LYMPHSABS 2.0 08/11/2013 2247   MONOABS 0.5 08/11/2013 2247   EOSABS 0.1 08/11/2013 2247   BASOSABS 0.0 08/11/2013 2247   Iron/TIBC/Ferritin/ %Sat No results found for: IRON, TIBC, FERRITIN, IRONPCTSAT Lipid Panel  No results found for: CHOL, TRIG, HDL, CHOLHDL, VLDL, LDLCALC, LDLDIRECT Hepatic Function Panel     Component Value Date/Time   PROT 7.0 10/03/2016 1912   ALBUMIN 3.1 (L) 10/03/2016 1912   AST 17 10/03/2016 1912   ALT 13 (L) 10/03/2016 1912   ALKPHOS 56 10/03/2016 1912   BILITOT 0.6 10/03/2016 1912   No results found for: TSH  Results for RAYELYN, SCHEIBLE (MRN [id number]) as of 05/25/2019 08:05  Ref. Range 02/09/2019 09:20  Vitamin D, 25-Hydroxy Latest Ref Range: 30.0 - 100.0 ng/mL 7.6 (L)   OBESITY BEHAVIORAL INTERVENTION VISIT  Today's visit was #7  Starting weight: 306 lbs  Starting date: 02/09/2019 Today's weight: 306 lbs  Today's date: 05/24/2019 Total lbs lost to date: 0     05/24/2019   Height 5\' 3"  (1.6 m)  Weight 306 lb (138.8 kg) (A)  BMI (Calculated) 54.22  BLOOD PRESSURE - SYSTOLIC [id number]  BLOOD PRESSURE - DIASTOLIC 91   Body Fat % [id number] %  Total Body Water (lbs) 100.3 lbs   ASK: We discussed the diagnosis of obesity with Johny Shock today and Julissa agreed to give [name] permission to discuss obesity behavioral modification therapy today.  ASSESS: Julieonna has the diagnosis of obesity and her BMI today is 54.2. Kately is in the action stage of change.   ADVISE: Sakiyah was educated on the multiple health risks of obesity as well as the benefit of weight loss to improve her health. She was advised of the need for long term treatment and the importance of lifestyle modifications to improve her current health and to decrease her risk of future health problems.  AGREE: Multiple dietary modification options and treatment options were discussed and  Shenea agreed to follow the recommendations documented in the above note.  ARRANGE: Dariah was educated on the importance of frequent visits to treat obesity as outlined per CMS and USPSTF guidelines and agreed to schedule her next follow up appointment today.  Migdalia Dk, am acting as Location manager for [employer], DO  I have reviewed the above documentation for accuracy and completeness, and I agree with the above. -Jearld Lesch, DO

## 2019-05-29 ENCOUNTER — Ambulatory Visit (INDEPENDENT_AMBULATORY_CARE_PROVIDER_SITE_OTHER): Payer: BC Managed Care – PPO | Admitting: Psychology

## 2019-05-29 ENCOUNTER — Telehealth (INDEPENDENT_AMBULATORY_CARE_PROVIDER_SITE_OTHER): Payer: Self-pay | Admitting: Psychology

## 2019-05-29 NOTE — Telephone Encounter (Signed)
  Office: [phone]  /  Fax: [phone]  Date of Call: May 29, 2019  Time of Call: 3:03pm Provider: Glennie Isle, PsyD  CONTENT: This provider called Vanessa Wilcox to check-in as she did not present for today's Webex appointment at 3:00pm. A HIPAA compliant voicemail was left requesting a call back. Of note, this provider stayed on the WebEx appointment for 5 minutes prior to signing off per the clinic's grace period policy.    PLAN: This provider will wait for [name] to call back. No further follow-up planned by this provider.

## 2019-06-12 NOTE — Progress Notes (Signed)
  Office: [phone]  /  Fax: [phone]    Date: June 20, 2019    Appointment Start Time: 11:09am Duration: 23 minutes Provider: Glennie Isle, Psy.D. Type of Session: Individual Therapy  Location of Patient: Home Location of Provider: [facility] Type of Contact: Telepsychological Visit via [employer] Web[employer]  Session Content: Vanessa Wilcox is a 38 y.o. female presenting via [place] for a follow-up appointment to address the previously established treatment goal of increasing coping skills. Today's appointment was a telepsychological visit due to COVID-19. Vanessa Wilcox provided verbal consent for today's telepsychological appointment and she is aware she is responsible for securing confidentiality on her end of the session. Prior to proceeding with today's appointment, Vanessa Wilcox's physical location at the time of this appointment was obtained as well a phone number she could be reached at in the event of technical difficulties. Vanessa Wilcox and this provider participated in today's telepsychological service.   This provider conducted a brief check-in. Vanessa Wilcox shared about recent events and noted, "I'm proud to report since Thanksgiving I've been following my plan 75% of the time." She also reported an increase in physical activity. Vanessa Wilcox further reported engaging in mindfulness exercises and stated, "It really does help." Positive reinforcement was provided. She discussed the benefits she has experienced since engaging in mindfulness exercises. As it relates to eating, Vanessa Wilcox reported an increased awareness of hunger patterns, which has allowed her to plan better. During today's appointment, Vanessa Wilcox was led through an additional exercise focusing on her breath. Her experience was processed after. Furthermore, termination planning was discussed. Vanessa Wilcox was receptive as evidenced by her stating, "I have the tools, I just have to use them." She was receptive to a follow-up appointment in 3-4 weeks  and an additional follow-up/termination in 3-4 weeks after that. Vanessa Wilcox was receptive to today's appointment as evidenced by openness to sharing, responsiveness to feedback, and willingness to engage in learned skills.  Mental Status Examination:  Appearance: well groomed and appropriate hygiene  Behavior: appropriate to circumstances Mood: euthymic Affect: mood congruent Speech: normal in rate, volume, and tone Eye Contact: appropriate Psychomotor Activity: appropriate Gait: unable to assess Thought Process: linear, logical, and goal directed  Thought Content/Perception: no hallucinations, delusions, bizarre thinking or behavior reported or observed and no evidence of suicidal and homicidal ideation, plan, and intent Orientation: time, person, place and purpose of appointment Memory/Concentration: memory, attention, language, and fund of knowledge intact  Insight/Judgment: good  Interventions:  Conducted a brief chart review Provided empathic reflections and validation Reviewed content from the previous session Provided positive reinforcement Employed supportive psychotherapy interventions to facilitate reduced distress and to improve coping skills with identified stressors Employed motivational interviewing skills to assess patient's willingness/desire to adhere to recommended medical treatments and assignments Engaged patient in mindfulness exercise(s) Employed acceptance and commitment interventions to emphasize mindfulness and acceptance without struggle Discussed termination planning  DSM-5 Diagnosis: 311 (F32.8) Other Specified Depressive Disorder, Emotional Eating Behaviors  Treatment Goal & Progress: During the initial appointment with this provider, the following treatment goal was established: increase coping skills. Vanessa Wilcox has demonstrated progress in her goal as evidenced by increased awareness of hunger patterns and increased awareness of triggers for emotional eating.  Vanessa Wilcox also continues to demonstrate willingness to engage in learned skill(s).  Plan: The next appointment will be scheduled in one month, which will be via [employer]. The next session will focus on working towards the established treatment goal.

## 2019-06-14 ENCOUNTER — Ambulatory Visit (INDEPENDENT_AMBULATORY_CARE_PROVIDER_SITE_OTHER): Payer: BC Managed Care – PPO | Admitting: Bariatrics

## 2019-06-20 ENCOUNTER — Ambulatory Visit (INDEPENDENT_AMBULATORY_CARE_PROVIDER_SITE_OTHER): Payer: BC Managed Care – PPO | Admitting: Psychology

## 2019-06-20 ENCOUNTER — Other Ambulatory Visit: Payer: Self-pay | Admitting: Obstetrics & Gynecology

## 2019-06-20 ENCOUNTER — Other Ambulatory Visit: Payer: Self-pay

## 2019-06-20 DIAGNOSIS — F3289 Other specified depressive episodes: Secondary | ICD-10-CM | POA: Diagnosis not present

## 2019-06-22 ENCOUNTER — Encounter (INDEPENDENT_AMBULATORY_CARE_PROVIDER_SITE_OTHER): Payer: Self-pay | Admitting: Bariatrics

## 2019-06-22 ENCOUNTER — Ambulatory Visit (INDEPENDENT_AMBULATORY_CARE_PROVIDER_SITE_OTHER): Payer: BC Managed Care – PPO | Admitting: Bariatrics

## 2019-06-22 ENCOUNTER — Other Ambulatory Visit: Payer: Self-pay

## 2019-06-22 VITALS — BP 126/88 | HR 100 | Temp 98.7°F | Ht 63.0 in | Wt 305.0 lb

## 2019-06-22 DIAGNOSIS — E559 Vitamin D deficiency, unspecified: Secondary | ICD-10-CM | POA: Diagnosis not present

## 2019-06-22 DIAGNOSIS — R7303 Prediabetes: Secondary | ICD-10-CM

## 2019-06-22 DIAGNOSIS — Z9189 Other specified personal risk factors, not elsewhere classified: Secondary | ICD-10-CM

## 2019-06-22 DIAGNOSIS — Z6841 Body Mass Index (BMI) 40.0 and over, adult: Secondary | ICD-10-CM

## 2019-06-22 MED ORDER — VITAMIN D (ERGOCALCIFEROL) 1.25 MG (50000 UNIT) PO CAPS: 50000.0000 [IU] | ORAL_CAPSULE | ORAL | 0 refills | Status: DC

## 2019-06-26 ENCOUNTER — Encounter (INDEPENDENT_AMBULATORY_CARE_PROVIDER_SITE_OTHER): Payer: Self-pay | Admitting: Bariatrics

## 2019-06-26 NOTE — Progress Notes (Signed)
Office: [phone]  /  Fax: [phone]   HPI:  Chief Complaint: OBESITY Vanessa Wilcox is here to discuss her progress with her obesity treatment plan. She is on the Category 2 plan and states she is following her eating plan approximately 65% of the time. She states she is walking 30 minutes 5 times per week.  Vanessa Wilcox's weight is down 1 lb overall, but according to the bio-impedance she is up 1 lb of water.  Today's visit was #8 Starting weight: 306 lbs Starting date: 02/09/2019 Today's weight: 305 lbs Today's date: 06/22/2019 Total lbs lost to date: 1 Total lbs lost since last in-office visit: 1  Vitamin D deficiency Vanessa Wilcox has a diagnosis of Vitamin D deficiency and is taking prescription Vitamin D. Last Vitamin D 7.6 on 02/09/2019. No nausea, vomiting, or muscle weakness.  At risk for osteopenia and osteoporosis Vanessa Wilcox is at higher risk of osteopenia and osteoporosis due to Vitamin D deficiency.   Prediabetes Vanessa Wilcox has a diagnosis of prediabetes and is on no medications. Last A1c 5.7 on 02/09/2019 with an insulin of 21.5.  ASSESSMENT AND PLAN:  Vitamin D deficiency - Plan: Vitamin D, Ergocalciferol, (DRISDOL) 1.25 MG (50000 UT) CAPS capsule  Prediabetes  At risk for osteoporosis  Class 3 severe obesity due to excess calories with serious comorbidity and body mass index (BMI) of 50.0 to 59.9 in adult [facility])  PLAN:  Vitamin D Deficiency Vanessa Wilcox was informed that low Vitamin D levels contributes to fatigue and are associated with obesity, breast, and colon cancer. She agrees to continue to take prescription Vit D @ 50,000 IU every week #4 with 0 refills and will follow-up for routine testing of Vitamin D, at least 2-3 times per year. She was informed of the risk of over-replacement of Vitamin D and agrees to not increase her dose unless she discusses this with [name] first. Vanessa Wilcox agrees to follow-up with our clinic in 3 weeks.  At risk for osteopenia and osteoporosis Vanessa Wilcox was  given extended  (15 minutes) osteoporosis prevention counseling today. Vanessa Wilcox is at risk for osteopenia and osteoporosis due to her Vitamin D deficiency. She was encouraged to take her Vitamin D and follow her higher calcium diet and increase strengthening exercise to help strengthen her bones and decrease her risk of osteopenia and osteoporosis.  Pre-Diabetes Vanessa Wilcox will continue to work on weight loss, exercise, decrease carbohydrates, increase healthy fats and protein to help decrease the risk of diabetes.   Obesity Vanessa Wilcox is currently in the action stage of change. As such, her goal is to continue with weight loss efforts. She has agreed to follow the Category 2 plan. Vanessa Wilcox will work on meal planning, intentional eating, increasing her protein, increasing vegetables, and decreasing carbohydrates. Vanessa Wilcox has been instructed to increase activity, walking with ladies in the neighborhood for weight loss and overall health benefits. We discussed the following Behavioral Modification Strategies today: increasing lean protein intake, decreasing simple carbohydrates, increasing vegetables, increase H20 intake, decrease eating out, no skipping meals, work on meal planning and easy cooking plans, and keeping healthy foods in the home.  Vanessa Wilcox has agreed to follow-up with our clinic in 3 weeks. She was informed of the importance of frequent follow-up visits to maximize her success with intensive lifestyle modifications for her multiple health conditions.  ALLERGIES: No Known Allergies  MEDICATIONS: Current Outpatient Medications on File Prior to Visit  Medication Sig Dispense Refill  . CRYSELLE-28 0.3-30 MG-MCG tablet TAKE 1 TABLET BY MOUTH EVERY DAY 84 tablet 3  No current facility-administered medications on file prior to visit.    PAST MEDICAL HISTORY: Past Medical History:  Diagnosis Date  . Anxiety   . Back pain   . Depression   . Gestational hypertension   . High blood pressure      PAST SURGICAL HISTORY: Past Surgical History:  Procedure Laterality Date  . ASPIRATION BIOPSY     fluid around heart- negative results in 2000    SOCIAL HISTORY: Social History   Tobacco Use  . Smoking status: Never Smoker  . Smokeless tobacco: Never Used  Substance Use Topics  . Alcohol use: No  . Drug use: No    FAMILY HISTORY: Family History  Problem Relation Age of Onset  . Diabetes Mother   . High blood pressure Mother   . Cancer Mother   . Depression Mother   . Sleep apnea Mother   . Obesity Mother   . High Cholesterol Father   . Cancer Father   . Alcoholism Father    ROS: Review of Systems  Constitutional: Positive for weight loss.  Gastrointestinal: Negative for nausea and vomiting.  Musculoskeletal:       Negative for muscle weakness.   PHYSICAL EXAM: Blood pressure 126/88, pulse 100, temperature 98.7 F (37.1 C), height 5\' 3"  (1.6 m), weight (!) 305 lb (138.3 kg), SpO2 99 %. Body mass index is 54.03 kg/m. Physical Exam Vitals reviewed.  Constitutional:      Appearance: Normal appearance. She is obese.  Cardiovascular:     Rate and Rhythm: Normal rate.     Pulses: Normal pulses.  Pulmonary:     Effort: Pulmonary effort is normal.     Breath sounds: Normal breath sounds.  Musculoskeletal:        General: Normal range of motion.  Skin:    General: Skin is warm and dry.  Neurological:     Mental Status: She is alert and oriented to person, place, and time.  Psychiatric:        Behavior: Behavior normal.   RECENT LABS AND TESTS: BMET    Component Value Date/Time   NA 136 10/03/2016 1912   K 3.9 10/03/2016 1912   CL 104 10/03/2016 1912   CO2 22 10/03/2016 1912   GLUCOSE 111 (H) 10/03/2016 1912   BUN 6 10/03/2016 1912   CREATININE 0.66 10/03/2016 1912   CALCIUM 8.5 (L) 10/03/2016 1912   GFRNONAA >60 10/03/2016 1912   GFRAA >60 10/03/2016 1912   Lab Results  Component Value Date   HGBA1C 5.7 (H) 02/09/2019   Lab Results   Component Value Date   INSULIN 21.5 02/09/2019   CBC    Component Value Date/Time   WBC 13.3 (H) 10/03/2016 1912   RBC 4.54 10/03/2016 1912   HGB 11.9 (L) 10/03/2016 1912   HCT 35.5 (L) 10/03/2016 1912   PLT 272 10/03/2016 1912   MCV 78.2 10/03/2016 1912   MCH 26.2 10/03/2016 1912   MCHC 33.5 10/03/2016 1912   RDW 14.2 10/03/2016 1912   LYMPHSABS 2.0 08/11/2013 2247   MONOABS 0.5 08/11/2013 2247   EOSABS 0.1 08/11/2013 2247   BASOSABS 0.0 08/11/2013 2247   Iron/TIBC/Ferritin/ %Sat No results found for: IRON, TIBC, FERRITIN, IRONPCTSAT Lipid Panel  No results found for: CHOL, TRIG, HDL, CHOLHDL, VLDL, LDLCALC, LDLDIRECT Hepatic Function Panel     Component Value Date/Time   PROT 7.0 10/03/2016 1912   ALBUMIN 3.1 (L) 10/03/2016 1912   AST 17 10/03/2016 1912   ALT 13 (  L) 10/03/2016 1912   ALKPHOS 56 10/03/2016 1912   BILITOT 0.6 10/03/2016 1912   No results found for: TSH  OBESITY BEHAVIORAL INTERVENTION VISIT DOCUMENTATION FOR INSURANCE (~15 minutes)  I, Michaelene Song, am acting as Location manager for [employer], DO   I have reviewed the above documentation for accuracy and completeness, and I agree with the above. -Jearld Lesch, DO

## 2019-07-11 NOTE — Progress Notes (Signed)
  Office: [phone]  /  Fax: [phone]    Date: July 24, 2019   Appointment Start Time: 2:35pm Duration: 22 minutes Provider: Glennie Isle, Psy.D. Type of Session: Individual Therapy  Location of Patient: Parked in car outside of pizza place Location of Provider: [facility] Type of Contact: Telepsychological Visit via [employer] WebEx  Session Content: This provider called Seth Bake at 2:32pm as she did not present for the WebEx appointment. The e-mail with the secure link was re-sent. As such, today's appointment was initiated 5 minutes late. Denielle is a 39 y.o. female presenting via [place] for a follow-up appointment to address the previously established treatment goal of increasing coping skills. Today's appointment was a telepsychological visit due to COVID-19. Kimberlye provided verbal consent for today's telepsychological appointment and she is aware she is responsible for securing confidentiality on her end of the session. Prior to proceeding with today's appointment, Iza's physical location at the time of this appointment was obtained as well a phone number she could be reached at in the event of technical difficulties. Delon and this provider participated in today's telepsychological service.   This provider conducted a brief check-in. Arcadia shared about recent events, including her grandmother passing away. Regarding eating, Tinie noted she "got back on track after Christmas." Dalayna further shared engagement in mindfulness exercises, adding it has helped reduce emotional eating. Positive reinforcement was provided. She was led through a mindfulness exercise (A Taste of Mindfulness) during today's appointment. Her experience was processed after the exercise. Carly provided verbal consent during today's appointment for this provider to send a handout for today's exercise via e-mail. Overall, Tremya was receptive to today's appointment as evidenced by openness  to sharing, responsiveness to feedback, and willingness to continue engaging in mindfulness exercises to assist with coping.  Mental Status Examination:  Appearance: well groomed and appropriate hygiene  Behavior: appropriate to circumstances Mood: euthymic Affect: mood congruent Speech: normal in rate, volume, and tone Eye Contact: appropriate Psychomotor Activity: appropriate Gait: unable to assess Thought Process: linear, logical, and goal directed  Thought Content/Perception: no hallucinations, delusions, bizarre thinking or behavior reported or observed and no evidence of suicidal and homicidal ideation, plan, and intent Orientation: time, person, place and purpose of appointment Memory/Concentration: memory, attention, language, and fund of knowledge intact  Insight/Judgment: good  Interventions:  Conducted a brief chart review Provided empathic reflections and validation Provided positive reinforcement Employed supportive psychotherapy interventions to facilitate reduced distress and to improve coping skills with identified stressors Employed motivational interviewing skills to assess patient's willingness/desire to adhere to recommended medical treatments and assignments Engaged patient in mindfulness exercise(s) Employed acceptance and commitment interventions to emphasize mindfulness and acceptance without struggle  DSM-5 Diagnosis: 311 (F32.8) Other Specified Depressive Disorder, Emotional Eating Behaviors  Treatment Goal & Progress: During the initial appointment with this provider, the following treatment goal was established: increase coping skills. Shamaree has demonstrated progress in her goal as evidenced by increased awareness of hunger patterns, increased awareness of triggers for emotional eating and reduction in emotional eating. Fikisha also demonstrates willingness to engage in mindfulness exercises.  Plan: The next appointment will be scheduled in 3-4 weeks, which  will be via [employer]. The next session will focus on reviewing learned skills and termination.

## 2019-07-13 ENCOUNTER — Ambulatory Visit (INDEPENDENT_AMBULATORY_CARE_PROVIDER_SITE_OTHER): Payer: BC Managed Care – PPO | Admitting: Family Medicine

## 2019-07-13 ENCOUNTER — Other Ambulatory Visit: Payer: Self-pay

## 2019-07-13 VITALS — BP 137/86 | HR 96 | Temp 98.5°F | Ht 63.0 in | Wt 303.0 lb

## 2019-07-13 DIAGNOSIS — Z9189 Other specified personal risk factors, not elsewhere classified: Secondary | ICD-10-CM | POA: Diagnosis not present

## 2019-07-13 DIAGNOSIS — F3289 Other specified depressive episodes: Secondary | ICD-10-CM | POA: Diagnosis not present

## 2019-07-13 DIAGNOSIS — R7303 Prediabetes: Secondary | ICD-10-CM

## 2019-07-13 DIAGNOSIS — E559 Vitamin D deficiency, unspecified: Secondary | ICD-10-CM | POA: Diagnosis not present

## 2019-07-13 DIAGNOSIS — Z6841 Body Mass Index (BMI) 40.0 and over, adult: Secondary | ICD-10-CM

## 2019-07-13 MED ORDER — VITAMIN D (ERGOCALCIFEROL) 1.25 MG (50000 UNIT) PO CAPS: 50000.0000 [IU] | ORAL_CAPSULE | ORAL | 0 refills | Status: DC

## 2019-07-14 NOTE — Progress Notes (Signed)
Chief Complaint: OBESITY Vanessa Wilcox is here to discuss her progress with her obesity treatment plan along with follow-up of her obesity related diagnoses. Vanessa Wilcox is on the Category 2 Plan and states she is following her eating plan approximately 45% of the time. Vanessa Wilcox states she is walking 12,000 steps per week.  Today's visit was #: 9 Starting weight: 306 lbs Starting date: 02/09/2019 Today's weight: 303 lbs Today's date: 07/13/2019 Total lbs lost to date: 3 lbs Total lbs lost since last in-office visit: 2 lbs  Interim History: Vanessa Wilcox says she got to sleep over the holidays.  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  She says she is not drinking enough water.    Subjective:   1. Vitamin D deficiency She's Vitamin D level was 7.6 on 02/09/2019. She is currently taking vit D. She denies nausea, vomiting or muscle weakness.  2. Prediabetes Vanessa Wilcox continues to work on weight loss, exercise, and decreasing simple carbohydrates to help decrease the risk of diabetes.   Lab Results  Component Value Date   HGBA1C 5.7 (H) 02/09/2019   Lab Results  Component Value Date   CREATININE 0.66 10/03/2016   3. Other depression, with emotional eating Vanessa Wilcox is struggling with emotional eating and using food for comfort to the extent that it is negatively impacting her health. She has been working on behavior modification techniques to help reduce her emotional eating and has been unsuccessful. She shows no sign of suicidal or homicidal ideations.  Assessment/Plan:   1. Vitamin D deficiency Low Vitamin D level contributes to fatigue and are associated with obesity, breast, and colon cancer. She agrees to continue to take prescription Vitamin D @50 ,000 IU every week and will follow-up for routine testing of vitamin D, at least 2-3 times per year to avoid over-replacement.  Orders - Vitamin D, Ergocalciferol, (DRISDOL) 1.25 MG (50000 UT) CAPS capsule; Take 1 capsule (50,000 Units total)  by mouth every 7 (seven) days.  Dispense: 4 capsule; Refill: 0  2. Prediabetes Vanessa Wilcox will continue to work on weight loss, exercise, and decreasing simple carbohydrates to help decrease the risk of diabetes.   3. Other depression, with emotional eating Behavior modification techniques were discussed today to help Vanessa Wilcox deal with her emotional/non-hunger eating behaviors.  Orders and follow up as documented in patient record.   4. At risk for diabetes mellitus Vanessa Wilcox is a 39 y.o. female and has risk factors for diabetes including obesity. We discussed intensive lifestyle modifications today with an emphasis on weight loss as well as increasing exercise and decreasing simple carbohydrates in her diet. (15 minutes)  5. Class 3 severe obesity due to excess calories with serious comorbidity and body mass index (BMI) of 50.0 to 59.9 in adult [facility]) Vanessa Wilcox is currently in the action stage of change. As such, her goal is to continue with weight loss efforts. She has agreed to on the Category 2 Plan.   We discussed the following exercise goals today: For substantial health benefits, adults should do at least 150 minutes (2 hours and 30 minutes) a week of moderate-intensity, or 75 minutes (1 hour and 15 minutes) a week of vigorous-intensity aerobic physical activity, or an equivalent combination of moderate- and vigorous-intensity aerobic activity. Aerobic activity should be performed in episodes of at least 10 minutes, and preferably, it should be spread throughout the week. Adults should also include muscle-strengthening activities that involve all major muscle groups on 2 or more days a week.  We discussed the following behavioral modification strategies today: increasing lean protein intake and increasing water intake to 64 ounces per day.  Vanessa Wilcox has agreed to follow-up with our clinic in 2 weeks. She was informed of the importance of frequent follow-up visits to maximize her success with  intensive lifestyle modifications for her multiple health conditions.  Objective:   Blood pressure 137/86, pulse 96, temperature 98.5 F (36.9 C), temperature source Oral, height 5\' 3"  (1.6 m), weight (!) 303 lb (137.4 kg), last menstrual period 06/22/2019, SpO2 100 %. Body mass index is 53.67 kg/m.  General: Cooperative, alert, well developed, in no acute distress. HEENT: Conjunctivae and lids unremarkable. Neck: No thyromegaly.  Cardiovascular: Regular rhythm.  Lungs: Normal work of breathing. Extremities: No edema.  Neurologic: No focal deficits.   Lab Results  Component Value Date   CREATININE 0.66 10/03/2016   BUN 6 10/03/2016   NA 136 10/03/2016   K 3.9 10/03/2016   CL 104 10/03/2016   CO2 22 10/03/2016   Lab Results  Component Value Date   ALT 13 (L) 10/03/2016   AST 17 10/03/2016   ALKPHOS 56 10/03/2016   BILITOT 0.6 10/03/2016   Lab Results  Component Value Date   HGBA1C 5.7 (H) 02/09/2019   Lab Results  Component Value Date   INSULIN 21.5 02/09/2019   Lab Results  Component Value Date   WBC 13.3 (H) 10/03/2016   HGB 11.9 (L) 10/03/2016   HCT 35.5 (L) 10/03/2016   MCV 78.2 10/03/2016   PLT 272 10/03/2016   Attestation Statements:   Reviewed by clinician on day of visit: allergies, medications, problem list, medical history, surgical history, family history, social history, and previous encounter notes.  This visit occurred during the SARS-CoV-2 public health emergency. Safety protocols were in place, including screening questions prior to the visit, additional usage of staff PPE, and extensive cleaning of exam room while observing appropriate contact time as indicated for disinfecting solutions.  I, Water quality scientist, CMA, am acting as Location manager for [employer], DO.  I have reviewed the above documentation for accuracy and completeness, and I agree with the above. Briscoe Deutscher, DO

## 2019-07-18 ENCOUNTER — Other Ambulatory Visit: Payer: Self-pay | Admitting: Family Medicine

## 2019-07-18 ENCOUNTER — Other Ambulatory Visit (HOSPITAL_COMMUNITY)
Admission: RE | Admit: 2019-07-18 | Discharge: 2019-07-18 | Disposition: A | Discharge status: Home / Self Care | Payer: BC Managed Care – PPO | Source: Ambulatory Visit | Attending: Family Medicine | Admitting: Family Medicine

## 2019-07-18 ENCOUNTER — Ambulatory Visit (INDEPENDENT_AMBULATORY_CARE_PROVIDER_SITE_OTHER): Payer: Self-pay | Admitting: Psychology

## 2019-07-18 DIAGNOSIS — Z0000 Encounter for general adult medical examination without abnormal findings: Secondary | ICD-10-CM | POA: Diagnosis not present

## 2019-07-19 ENCOUNTER — Encounter (INDEPENDENT_AMBULATORY_CARE_PROVIDER_SITE_OTHER): Payer: Self-pay | Admitting: Family Medicine

## 2019-07-21 LAB — CYTOLOGY - PAP
Comment: NEGATIVE
Diagnosis: NEGATIVE
High risk HPV: NEGATIVE

## 2019-07-24 ENCOUNTER — Ambulatory Visit (INDEPENDENT_AMBULATORY_CARE_PROVIDER_SITE_OTHER): Payer: BC Managed Care – PPO | Admitting: Psychology

## 2019-07-24 ENCOUNTER — Other Ambulatory Visit: Payer: Self-pay

## 2019-07-24 DIAGNOSIS — F3289 Other specified depressive episodes: Secondary | ICD-10-CM | POA: Diagnosis not present

## 2019-08-02 ENCOUNTER — Other Ambulatory Visit: Payer: Self-pay

## 2019-08-02 ENCOUNTER — Encounter (INDEPENDENT_AMBULATORY_CARE_PROVIDER_SITE_OTHER): Payer: Self-pay | Admitting: Family Medicine

## 2019-08-02 ENCOUNTER — Ambulatory Visit (INDEPENDENT_AMBULATORY_CARE_PROVIDER_SITE_OTHER): Payer: BC Managed Care – PPO | Admitting: Family Medicine

## 2019-08-02 VITALS — BP 135/89 | HR 85 | Temp 98.8°F | Ht 63.0 in | Wt 302.0 lb

## 2019-08-02 DIAGNOSIS — R7303 Prediabetes: Secondary | ICD-10-CM

## 2019-08-02 DIAGNOSIS — F3289 Other specified depressive episodes: Secondary | ICD-10-CM

## 2019-08-02 DIAGNOSIS — R718 Other abnormality of red blood cells: Secondary | ICD-10-CM

## 2019-08-02 DIAGNOSIS — E559 Vitamin D deficiency, unspecified: Secondary | ICD-10-CM | POA: Diagnosis not present

## 2019-08-02 DIAGNOSIS — Z9189 Other specified personal risk factors, not elsewhere classified: Secondary | ICD-10-CM

## 2019-08-02 DIAGNOSIS — Z6841 Body Mass Index (BMI) 40.0 and over, adult: Secondary | ICD-10-CM

## 2019-08-02 MED ORDER — VITAMIN D (ERGOCALCIFEROL) 1.25 MG (50000 UNIT) PO CAPS: 50000.0000 [IU] | ORAL_CAPSULE | ORAL | 0 refills | Status: DC

## 2019-08-03 NOTE — Progress Notes (Signed)
Chief Complaint:   OBESITY Vanessa Wilcox is here to discuss her progress with her obesity treatment plan along with follow-up of her obesity related diagnoses. Vanessa Wilcox is on the Category 2 Plan and states she is following her eating plan approximately 35% of the time. Vanessa Wilcox states she is walking for 20 minutes 5 times per week.  Today's visit was #: 10 Starting weight: 306 lbs Starting date: 02/09/2019 Today's weight: 302 lbs Today's date: 08/02/2019 Total lbs lost to date: 4 lbs Total lbs lost since last in-office visit: 1 lb   Subjective:   1. Prediabetes Vanessa Wilcox has a diagnosis of prediabetes based on her elevated HgA1c and was informed this puts her at greater risk of developing diabetes. She continues to work on diet and exercise to decrease her risk of diabetes. She denies nausea or hypoglycemia.    Lab Results  Component Value Date   HGBA1C 5.7 (H) 02/09/2019   Lab Results  Component Value Date   INSULIN 21.5 02/09/2019   2. Vitamin D deficiency Vanessa Wilcox's Vitamin D level was 7.6 on 02/09/2019. She is currently taking vit D. She denies nausea, vomiting or muscle weakness.  3. 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  4. Other depression, with emotional eating  Vanessa Wilcox is struggling with emotional eating and using food for comfort to the extent that it is negatively impacting her health. She has been working on behavior modification techniques to help reduce her emotional eating and has been unsuccessful. She shows no sign of suicidal or homicidal ideations.  5. At risk for diabetes mellitus Vanessa Wilcox is at higher than average risk for developing diabetes due to her obesity.   Assessment/Plan:   1. Prediabetes Vanessa Wilcox will continue to work on weight loss, exercise, and decreasing simple carbohydrates to help decrease the risk of diabetes.   2. Vitamin D deficiency Low Vitamin D level contributes to fatigue and are associated with obesity, breast, and  colon cancer. She agrees to continue to take prescription Vitamin D @50 ,000 IU every week and will follow-up for routine testing of Vitamin D, at least 2-3 times per year to avoid over-replacement.  Orders - Vitamin D, Ergocalciferol, (DRISDOL) 1.25 MG (50000 UNIT) CAPS capsule; Take 1 capsule (50,000 Units total) by mouth every 7 (seven) days.  Dispense: 4 capsule; Refill: 0  3. Low mean corpuscular volume (MCV) Will get anemia panel in the future.  4. Other depression, with emotional eating  The patient is currently working with Dr. Mallie Mussel.  5. At risk for diabetes mellitus Good blood sugar control is important to decrease the likelihood of diabetic complications such as nephropathy, neuropathy, limb loss, blindness, coronary artery disease, and death. Intensive lifestyle modification including diet, exercise and weight loss are the first line of treatment for diabetes.   6. Class 3 severe obesity with serious comorbidity and body mass index (BMI) of 50.0 to 59.9 in adult, unspecified obesity type [facility]) Vanessa Wilcox is currently in the action stage of change. As such, her goal is to continue with weight loss efforts. She has agreed to the Category 2 Plan.   Exercise goals: For substantial health benefits, adults should do at least 150 minutes (2 hours and 30 minutes) a week of moderate-intensity, or 75 minutes (1 hour and 15 minutes) a week of vigorous-intensity aerobic physical activity, or an equivalent combination of moderate- and vigorous-intensity aerobic activity. Aerobic activity should be performed in episodes of at least 10 minutes, and preferably, it should be  spread throughout the week. Adults should also include muscle-strengthening activities that involve all major muscle groups on 2 or more days a week.  Behavioral modification strategies: increasing lean protein intake, decreasing simple carbohydrates, increasing vegetables and increasing water intake.  Vanessa Wilcox has agreed to follow-up  with our clinic in 2 weeks. She was informed of the importance of frequent follow-up visits to maximize her success with intensive lifestyle modifications for her multiple health conditions.   Objective:   Blood pressure 135/89, pulse 85, temperature 98.8 F (37.1 C), temperature source Oral, height 5\' 3"  (1.6 m), weight (!) 302 lb (137 kg), last menstrual period 07/19/2019, SpO2 98 %. Body mass index is 53.5 kg/m.  General: Cooperative, alert, well developed, in no acute distress. HEENT: Conjunctivae and lids unremarkable. Cardiovascular: Regular rhythm.  Lungs: Normal work of breathing. Neurologic: No focal deficits.   Lab Results  Component Value Date   CREATININE 0.66 10/03/2016   BUN 6 10/03/2016   NA 136 10/03/2016   K 3.9 10/03/2016   CL 104 10/03/2016   CO2 22 10/03/2016   Lab Results  Component Value Date   ALT 13 (L) 10/03/2016   AST 17 10/03/2016   ALKPHOS 56 10/03/2016   BILITOT 0.6 10/03/2016   Lab Results  Component Value Date   HGBA1C 5.7 (H) 02/09/2019   Lab Results  Component Value Date   INSULIN 21.5 02/09/2019   Lab Results  Component Value Date   WBC 13.3 (H) 10/03/2016   HGB 11.9 (L) 10/03/2016   HCT 35.5 (L) 10/03/2016   MCV 78.2 10/03/2016   PLT 272 10/03/2016   Attestation Statements:   Reviewed by clinician on day of visit: allergies, medications, problem list, medical history, surgical history, family history, social history, and previous encounter notes.  I, Water quality scientist, CMA, am acting as Location manager for [employer], DO.  I have reviewed the above documentation for accuracy and completeness, and I agree with the above. Briscoe Deutscher, DO

## 2019-08-09 NOTE — Progress Notes (Signed)
Office: [phone]  /  Fax: [phone]    Date: August 17, 2019   Appointment Start Time: 4:00pm Duration: 32 minutes Provider: Glennie Isle, Psy.D. Type of Session: Individual Therapy  Location of Patient: Parked in car at home Location of Provider: Provider's Home Type of Contact: Telepsychological Visit via [employer]  Session Content: Jentrie is a 39 y.o. female presenting via [place] for a follow-up appointment to address the previously established treatment goal of increasing coping skills. Today's appointment was a telepsychological visit due to COVID-19. Ashiah provided verbal consent for today's telepsychological appointment and she is aware she is responsible for securing confidentiality on her end of the session. Prior to proceeding with today's appointment, Jaclynn's physical location at the time of this appointment was obtained as well a phone number she could be reached at in the event of technical difficulties. Alana and this provider participated in today's telepsychological service.   This provider conducted a brief check-in and verbally administered the PHQ-9 and GAD-7. Gayann shared about recent events, noting an increase in water intake and physical activity and a reduction in eating out. Positive reinforcement was provided. Almetia also reported an overall reduction in emotional eating, adding, "I think it's because I'm not depressed and I'm not anxious." Reductions in PHQ-9 and GAD-7 scores during the course of treatment were reflected. Verneal noted, "That sounds about right" and she was observed smiling. She reflected on engagement in learned skills. A plan was developed to help Nashayla cope with emotional eating in the future using learned skills. She wrote down the following plan: focus on hydration, be prepared with snacks congruent to the meal plan, pause to ask questions when triggered to eat (e.g., Am I really hungry?; Is there something bothering me? Will I  feel better if I eat?), and engage in coping skills after going through the aforementioned questions. Overall, Kajal was receptive to today's appointment as evidenced by openness to sharing, responsiveness to feedback, and willingness to continue engaging in learned skills.  Mental Status Examination:  Appearance: well groomed and appropriate hygiene  Behavior: appropriate to circumstances Mood: euthymic Affect: mood congruent Speech: normal in rate, volume, and tone Eye Contact: appropriate Psychomotor Activity: appropriate Gait: unable to assess Thought Process: linear, logical, and goal directed  Thought Content/Perception: no hallucinations, delusions, bizarre thinking or behavior reported or observed and no evidence of suicidal and homicidal ideation, plan, and intent Orientation: time, person, place and purpose of appointment Memory/Concentration: memory, attention, language, and fund of knowledge intact  Insight/Judgment: good  Structured Assessments Results: The Patient Health Questionnaire-9 (PHQ-9) is a self-report measure that assesses symptoms and severity of depression over the course of the last two weeks. Minami obtained a score of 0. [0= Not at all; 1= Several days; 2= More than half the days; 3= Nearly every day] Little interest or pleasure in doing things 0  Feeling down, depressed, or hopeless 0  Trouble falling or staying asleep, or sleeping too much 0  Feeling tired or having little energy 0  Poor appetite or overeating 0  Feeling bad about yourself --- or that you are a failure or have let yourself or your family down 0  Trouble concentrating on things, such as reading the newspaper or watching television 0  Moving or speaking so slowly that other people could have noticed? Or the opposite --- being so fidgety or restless that you have been moving around a lot more than usual 0  Thoughts that you would be better off  dead or hurting yourself in some way 0  PHQ-9  Score 0    The Generalized Anxiety Disorder-7 (GAD-7) is a brief self-report measure that assesses symptoms of anxiety over the course of the last two weeks. Freyah obtained a score of 0. [0= Not at all; 1= Several days; 2= Over half the days; 3= Nearly every day] Feeling nervous, anxious, on edge 0  Not being able to stop or control worrying 0  Worrying too much about different things 0  Trouble relaxing 0  Being so restless that it's hard to sit still 0  Becoming easily annoyed or irritable 0  Feeling afraid as if something awful might happen 0  GAD-7 Score 0   Interventions:  Conducted a brief chart review Verbally administered PHQ-9 and GAD-7 for symptom monitoring Provided empathic reflections and validation Provided positive reinforcement Employed supportive psychotherapy interventions to facilitate reduced distress and to improve coping skills with identified stressors Employed motivational interviewing skills to assess patient's willingness/desire to adhere to recommended medical treatments and assignments Reviewed learned skills  DSM-5 Diagnosis: 311 (F32.8) Other Specified Depressive Disorder, Emotional Eating Behaviors  Treatment Goal & Progress: During the initial appointment with this provider, the following treatment goal was established: increase coping skills. Madia demonstrated progress in her goal as evidenced by increased awareness of hunger patterns, increased awareness of triggers for emotional eating and reduction in emotional eating. Addylyn also continues to demonstrate willingness to engage in learned skill(s).  Plan: As previously planned, today was Bernadett's last appointment with this provider. She acknowledged understanding that she may request a follow-up appointment with this provider in the future as long as she is still established with the clinic. No further follow-up planned by this provider.

## 2019-08-17 ENCOUNTER — Other Ambulatory Visit: Payer: Self-pay

## 2019-08-17 ENCOUNTER — Ambulatory Visit (INDEPENDENT_AMBULATORY_CARE_PROVIDER_SITE_OTHER): Payer: BC Managed Care – PPO | Admitting: Psychology

## 2019-08-17 DIAGNOSIS — F3289 Other specified depressive episodes: Secondary | ICD-10-CM

## 2019-08-21 ENCOUNTER — Other Ambulatory Visit: Payer: Self-pay

## 2019-08-21 ENCOUNTER — Ambulatory Visit (INDEPENDENT_AMBULATORY_CARE_PROVIDER_SITE_OTHER): Payer: BC Managed Care – PPO | Admitting: Bariatrics

## 2019-08-21 ENCOUNTER — Encounter (INDEPENDENT_AMBULATORY_CARE_PROVIDER_SITE_OTHER): Payer: Self-pay | Admitting: Bariatrics

## 2019-08-21 VITALS — BP 140/89 | HR 97 | Temp 98.2°F | Ht 63.0 in | Wt 305.0 lb

## 2019-08-21 DIAGNOSIS — Z6841 Body Mass Index (BMI) 40.0 and over, adult: Secondary | ICD-10-CM

## 2019-08-21 DIAGNOSIS — E559 Vitamin D deficiency, unspecified: Secondary | ICD-10-CM | POA: Diagnosis not present

## 2019-08-21 DIAGNOSIS — R7303 Prediabetes: Secondary | ICD-10-CM | POA: Diagnosis not present

## 2019-08-22 ENCOUNTER — Encounter (INDEPENDENT_AMBULATORY_CARE_PROVIDER_SITE_OTHER): Payer: Self-pay | Admitting: Bariatrics

## 2019-08-22 NOTE — Progress Notes (Signed)
Chief Complaint:   OBESITY Vanessa Wilcox is here to discuss her progress with her obesity treatment plan along with follow-up of her obesity related diagnoses. Vanessa Wilcox is on the Category 2 Plan and states she is following her eating plan approximately 75% of the time. Vanessa Wilcox states she is walking 30 minutes 3 times per week.  Today's visit was #: 11 Starting weight: 306 lbs Starting date: 02/09/2019 Today's weight: 305 lbs Today's date: 08/21/2019 Total lbs lost to date: 1 Total lbs lost since last in-office visit: 0  Interim History: Vanessa Wilcox is up 3 lbs. Her last visit was 08/02/2019. She is doing well with her water. She has had some snacks.  Subjective:   Vitamin D deficiency. No nausea, vomiting, or muscle weakness. Last Vitamin D level 7.6 on 02/09/2019.  Prediabetes. Vanessa Wilcox has a diagnosis of prediabetes based on her elevated HgA1c and was informed this puts her at greater risk of developing diabetes. She continues to work on diet and exercise to decrease her risk of diabetes. She denies nausea or hypoglycemia.  Lab Results  Component Value Date   HGBA1C 5.7 (H) 02/09/2019   Lab Results  Component Value Date   INSULIN 21.5 02/09/2019   Assessment/Plan:   Vitamin D deficiency. Low Vitamin D level contributes to fatigue and are associated with obesity, breast, and colon cancer. She agrees to continue to take Vitamin D and will follow-up for routine testing of Vitamin D, at least 2-3 times per year to avoid over-replacement.  Prediabetes. Vanessa Wilcox will continue to work on weight loss, exercise, increasing healthy fats and protein, and decreasing simple carbohydrates to help decrease the risk of diabetes.   Class 3 severe obesity due to excess calories with serious comorbidity and body mass index (BMI) of 50.0 to 59.9 in adult [facility]).  Vanessa Wilcox is currently in the action stage of change. As such, her goal is to continue with weight loss efforts. She has agreed to the Category 2  Plan.   She will work on meal planning, mindful eating, and increasing her protein intake. She was given Snack sheet.  Exercise goals: Vanessa Wilcox will continue walking 30 minutes 3 times per week.  Behavioral modification strategies: increasing lean protein intake, decreasing simple carbohydrates, increasing vegetables, increasing water intake, decreasing eating out, no skipping meals, meal planning and cooking strategies and keeping healthy foods in the home.  Vanessa Wilcox has agreed to follow-up with our clinic in 2 weeks. She was informed of the importance of frequent follow-up visits to maximize her success with intensive lifestyle modifications for her multiple health conditions.   Objective:   Blood pressure 140/89, pulse 97, temperature 98.2 F (36.8 C), height 5\' 3"  (1.6 m), weight (!) 305 lb (138.3 kg), last menstrual period 08/18/2019, SpO2 100 %. Body mass index is 54.03 kg/m.  General: Cooperative, alert, well developed, in no acute distress. HEENT: Conjunctivae and lids unremarkable. Cardiovascular: Regular rhythm.  Lungs: Normal work of breathing. Neurologic: No focal deficits.   Lab Results  Component Value Date   CREATININE 0.66 10/03/2016   BUN 6 10/03/2016   NA 136 10/03/2016   K 3.9 10/03/2016   CL 104 10/03/2016   CO2 22 10/03/2016   Lab Results  Component Value Date   ALT 13 (L) 10/03/2016   AST 17 10/03/2016   ALKPHOS 56 10/03/2016   BILITOT 0.6 10/03/2016   Lab Results  Component Value Date   HGBA1C 5.7 (H) 02/09/2019   Lab Results  Component Value Date  INSULIN 21.5 02/09/2019   No results found for: TSH No results found for: CHOL, HDL, LDLCALC, LDLDIRECT, TRIG, CHOLHDL Lab Results  Component Value Date   WBC 13.3 (H) 10/03/2016   HGB 11.9 (L) 10/03/2016   HCT 35.5 (L) 10/03/2016   MCV 78.2 10/03/2016   PLT 272 10/03/2016   No results found for: IRON, TIBC, FERRITIN  Attestation Statements:   Reviewed by clinician on day of visit: allergies,  medications, problem list, medical history, surgical history, family history, social history, and previous encounter notes.  Time spent on visit including pre-visit chart review and post-visit care was 20 minutes.   Migdalia Dk, am acting as Location manager for [employer], DO   I have reviewed the above documentation for accuracy and completeness, and I agree with the above. Jearld Lesch, DO

## 2019-08-28 ENCOUNTER — Encounter (INDEPENDENT_AMBULATORY_CARE_PROVIDER_SITE_OTHER): Payer: Self-pay

## 2019-09-06 ENCOUNTER — Encounter (INDEPENDENT_AMBULATORY_CARE_PROVIDER_SITE_OTHER): Payer: Self-pay | Admitting: Bariatrics

## 2019-09-06 ENCOUNTER — Ambulatory Visit (INDEPENDENT_AMBULATORY_CARE_PROVIDER_SITE_OTHER): Payer: BC Managed Care – PPO | Admitting: Bariatrics

## 2019-09-06 ENCOUNTER — Other Ambulatory Visit: Payer: Self-pay

## 2019-09-06 VITALS — BP 135/81 | HR 81 | Temp 99.4°F | Ht 63.0 in | Wt 305.0 lb

## 2019-09-06 DIAGNOSIS — R7303 Prediabetes: Secondary | ICD-10-CM | POA: Diagnosis not present

## 2019-09-06 DIAGNOSIS — I10 Essential (primary) hypertension: Secondary | ICD-10-CM

## 2019-09-06 DIAGNOSIS — Z6841 Body Mass Index (BMI) 40.0 and over, adult: Secondary | ICD-10-CM

## 2019-09-07 ENCOUNTER — Encounter (INDEPENDENT_AMBULATORY_CARE_PROVIDER_SITE_OTHER): Payer: Self-pay | Admitting: Bariatrics

## 2019-09-07 NOTE — Progress Notes (Signed)
Chief Complaint:   OBESITY Vanessa Wilcox is here to discuss her progress with her obesity treatment plan along with follow-up of her obesity related diagnoses. Vanessa Wilcox is on the Category 2 Plan and states she is following her eating plan approximately 70% of the time. Vanessa Wilcox states she is exercising 0 minutes 0 times per week.  Today's visit was #: 12 Starting weight: 306 lbs Starting date: 02/09/2019 Today's weight: 305 lbs Today's date: 09/06/2019 Total lbs lost to date: 1 Total lbs lost since last in-office visit: 0  Interim History: Vanessa Wilcox has used 3/10 nutrition for her breakfast with protein powder. She is doing better with her water. She has not been walking outside.  Subjective:   Essential hypertension. Blood pressure is controlled. [name] is on no medications.  BP Readings from Last 3 Encounters:  09/06/19 135/81  08/21/19 140/89  08/02/19 135/89   Lab Results  Component Value Date   CREATININE 0.66 10/03/2016   CREATININE 0.86 08/11/2013   Prediabetes. Vanessa Wilcox has a diagnosis of prediabetes based on her elevated HgA1c and was informed this puts her at greater risk of developing diabetes. She continues to work on diet and exercise to decrease her risk of diabetes. She denies nausea or hypoglycemia. No polyphagia.  Lab Results  Component Value Date   HGBA1C 5.7 (H) 02/09/2019   Lab Results  Component Value Date   INSULIN 21.5 02/09/2019   Assessment/Plan:   Essential hypertension. Patte is working on healthy weight loss and exercise to improve blood pressure control. We will watch for signs of hypotension as she continues her lifestyle modifications. She will decrease her sodium intake and watch her blood pressure.  Prediabetes. Vanessa Wilcox will continue to work on weight loss, exercise, increasing protein and healthy fats, and decreasing simple carbohydrates to help decrease the risk of diabetes.   Class 3 severe obesity with serious comorbidity and body mass  index (BMI) of 50.0 to 59.9 in adult, unspecified obesity type ([place]).  Vanessa Wilcox is currently in the action stage of change. As such, her goal is to continue with weight loss efforts. She has agreed to following a lower carbohydrate, vegetable and lean protein rich diet plan.   She will work on meal planning, mindful eating, and will use her Fitbit.   Exercise goals: All adults should avoid inactivity. Some physical activity is better than none, and adults who participate in any amount of physical activity gain some health benefits.  Behavioral modification strategies: increasing lean protein intake, decreasing simple carbohydrates, increasing vegetables, increasing water intake, decreasing eating out, no skipping meals, meal planning and cooking strategies, keeping healthy foods in the home and planning for success.  Vanessa Wilcox has agreed to follow-up with our clinic in 2 weeks. She was informed of the importance of frequent follow-up visits to maximize her success with intensive lifestyle modifications for her multiple health conditions.   Objective:   Blood pressure 135/81, pulse 81, temperature 99.4 F (37.4 C), height 5\' 3"  (1.6 m), weight (!) 305 lb (138.3 kg), last menstrual period 08/18/2019, SpO2 98 %. Body mass index is 54.03 kg/m.  General: Cooperative, alert, well developed, in no acute distress. HEENT: Conjunctivae and lids unremarkable. Cardiovascular: Regular rhythm.  Lungs: Normal work of breathing. Neurologic: No focal deficits.   Lab Results  Component Value Date   CREATININE 0.66 10/03/2016   BUN 6 10/03/2016   NA 136 10/03/2016   K 3.9 10/03/2016   CL 104 10/03/2016   CO2 22 10/03/2016  Lab Results  Component Value Date   ALT 13 (L) 10/03/2016   AST 17 10/03/2016   ALKPHOS 56 10/03/2016   BILITOT 0.6 10/03/2016   Lab Results  Component Value Date   HGBA1C 5.7 (H) 02/09/2019   Lab Results  Component Value Date   INSULIN 21.5 02/09/2019   No results found  for: TSH No results found for: CHOL, HDL, LDLCALC, LDLDIRECT, TRIG, CHOLHDL Lab Results  Component Value Date   WBC 13.3 (H) 10/03/2016   HGB 11.9 (L) 10/03/2016   HCT 35.5 (L) 10/03/2016   MCV 78.2 10/03/2016   PLT 272 10/03/2016   No results found for: IRON, TIBC, FERRITIN  Attestation Statements:   Reviewed by clinician on day of visit: allergies, medications, problem list, medical history, surgical history, family history, social history, and previous encounter notes.  Time spent on visit including pre-visit chart review and post-visit charting and care was 20 minutes.   Migdalia Dk, am acting as Location manager for [employer], DO   I have reviewed the above documentation for accuracy and completeness, and I agree with the above. Jearld Lesch, DO

## 2019-09-13 ENCOUNTER — Ambulatory Visit (INDEPENDENT_AMBULATORY_CARE_PROVIDER_SITE_OTHER): Payer: BC Managed Care – PPO | Admitting: Bariatrics

## 2019-09-25 ENCOUNTER — Ambulatory Visit (INDEPENDENT_AMBULATORY_CARE_PROVIDER_SITE_OTHER): Payer: BC Managed Care – PPO | Admitting: Family Medicine

## 2019-09-25 ENCOUNTER — Other Ambulatory Visit: Payer: Self-pay

## 2019-09-25 ENCOUNTER — Other Ambulatory Visit (INDEPENDENT_AMBULATORY_CARE_PROVIDER_SITE_OTHER): Payer: Self-pay | Admitting: Family Medicine

## 2019-09-25 ENCOUNTER — Encounter (INDEPENDENT_AMBULATORY_CARE_PROVIDER_SITE_OTHER): Payer: Self-pay | Admitting: Family Medicine

## 2019-09-25 VITALS — BP 139/91 | HR 97 | Temp 98.7°F | Ht 63.0 in | Wt 303.0 lb

## 2019-09-25 DIAGNOSIS — Z9189 Other specified personal risk factors, not elsewhere classified: Secondary | ICD-10-CM

## 2019-09-25 DIAGNOSIS — E559 Vitamin D deficiency, unspecified: Secondary | ICD-10-CM

## 2019-09-25 DIAGNOSIS — I10 Essential (primary) hypertension: Secondary | ICD-10-CM | POA: Diagnosis not present

## 2019-09-25 DIAGNOSIS — R7303 Prediabetes: Secondary | ICD-10-CM | POA: Diagnosis not present

## 2019-09-25 DIAGNOSIS — Z6841 Body Mass Index (BMI) 40.0 and over, adult: Secondary | ICD-10-CM

## 2019-09-26 MED ORDER — VITAMIN D (ERGOCALCIFEROL) 1.25 MG (50000 UNIT) PO CAPS: 50000.0000 [IU] | ORAL_CAPSULE | ORAL | 0 refills | Status: DC

## 2019-09-26 NOTE — Progress Notes (Signed)
Chief Complaint:   OBESITY Vanessa Wilcox is here to discuss her progress with her obesity treatment plan along with follow-up of her obesity related diagnoses. Vanessa Wilcox is on following a lower carbohydrate, vegetable and lean protein rich diet plan and states she is following her eating plan approximately 25% of the time. Vanessa Wilcox states she is walking/doing steps for 20 minutes 3 times per week.  Today's visit was #: 13 Starting weight: 306 lbs Starting date: 02/12/2019 Today's weight: 303 lbs Today's date: 09/25/2019 Total lbs lost to date: 3 lbs Total lbs lost since last in-office visit: 2 lbs  Interim History: Vanessa Wilcox has been drinking 64 ounces of water a day but wants to increase to 80 ounces.  She has a shake for breakfast and is adding superfoods to it.  She is prepping meals.  Her household is cutting carbohydrates at dinner.  Subjective:   1. Prediabetes Vanessa Wilcox has a diagnosis of prediabetes based on her elevated HgA1c and was informed this puts her at greater risk of developing diabetes. She continues to work on diet and exercise to decrease her risk of diabetes. She denies nausea or hypoglycemia.  Lab Results  Component Value Date   HGBA1C 5.7 (H) 02/09/2019   Lab Results  Component Value Date   INSULIN 21.5 02/09/2019   2. Essential hypertension Review: taking medications as instructed, no medication side effects noted, no chest pain on exertion, no dyspnea on exertion, no swelling of ankles.   BP Readings from Last 3 Encounters:  09/25/19 (!) 139/91  09/06/19 135/81  08/21/19 140/89   3. Vitamin D deficiency Vanessa Wilcox's Vitamin D level was 7.6 on 02/09/2019. She is currently taking vit D. She denies nausea, vomiting or muscle weakness.  Assessment/Plan:   1. Prediabetes Vanessa Wilcox will continue to work on weight loss, exercise, and decreasing simple carbohydrates to help decrease the risk of diabetes.   2. Essential hypertension Vanessa Wilcox is working on healthy weight loss and  exercise to improve blood pressure control. We will watch for signs of hypotension as she continues her lifestyle modifications.  3. Vitamin D deficiency Low Vitamin D level contributes to fatigue and are associated with obesity, breast, and colon cancer. She agrees to continue to take prescription Vitamin D @50 ,000 IU every week and will follow-up for routine testing of Vitamin D, at least 2-3 times per year to avoid over-replacement.  Orders - Vitamin D, Ergocalciferol, (DRISDOL) 1.25 MG (50000 UNIT) CAPS capsule; Take 1 capsule (50,000 Units total) by mouth every 7 (seven) days.  Dispense: 4 capsule; Refill: 0  4. At risk for constipation Vanessa Wilcox was given approximately 15 minutes of counseling today regarding prevention of constipation. She was encouraged to increase water and fiber intake.   5. Class 3 severe obesity with serious comorbidity and body mass index (BMI) of 50.0 to 59.9 in adult, unspecified obesity type [facility]) Vanessa Wilcox is currently in the action stage of change. As such, her goal is to continue with weight loss efforts. She has agreed to keeping a food journal and adhering to recommended goals of 1200-1300 calories and 95+ grams of protein.   Exercise goals: 10,000 steps a day  Behavioral modification strategies: increasing lean protein intake and increasing water intake.  Vanessa Wilcox has agreed to follow-up with our clinic in 2 weeks. She was informed of the importance of frequent follow-up visits to maximize her success with intensive lifestyle modifications for her multiple health conditions.   Objective:   Blood pressure (!) 139/91, pulse 97, temperature  98.7 F (37.1 C), temperature source Oral, height 5\' 3"  (1.6 m), weight (!) 303 lb (137.4 kg), last menstrual period 09/14/2019, SpO2 99 %. Body mass index is 53.67 kg/m.  General: Cooperative, alert, well developed, in no acute distress. HEENT: Conjunctivae and lids unremarkable. Cardiovascular: Regular rhythm.  Lungs:  Normal work of breathing. Neurologic: No focal deficits.   Lab Results  Component Value Date   CREATININE 0.66 10/03/2016   BUN 6 10/03/2016   NA 136 10/03/2016   K 3.9 10/03/2016   CL 104 10/03/2016   CO2 22 10/03/2016   Lab Results  Component Value Date   ALT 13 (L) 10/03/2016   AST 17 10/03/2016   ALKPHOS 56 10/03/2016   BILITOT 0.6 10/03/2016   Lab Results  Component Value Date   HGBA1C 5.7 (H) 02/09/2019   Lab Results  Component Value Date   INSULIN 21.5 02/09/2019   Lab Results  Component Value Date   WBC 13.3 (H) 10/03/2016   HGB 11.9 (L) 10/03/2016   HCT 35.5 (L) 10/03/2016   MCV 78.2 10/03/2016   PLT 272 10/03/2016   Attestation Statements:   Reviewed by clinician on day of visit: allergies, medications, problem list, medical history, surgical history, family history, social history, and previous encounter notes.  I, Water quality scientist, CMA, am acting as Location manager for [employer], DO.  I have reviewed the above documentation for accuracy and completeness, and I agree with the above. Briscoe Deutscher, DO

## 2019-10-16 ENCOUNTER — Other Ambulatory Visit: Payer: Self-pay

## 2019-10-16 ENCOUNTER — Encounter (INDEPENDENT_AMBULATORY_CARE_PROVIDER_SITE_OTHER): Payer: Self-pay | Admitting: Family Medicine

## 2019-10-16 ENCOUNTER — Ambulatory Visit (INDEPENDENT_AMBULATORY_CARE_PROVIDER_SITE_OTHER): Payer: BC Managed Care – PPO | Admitting: Family Medicine

## 2019-10-16 VITALS — BP 130/83 | HR 80 | Temp 98.0°F | Ht 63.0 in | Wt 301.0 lb

## 2019-10-16 DIAGNOSIS — E559 Vitamin D deficiency, unspecified: Secondary | ICD-10-CM

## 2019-10-16 DIAGNOSIS — I10 Essential (primary) hypertension: Secondary | ICD-10-CM

## 2019-10-16 DIAGNOSIS — R7303 Prediabetes: Secondary | ICD-10-CM | POA: Diagnosis not present

## 2019-10-16 DIAGNOSIS — Z6841 Body Mass Index (BMI) 40.0 and over, adult: Secondary | ICD-10-CM

## 2019-10-16 NOTE — Progress Notes (Signed)
Chief Complaint:   OBESITY Vanessa Wilcox is here to discuss her progress with her obesity treatment plan along with follow-up of her obesity related diagnoses. Vanessa Wilcox is on the Category 2 Plan and states she is following her eating plan approximately 50% of the time. Vanessa Wilcox states she is walking for 30 minutes 3-5 times per week.  Today's visit was #: 14 Starting weight: 306 lbs Starting date: 02/12/2019 Today's weight: 301 lbs Today's date: 10/16/2019 Total lbs lost to date: 5 lbs Total lbs lost since last in-office visit: 2 lbs  Interim History: Vanessa Wilcox's sister died last week, from ovarian cancer. She says that she is drinking 80+ ounces of water per day and has decreased the amount of carbs she is eating at night.  She is getting better with walking.  Subjective:   1. Vitamin D deficiency Vanessa Wilcox's Vitamin D level was 7.6 on 02/09/2019. She is currently taking prescription vitamin D 50,000 IU each week. She denies nausea, vomiting or muscle weakness.  2. Essential hypertension Review: taking medications as instructed, no medication side effects noted, no chest pain on exertion, no dyspnea on exertion, no swelling of ankles.    BP Readings from Last 3 Encounters:  10/16/19 130/83  09/25/19 (!) 139/91  09/06/19 135/81   3. Prediabetes Vanessa Wilcox has a diagnosis of prediabetes based on her elevated HgA1c and was informed this puts her at greater risk of developing diabetes. She continues to work on diet and exercise to decrease her risk of diabetes. She denies nausea or hypoglycemia.  Lab Results  Component Value Date   HGBA1C 5.7 (H) 02/09/2019   Lab Results  Component Value Date   INSULIN 21.5 02/09/2019   Assessment/Plan:   1. Vitamin D deficiency Low Vitamin D level contributes to fatigue and are associated with obesity, breast, and colon cancer. She agrees to continue to take prescription Vitamin D @50 ,000 IU every week and will follow-up for routine testing of Vitamin D, at  least 2-3 times per year to avoid over-replacement.  2. Essential hypertension Vanessa Wilcox is working on healthy weight loss and exercise to improve blood pressure control. We will watch for signs of hypotension as she continues her lifestyle modifications.  3. Prediabetes Vanessa Wilcox will continue to work on weight loss, exercise, and decreasing simple carbohydrates to help decrease the risk of diabetes.   4. Class 3 severe obesity with serious comorbidity and body mass index (BMI) of 50.0 to 59.9 in adult, unspecified obesity type [facility]) Vanessa Wilcox is currently in the action stage of change. As such, her goal is to continue with weight loss efforts. She has agreed to the Category 2 Plan.   Exercise goals: For substantial health benefits, adults should do at least 150 minutes (2 hours and 30 minutes) a week of moderate-intensity, or 75 minutes (1 hour and 15 minutes) a week of vigorous-intensity aerobic physical activity, or an equivalent combination of moderate- and vigorous-intensity aerobic activity. Aerobic activity should be performed in episodes of at least 10 minutes, and preferably, it should be spread throughout the week.  Behavioral modification strategies: increasing lean protein intake.  Vanessa Wilcox has agreed to follow-up with our clinic in 3 weeks. She was informed of the importance of frequent follow-up visits to maximize her success with intensive lifestyle modifications for her multiple health conditions.   Objective:   Blood pressure 130/83, pulse 80, temperature 98 F (36.7 C), temperature source Oral, height 5\' 3"  (1.6 m), weight (!) 301 lb (136.5 kg), last menstrual period 10/13/2019,  SpO2 100 %. Body mass index is 53.32 kg/m.  General: Cooperative, alert, well developed, in no acute distress. HEENT: Conjunctivae and lids unremarkable. Cardiovascular: Regular rhythm.  Lungs: Normal work of breathing. Neurologic: No focal deficits.   Lab Results  Component Value Date   CREATININE  0.66 10/03/2016   BUN 6 10/03/2016   NA 136 10/03/2016   K 3.9 10/03/2016   CL 104 10/03/2016   CO2 22 10/03/2016   Lab Results  Component Value Date   ALT 13 (L) 10/03/2016   AST 17 10/03/2016   ALKPHOS 56 10/03/2016   BILITOT 0.6 10/03/2016   Lab Results  Component Value Date   HGBA1C 5.7 (H) 02/09/2019   Lab Results  Component Value Date   INSULIN 21.5 02/09/2019   Lab Results  Component Value Date   WBC 13.3 (H) 10/03/2016   HGB 11.9 (L) 10/03/2016   HCT 35.5 (L) 10/03/2016   MCV 78.2 10/03/2016   PLT 272 10/03/2016   Attestation Statements:   Reviewed by clinician on day of visit: allergies, medications, problem list, medical history, surgical history, family history, social history, and previous encounter notes.  I, Water quality scientist, CMA, am acting as Location manager for [employer], DO.  I have reviewed the above documentation for accuracy and completeness, and I agree with the above. Briscoe Deutscher, DO

## 2019-11-08 ENCOUNTER — Ambulatory Visit (INDEPENDENT_AMBULATORY_CARE_PROVIDER_SITE_OTHER): Payer: BC Managed Care – PPO | Admitting: Family Medicine

## 2019-11-13 ENCOUNTER — Other Ambulatory Visit: Payer: Self-pay

## 2019-11-13 ENCOUNTER — Encounter (INDEPENDENT_AMBULATORY_CARE_PROVIDER_SITE_OTHER): Payer: Self-pay | Admitting: Family Medicine

## 2019-11-13 ENCOUNTER — Ambulatory Visit (INDEPENDENT_AMBULATORY_CARE_PROVIDER_SITE_OTHER): Payer: BC Managed Care – PPO | Admitting: Family Medicine

## 2019-11-13 VITALS — BP 132/78 | HR 97 | Temp 98.7°F | Ht 63.0 in | Wt 302.0 lb

## 2019-11-13 DIAGNOSIS — Z79899 Other long term (current) drug therapy: Secondary | ICD-10-CM | POA: Diagnosis not present

## 2019-11-13 DIAGNOSIS — R632 Polyphagia: Secondary | ICD-10-CM

## 2019-11-13 DIAGNOSIS — Z9189 Other specified personal risk factors, not elsewhere classified: Secondary | ICD-10-CM | POA: Diagnosis not present

## 2019-11-13 DIAGNOSIS — Z6841 Body Mass Index (BMI) 40.0 and over, adult: Secondary | ICD-10-CM

## 2019-11-14 MED ORDER — PHENTERMINE HCL 15 MG PO CAPS: 15.0000 mg | ORAL_CAPSULE | ORAL | 0 refills | Status: DC

## 2019-11-14 MED ORDER — CRYSELLE-28 0.3-30 MG-MCG PO TABS: 1.0000 | ORAL_TABLET | Freq: Every day | ORAL | 0 refills | Status: DC

## 2019-11-14 NOTE — Progress Notes (Signed)
Chief Complaint:   OBESITY Vanessa Wilcox is here to discuss her progress with her obesity treatment plan along with follow-up of her obesity related diagnoses. Vanessa Wilcox is on the Category 2 Plan and states she is following her eating plan approximately 50% of the time. Vanessa Wilcox states she is walking 1+ mile 5 times per week.  Today's visit was #: 15 Starting weight: 306 lbs Starting date: 02/12/2019 Today's weight: 302 lbs Today's date: 11/13/2019 Total lbs lost to date: 4 lbs Total lbs lost since last in-office visit: 0  Interim History: Vanessa Wilcox started meal prepping lunch (tuna with mayo, relish, 2 eggs, and 8 wheat thins).  She says she has been drinking 5 bottles of water per day.  She has been walking 0.8 mile during recess (20 minutes).  She endorses polyphagia.   Subjective:   1. Medication management Vanessa Wilcox will soon run out of OCPs.    2. Vanessa Wilcox endorses excessive hunger.   3. At risk for constipation Vanessa Wilcox is at increased risk for constipation due to inadequate water intake, changes in diet, and/or use of medications such as GLP1 agonists. Vanessa Wilcox denies hard, infrequent stools currently.   Assessment/Plan:   1. Medication management Will refill OCPs today.  Reviewed importance of protecting from pregnancy while taking phentermine.  Orders - norgestrel-ethinyl estradiol (CRYSELLE-28) 0.3-30 MG-MCG tablet; Take 1 tablet by mouth daily.  Dispense: 84 tablet; Refill: 0  2. Polyphagia Intensive lifestyle modifications are the first line treatment for this issue. We discussed several lifestyle modifications today and she will continue to work on diet, exercise and weight loss efforts. Orders and follow up as documented in patient record.  Counseling . Polyphagia is excessive hunger. . Causes can include: low blood sugars, hypERthyroidism, PMS, lack of sleep, stress, insulin resistance, diabetes, certain medications, and diets that are deficient in protein and fiber.    Orders - phentermine 15 MG capsule; Take 1 capsule (15 mg total) by mouth every morning.  Dispense: 30 capsule; Refill: 0  3. At risk for constipation Vanessa Wilcox was given approximately 15 minutes of counseling today regarding prevention of constipation. She was encouraged to increase water and fiber intake.   4. Class 3 severe obesity with serious comorbidity and body mass index (BMI) of 50.0 to 59.9 in adult, unspecified obesity type Vanessa Wilcox) Vanessa Wilcox is currently in the action stage of change. As such, her goal is to continue with weight loss efforts. She has agreed to the Category 2 Plan.   Exercise goals: For substantial health benefits, adults should do at least 150 minutes (2 hours and 30 minutes) a week of moderate-intensity, or 75 minutes (1 hour and 15 minutes) a week of vigorous-intensity aerobic physical activity, or an equivalent combination of moderate- and vigorous-intensity aerobic activity. Aerobic activity should be performed in episodes of at least 10 minutes, and preferably, it should be spread throughout the week.  Behavioral modification strategies: increasing lean protein intake and increasing water intake.  Vanessa Wilcox has agreed to follow-up with our clinic in 2 weeks. She was informed of the importance of frequent follow-up visits to maximize her success with intensive lifestyle modifications for her multiple health conditions.   Objective:   Blood pressure 132/78, pulse 97, temperature 98.7 F (37.1 C), temperature source Oral, height 5\' 3"  (1.6 m), weight (!) 302 lb (137 kg), last menstrual period 11/08/2019, SpO2 100 %. Body mass index is 53.5 kg/m.  General: Cooperative, alert, well developed, in no acute distress. HEENT: Conjunctivae and lids unremarkable. Cardiovascular:  Regular rhythm.  Lungs: Normal work of breathing. Neurologic: No focal deficits.   Lab Results  Component Value Date   CREATININE 0.66 10/03/2016   BUN 6 10/03/2016   NA 136 10/03/2016   K 3.9  10/03/2016   CL 104 10/03/2016   CO2 22 10/03/2016   Lab Results  Component Value Date   ALT 13 (L) 10/03/2016   AST 17 10/03/2016   ALKPHOS 56 10/03/2016   BILITOT 0.6 10/03/2016   Lab Results  Component Value Date   HGBA1C 5.7 (H) 02/09/2019   Lab Results  Component Value Date   INSULIN 21.5 02/09/2019   Lab Results  Component Value Date   WBC 13.3 (H) 10/03/2016   HGB 11.9 (L) 10/03/2016   HCT 35.5 (L) 10/03/2016   MCV 78.2 10/03/2016   PLT 272 10/03/2016   Attestation Statements:   Reviewed by clinician on day of visit: allergies, medications, problem list, medical history, surgical history, family history, social history, and previous encounter notes.  I, Water quality scientist, CMA, am acting as Location manager for [employer], DO.  I have reviewed the above documentation for accuracy and completeness, and I agree with the above. Briscoe Deutscher, DO

## 2019-11-15 ENCOUNTER — Encounter (INDEPENDENT_AMBULATORY_CARE_PROVIDER_SITE_OTHER): Payer: Self-pay | Admitting: Family Medicine

## 2019-11-16 NOTE — Telephone Encounter (Signed)
Please advise thanks.

## 2019-11-16 NOTE — Telephone Encounter (Signed)
Pt stated she has the HCTZ 25 mg that she takes only PRN

## 2019-12-11 ENCOUNTER — Ambulatory Visit (INDEPENDENT_AMBULATORY_CARE_PROVIDER_SITE_OTHER): Payer: BC Managed Care – PPO | Admitting: Family Medicine

## 2019-12-11 ENCOUNTER — Encounter (INDEPENDENT_AMBULATORY_CARE_PROVIDER_SITE_OTHER): Payer: Self-pay | Admitting: Family Medicine

## 2019-12-11 ENCOUNTER — Other Ambulatory Visit: Payer: Self-pay

## 2019-12-11 VITALS — BP 123/84 | HR 87 | Temp 98.8°F | Ht 63.0 in | Wt 298.0 lb

## 2019-12-11 DIAGNOSIS — R632 Polyphagia: Secondary | ICD-10-CM

## 2019-12-11 DIAGNOSIS — Z6841 Body Mass Index (BMI) 40.0 and over, adult: Secondary | ICD-10-CM

## 2019-12-11 DIAGNOSIS — Z9189 Other specified personal risk factors, not elsewhere classified: Secondary | ICD-10-CM | POA: Diagnosis not present

## 2019-12-11 DIAGNOSIS — I10 Essential (primary) hypertension: Secondary | ICD-10-CM | POA: Diagnosis not present

## 2019-12-12 NOTE — Progress Notes (Signed)
Chief Complaint:   OBESITY Vanessa Wilcox is here to discuss her progress with her obesity treatment plan along with follow-up of her obesity related diagnoses. [place] is on the Category 2 Plan and states she is following her eating plan approximately 0% of the time. [name] states she is walking for 2 miles.  Today's visit was #: 16 Starting weight: 306 lbs Starting date: 02/12/2019 Today's weight: 298 lbs Today's date: 12/11/2019 Total lbs lost to date: 8 lbs Total lbs lost since last in-office visit: 4 lbs  Interim History: Vanessa Wilcox is happy that school is out.  She says she is ready to focus on the plan.  Subjective:   1. Polyphagia Vanessa Wilcox endorses excessive hunger.    2. Essential hypertension Review: taking medications as instructed, no medication side effects noted, no chest pain on exertion, no dyspnea on exertion, no swelling of ankles.  Blood pressure is usually controlled with mediction.  BP Readings from Last 3 Encounters:  12/11/19 123/84  11/13/19 132/78  10/16/19 130/83   3. At risk for hypertension The patient is at a higher than average risk of hypertension due to taking phentermine.  Assessment/Plan:   1. Polyphagia Intensive lifestyle modifications are the first line treatment for this issue. We discussed several lifestyle modifications today and she will continue to work on diet, exercise and weight loss efforts. Orders and follow up as documented in patient record.  [place] will start low dose phentermine that she has at home.  Counseling . Polyphagia is excessive hunger. . Causes can include: low blood sugars, hypERthyroidism, PMS, lack of sleep, stress, insulin resistance, diabetes, certain medications, and diets that are deficient in protein and fiber.   2. Essential hypertension Vanessa Wilcox is working on healthy weight loss and exercise to improve blood pressure control. We will watch for signs of hypotension as she continues her lifestyle modifications.  Vanessa Wilcox  will restart HCTZ 25 mg daily.  If her blood pressure increases, she will stop phentermine.  3. At risk for hypertension Vanessa Wilcox was given approximately 15 minutes of hypertension prevention counseling today. Vanessa Wilcox is at risk for hypertension due to obesity. We discussed intensive lifestyle modifications today with an emphasis on weight loss as well as increasing exercise and decreasing salt intake.  Repetitive spaced learning was employed today to elicit superior memory formation and behavioral change.  4. Class 3 severe obesity with serious comorbidity and body mass index (BMI) of 50.0 to 59.9 in adult, unspecified obesity type [facility]) Vanessa Wilcox is currently in the action stage of change. As such, her goal is to continue with weight loss efforts. She has agreed to the Category 2 Plan.   Exercise goals: For substantial health benefits, adults should do at least 150 minutes (2 hours and 30 minutes) a week of moderate-intensity, or 75 minutes (1 hour and 15 minutes) a week of vigorous-intensity aerobic physical activity, or an equivalent combination of moderate- and vigorous-intensity aerobic activity. Aerobic activity should be performed in episodes of at least 10 minutes, and preferably, it should be spread throughout the week.  Behavioral modification strategies: increasing lean protein intake, increasing water intake and increasing high fiber foods.  Vanessa Wilcox has agreed to follow-up with our clinic in 3 weeks. She was informed of the importance of frequent follow-up visits to maximize her success with intensive lifestyle modifications for her multiple health conditions.   Objective:   Blood pressure 123/84, pulse 87, temperature 98.8 F (37.1 C), temperature source Oral, height 5\' 3"  (1.6 m), weight  298 lb (135.2 kg), SpO2 99 %. Body mass index is 52.79 kg/m.  General: Cooperative, alert, well developed, in no acute distress. HEENT: Conjunctivae and lids unremarkable. Cardiovascular:  Regular rhythm.  Lungs: Normal work of breathing. Neurologic: No focal deficits.   Lab Results  Component Value Date   CREATININE 0.66 10/03/2016   BUN 6 10/03/2016   NA 136 10/03/2016   K 3.9 10/03/2016   CL 104 10/03/2016   CO2 22 10/03/2016   Lab Results  Component Value Date   ALT 13 (L) 10/03/2016   AST 17 10/03/2016   ALKPHOS 56 10/03/2016   BILITOT 0.6 10/03/2016   Lab Results  Component Value Date   HGBA1C 5.7 (H) 02/09/2019   Lab Results  Component Value Date   INSULIN 21.5 02/09/2019   Lab Results  Component Value Date   WBC 13.3 (H) 10/03/2016   HGB 11.9 (L) 10/03/2016   HCT 35.5 (L) 10/03/2016   MCV 78.2 10/03/2016   PLT 272 10/03/2016   Attestation Statements:   Reviewed by clinician on day of visit: allergies, medications, problem list, medical history, surgical history, family history, social history, and previous encounter notes.  I, Water quality scientist, CMA, am acting as transcriptionist for Briscoe Deutscher, DO  I have reviewed the above documentation for accuracy and completeness, and I agree with the above. Briscoe Deutscher, DO

## 2020-01-01 ENCOUNTER — Ambulatory Visit (INDEPENDENT_AMBULATORY_CARE_PROVIDER_SITE_OTHER): Payer: Self-pay | Admitting: Family Medicine

## 2020-01-04 ENCOUNTER — Other Ambulatory Visit: Payer: Self-pay

## 2020-01-04 ENCOUNTER — Encounter (INDEPENDENT_AMBULATORY_CARE_PROVIDER_SITE_OTHER): Payer: Self-pay | Admitting: Family Medicine

## 2020-01-04 ENCOUNTER — Ambulatory Visit (INDEPENDENT_AMBULATORY_CARE_PROVIDER_SITE_OTHER): Payer: BC Managed Care – PPO | Admitting: Family Medicine

## 2020-01-04 VITALS — BP 120/84 | HR 94 | Temp 98.6°F | Ht 63.0 in | Wt 299.0 lb

## 2020-01-04 DIAGNOSIS — R632 Polyphagia: Secondary | ICD-10-CM

## 2020-01-04 DIAGNOSIS — I10 Essential (primary) hypertension: Secondary | ICD-10-CM

## 2020-01-04 DIAGNOSIS — Z9189 Other specified personal risk factors, not elsewhere classified: Secondary | ICD-10-CM | POA: Diagnosis not present

## 2020-01-04 DIAGNOSIS — E559 Vitamin D deficiency, unspecified: Secondary | ICD-10-CM | POA: Diagnosis not present

## 2020-01-04 DIAGNOSIS — Z6841 Body Mass Index (BMI) 40.0 and over, adult: Secondary | ICD-10-CM

## 2020-01-04 MED ORDER — SEMAGLUTIDE(0.25 OR 0.5MG/DOS) 2 MG/1.5ML ~~LOC~~ SOPN: 0.2500 mg | PEN_INJECTOR | SUBCUTANEOUS | 0 refills | Status: AC

## 2020-01-04 MED ORDER — VITAMIN D (ERGOCALCIFEROL) 1.25 MG (50000 UNIT) PO CAPS: 50000.0000 [IU] | ORAL_CAPSULE | ORAL | 0 refills | Status: DC

## 2020-01-04 MED ORDER — ONDANSETRON 4 MG PO TBDP: 4.0000 mg | ORAL_TABLET | Freq: Four times a day (QID) | ORAL | 0 refills | Status: DC | PRN

## 2020-01-04 NOTE — Progress Notes (Signed)
Chief Complaint:   OBESITY Vanessa Wilcox is here to discuss her progress with her obesity treatment plan along with follow-up of her obesity related diagnoses. Vanessa Wilcox is on the Category 2 Plan and states she is following her eating plan approximately 30% of the time. Vanessa Wilcox states she is exercising for 0 minutes 0 times per week.  Today's visit was #: 40 Starting weight: 306 lbs Starting date: 02/12/2019 Today's weight: 299 lbs Today's date: 01/04/2020 Total lbs lost to date: 7 lbs Total lbs lost since last in-office visit: 0  Interim History: Vanessa Wilcox says she cannot remember to take the phentermine.  She says she has recently been in a rut and had a decreased appetite.  She says she has been listening to her body.  Today's bioimpedance results indicate that Vanessa Wilcox has gained 4.8 pounds of water weight since her last visit.  Subjective:   1. Vitamin D deficiency Vanessa Wilcox's Vitamin D level was 7.6 on 02/09/2019. She is currently taking prescription vitamin D 50,000 IU each week. She denies nausea, vomiting or muscle weakness.  2. Vanessa Wilcox endorses excessive hunger.   3. Essential hypertension Review: taking medications as instructed, no medication side effects noted, no chest pain on exertion, no dyspnea on exertion, no swelling of ankles.  Blood pressure is at goal today.  BP Readings from Last 3 Encounters:  01/04/20 120/84  12/11/19 123/84  11/13/19 132/78   Assessment/Plan:   1. Vitamin D deficiency Low Vitamin D level contributes to fatigue and are associated with obesity, breast, and colon cancer. She agrees to continue to take prescription Vitamin D @50 ,000 IU every week and will follow-up for routine testing of Vitamin D, at least 2-3 times per year to avoid over-replacement.  Orders - Vitamin D, Ergocalciferol, (DRISDOL) 1.25 MG (50000 UNIT) CAPS capsule; Take 1 capsule (50,000 Units total) by mouth every 7 (seven) days.  Dispense: 4 capsule; Refill: 0  2.  Polyphagia Intensive lifestyle modifications are the first line treatment for this issue. We discussed several lifestyle modifications today and she will continue to work on diet, exercise and weight loss efforts. Orders and follow up as documented in patient record.  Stop phentermine.  Counseling . Polyphagia is excessive hunger. . Causes can include: low blood sugars, hypERthyroidism, PMS, lack of sleep, stress, insulin resistance, diabetes, certain medications, and diets that are deficient in protein and fiber.   3. Essential hypertension Vanessa Wilcox is working on healthy weight loss and exercise to improve blood pressure control. We will watch for signs of hypotension as she continues her lifestyle modifications.  4. At risk for heart disease Vanessa Wilcox was given approximately 15 minutes of coronary artery disease prevention counseling today. She is 39 y.o. female and has risk factors for heart disease including obesity. We discussed intensive lifestyle modifications today with an emphasis on specific weight loss instructions and strategies.   Repetitive spaced learning was employed today to elicit superior memory formation and behavioral change.  5. Class 3 severe obesity with serious comorbidity and body mass index (BMI) of 50.0 to 59.9 in adult, unspecified obesity type (Vanessa Wilcox)  Orders - Semaglutide,0.25 or 0.5MG /DOS, 2 MG/1.5ML SOPN; Inject 0.1875 mLs (0.25 mg total) into the skin once a week for 4 doses.  Dispense: 4 pen; Refill: 0 - ondansetron (ZOFRAN ODT) 4 MG disintegrating tablet; Take 1 tablet (4 mg total) by mouth every 6 (six) hours as needed for nausea.  Dispense: 12 tablet; Refill: 0  Vanessa Wilcox is currently in the action stage of  change. As such, her goal is to continue with weight loss efforts. She has agreed to the Category 2 Plan.   Exercise goals: For substantial health benefits, adults should do at least 150 minutes (2 hours and 30 minutes) a week of moderate-intensity, or 75 minutes  (1 hour and 15 minutes) a week of vigorous-intensity aerobic physical activity, or an equivalent combination of moderate- and vigorous-intensity aerobic activity. Aerobic activity should be performed in episodes of at least 10 minutes, and preferably, it should be spread throughout the week.  Behavioral modification strategies: increasing lean protein intake.  Vanessa Wilcox has agreed to follow-up with our clinic in 3 weeks. She was informed of the importance of frequent follow-up visits to maximize her success with intensive lifestyle modifications for her multiple health conditions.   Objective:   Blood pressure 120/84, pulse 94, temperature 98.6 F (37 C), temperature source Oral, height 5\' 3"  (1.6 m), weight 299 lb (135.6 kg), SpO2 97 %. Body mass index is 52.97 kg/m.  General: Cooperative, alert, well developed, in no acute distress. HEENT: Conjunctivae and lids unremarkable. Cardiovascular: Regular rhythm.  Lungs: Normal work of breathing. Neurologic: No focal deficits.   Lab Results  Component Value Date   CREATININE 0.66 10/03/2016   BUN 6 10/03/2016   NA 136 10/03/2016   K 3.9 10/03/2016   CL 104 10/03/2016   CO2 22 10/03/2016   Lab Results  Component Value Date   ALT 13 (L) 10/03/2016   AST 17 10/03/2016   ALKPHOS 56 10/03/2016   BILITOT 0.6 10/03/2016   Lab Results  Component Value Date   HGBA1C 5.7 (H) 02/09/2019   Lab Results  Component Value Date   INSULIN 21.5 02/09/2019   Lab Results  Component Value Date   WBC 13.3 (H) 10/03/2016   HGB 11.9 (L) 10/03/2016   HCT 35.5 (L) 10/03/2016   MCV 78.2 10/03/2016   PLT 272 10/03/2016   Attestation Statements:   Reviewed by clinician on day of visit: allergies, medications, problem list, medical history, surgical history, family history, social history, and previous encounter notes.  I, Water quality scientist, CMA, am acting as transcriptionist for Briscoe Deutscher, DO  I have reviewed the above documentation for accuracy and  completeness, and I agree with the above. Briscoe Deutscher, DO

## 2020-01-10 ENCOUNTER — Encounter (INDEPENDENT_AMBULATORY_CARE_PROVIDER_SITE_OTHER): Payer: Self-pay

## 2020-01-29 ENCOUNTER — Other Ambulatory Visit (INDEPENDENT_AMBULATORY_CARE_PROVIDER_SITE_OTHER): Payer: Self-pay | Admitting: Family Medicine

## 2020-01-29 DIAGNOSIS — E559 Vitamin D deficiency, unspecified: Secondary | ICD-10-CM

## 2020-01-30 ENCOUNTER — Ambulatory Visit (INDEPENDENT_AMBULATORY_CARE_PROVIDER_SITE_OTHER): Payer: BC Managed Care – PPO | Admitting: Family Medicine

## 2020-01-30 ENCOUNTER — Other Ambulatory Visit: Payer: Self-pay

## 2020-01-30 ENCOUNTER — Encounter (INDEPENDENT_AMBULATORY_CARE_PROVIDER_SITE_OTHER): Payer: Self-pay | Admitting: Family Medicine

## 2020-01-30 VITALS — BP 126/86 | HR 93 | Temp 98.4°F | Ht 63.0 in | Wt 299.0 lb

## 2020-01-30 DIAGNOSIS — E559 Vitamin D deficiency, unspecified: Secondary | ICD-10-CM

## 2020-01-30 DIAGNOSIS — Z6841 Body Mass Index (BMI) 40.0 and over, adult: Secondary | ICD-10-CM | POA: Diagnosis not present

## 2020-01-30 MED ORDER — WEGOVY 0.25 MG/0.5ML ~~LOC~~ SOAJ: 0.5000 mg | Freq: Every day | SUBCUTANEOUS | 0 refills | Status: DC

## 2020-01-30 MED ORDER — VITAMIN D (ERGOCALCIFEROL) 1.25 MG (50000 UNIT) PO CAPS: 50000.0000 [IU] | ORAL_CAPSULE | ORAL | 0 refills | Status: DC

## 2020-01-30 NOTE — Progress Notes (Signed)
Chief Complaint:   OBESITY Vanessa Wilcox is here to discuss her progress with her obesity treatment plan along with follow-up of her obesity related diagnoses. Vanessa Wilcox is on the Category 2 Plan and states she is following her eating plan approximately 0% of the time. Vanessa Wilcox states she is doing 0 minutes 0 times per week.  Today's visit was #: 18 Starting weight: 306 lbs Starting date: 02/12/2019 Today's weight: 297 lbs Today's date: 01/30/2020 Total lbs lost to date: 9 Total lbs lost since last in-office visit: 2  Interim History: Vanessa Wilcox had originally been on phentermine, but she didn't remember to take it regularly and didn't feel it helped. She recently started Ozempic with the idea to change to [address] (per patient). She hasn't concentrated on nutrition but she has tried to increase her water intake.  Subjective:   1. Vitamin D deficiency Vanessa Wilcox is stable on Vit D, and she denies nausea or vomiting. She requests a refill today.  Assessment/Plan:   1. Vitamin D deficiency Low Vitamin D level contributes to fatigue and are associated with obesity, breast, and colon cancer. We will refill prescription Vitamin D for 1 month. Vanessa Wilcox will follow-up for routine testing of Vitamin D, at least 2-3 times per year to avoid over-replacement. We will recheck labs in 1-2 months.  - Vitamin D, Ergocalciferol, (DRISDOL) 1.25 MG (50000 UNIT) CAPS capsule; Take 1 capsule (50,000 Units total) by mouth every 7 (seven) days.  Dispense: 4 capsule; Refill: 0  2. Class 3 severe obesity with serious comorbidity and body mass index (BMI) of 50.0 to 59.9 in adult, unspecified obesity type Vanessa Wilcox) Vanessa Wilcox is currently in the action stage of change. As such, her goal is to continue with weight loss efforts. She has agreed to keeping a food journal and adhering to recommended goals of 1200-1300 calories and 75+ grams of protein daily.   We discussed various medication options to help Vanessa Wilcox with her weight loss efforts  and we both agreed to increase Vanessa Wilcox to 0.5 mg q daily and will follow up as directed.  - Semaglutide-Weight Management (Vanessa Wilcox) 0.25 MG/0.5ML SOAJ; Inject 0.5 mg into the skin daily.  Dispense: 2.24 mL; Refill: 0  Exercise goals: Vanessa Wilcox is to start 15 minute walking tape.  Behavioral modification strategies: increasing lean protein intake and meal planning and cooking strategies.  Vanessa Wilcox has agreed to follow-up with our clinic in 3 weeks. She was informed of the importance of frequent follow-up visits to maximize her success with intensive lifestyle modifications for her multiple health conditions.   Objective:   Blood pressure (!) 126/86, pulse 93, temperature 98.4 F (36.9 C), temperature source Oral, height 5\' 3"  (1.6 m), weight (!) 299 lb (135.6 kg), SpO2 97 %. Body mass index is 52.97 kg/m.  General: Cooperative, alert, well developed, in no acute distress. HEENT: Conjunctivae and lids unremarkable. Cardiovascular: Regular rhythm.  Lungs: Normal work of breathing. Neurologic: No focal deficits.   Lab Results  Component Value Date   CREATININE 0.66 10/03/2016   BUN 6 10/03/2016   NA 136 10/03/2016   K 3.9 10/03/2016   CL 104 10/03/2016   CO2 22 10/03/2016   Lab Results  Component Value Date   ALT 13 (L) 10/03/2016   AST 17 10/03/2016   ALKPHOS 56 10/03/2016   BILITOT 0.6 10/03/2016   Lab Results  Component Value Date   HGBA1C 5.7 (H) 02/09/2019   Lab Results  Component Value Date   INSULIN 21.5 02/09/2019   No  results found for: TSH No results found for: CHOL, HDL, LDLCALC, LDLDIRECT, TRIG, CHOLHDL Lab Results  Component Value Date   WBC 13.3 (H) 10/03/2016   HGB 11.9 (L) 10/03/2016   HCT 35.5 (L) 10/03/2016   MCV 78.2 10/03/2016   PLT 272 10/03/2016   No results found for: IRON, TIBC, FERRITIN  Attestation Statements:   Reviewed by clinician on day of visit: allergies, medications, problem list, medical history, surgical history, family history,  social history, and previous encounter notes.  Time spent on visit including pre-visit chart review and post-visit care and charting was 33 minutes.    I, Vanessa Wilcox, am acting as transcriptionist for Dennard Nip, MD.  I have reviewed the above documentation for accuracy and completeness, and I agree with the above. -  Dennard Nip, MD

## 2020-01-31 ENCOUNTER — Ambulatory Visit (INDEPENDENT_AMBULATORY_CARE_PROVIDER_SITE_OTHER): Payer: BC Managed Care – PPO | Admitting: Family Medicine

## 2020-02-12 ENCOUNTER — Encounter (INDEPENDENT_AMBULATORY_CARE_PROVIDER_SITE_OTHER): Payer: Self-pay | Admitting: Family Medicine

## 2020-02-22 ENCOUNTER — Encounter (INDEPENDENT_AMBULATORY_CARE_PROVIDER_SITE_OTHER): Payer: Self-pay | Admitting: Family Medicine

## 2020-02-22 ENCOUNTER — Other Ambulatory Visit: Payer: Self-pay

## 2020-02-22 ENCOUNTER — Ambulatory Visit (INDEPENDENT_AMBULATORY_CARE_PROVIDER_SITE_OTHER): Payer: BC Managed Care – PPO | Admitting: Family Medicine

## 2020-02-22 VITALS — BP 133/76 | HR 101 | Temp 98.5°F | Ht 63.0 in | Wt 296.0 lb

## 2020-02-22 DIAGNOSIS — Z803 Family history of malignant neoplasm of breast: Secondary | ICD-10-CM | POA: Diagnosis not present

## 2020-02-22 DIAGNOSIS — N926 Irregular menstruation, unspecified: Secondary | ICD-10-CM

## 2020-02-22 DIAGNOSIS — Z9189 Other specified personal risk factors, not elsewhere classified: Secondary | ICD-10-CM

## 2020-02-22 DIAGNOSIS — E8881 Metabolic syndrome: Secondary | ICD-10-CM | POA: Diagnosis not present

## 2020-02-22 DIAGNOSIS — Z6841 Body Mass Index (BMI) 40.0 and over, adult: Secondary | ICD-10-CM

## 2020-02-22 DIAGNOSIS — R7303 Prediabetes: Secondary | ICD-10-CM | POA: Diagnosis not present

## 2020-02-23 MED ORDER — OZEMPIC (0.25 OR 0.5 MG/DOSE) 2 MG/1.5ML ~~LOC~~ SOPN: 0.5000 mg | PEN_INJECTOR | SUBCUTANEOUS | 0 refills | Status: AC

## 2020-02-23 MED ORDER — INSULIN PEN NEEDLE 32G X 4 MM MISC: 1.0000 | 0 refills | Status: DC

## 2020-02-23 MED ORDER — OZEMPIC (1 MG/DOSE) 4 MG/3ML ~~LOC~~ SOPN: 1.0000 mg | PEN_INJECTOR | SUBCUTANEOUS | 0 refills | Status: DC

## 2020-02-26 NOTE — Progress Notes (Signed)
Chief Complaint:   OBESITY Vanessa Wilcox is here to discuss her progress with her obesity treatment plan along with follow-up of her obesity related diagnoses. Vanessa Wilcox is on the Category 2 Plan and states she is following her eating plan approximately 25% of the time. Vanessa Wilcox states she is walking more stairs for exercise.  Today's visit was #: 18 Starting weight: 306 lbs Starting date: 02/12/2019 Today's weight: 296 lbs Today's date: 02/22/2020 Total lbs lost to date: 10 lbs Total lbs lost since last in-office visit: 3 lb  Interim History: Today's bioimpedance results indicate that Vanessa Wilcox has gained 5 pounds of water weight since her last visit.  She says that school is starting on Monday.  Assessment/Plan:   1. Metabolic syndrome Goal: Lose 7-10% of starting weight. Counseling: Intensive lifestyle modifications are the first line treatment for this issue. We discussed several lifestyle modifications today and she will continue to work on diet, exercise and weight loss efforts.   Orders - Semaglutide,0.25 or 0.5MG /DOS, (OZEMPIC, 0.25 OR 0.5 MG/DOSE,) 2 MG/1.5ML SOPN; Inject 0.375 mLs (0.5 mg total) into the skin once a week for 14 days.  Dispense: 1.5 mL; Refill: 0 - Semaglutide, 1 MG/DOSE, (OZEMPIC, 1 MG/DOSE,) 4 MG/3ML SOPN; Inject 0.75 mLs (1 mg total) into the skin once a week.  Dispense: 3 mL; Refill: 0 - Insulin Pen Needle 32G X 4 MM MISC; 1 each by Does not apply route once a week.  Dispense: 50 each; Refill: 0  2. Prediabetes Not at goal. Goal is HgbA1c < 5.7 and insulin level closer to 5. Vanessa Wilcox will continue to work on weight loss, exercise, and decreasing simple carbohydrates to help decrease the risk of diabetes.   3. Irregular menses Will refer Vanessa Wilcox to Dr. Sumner Boast in GYN, as per below.   Orders - Ambulatory referral to Gynecology  4. Family history of breast cancer Will refer Vanessa Wilcox to Dr. Sumner Boast in GYN, as per below.   Orders - Ambulatory referral to  Gynecology  5. At risk for heart disease Vanessa Wilcox was given approximately 15 minutes of coronary artery disease prevention counseling today. She is 39 y.o. female and has risk factors for heart disease including obesity. We discussed intensive lifestyle modifications today with an emphasis on specific weight loss instructions and strategies.   Repetitive spaced learning was employed today to elicit superior memory formation and behavioral change.  6. Class 3 severe obesity with serious comorbidity and body mass index (BMI) of 50.0 to 59.9 in adult, unspecified obesity type [facility]) Vanessa Wilcox is currently in the action stage of change. As such, her goal is to continue with weight loss efforts. She has agreed to the Category 2 Plan.   Exercise goals: For substantial health benefits, adults should do at least 150 minutes (2 hours and 30 minutes) a week of moderate-intensity, or 75 minutes (1 hour and 15 minutes) a week of vigorous-intensity aerobic physical activity, or an equivalent combination of moderate- and vigorous-intensity aerobic activity. Aerobic activity should be performed in episodes of at least 10 minutes, and preferably, it should be spread throughout the week.  Behavioral modification strategies: increasing lean protein intake.  Vanessa Wilcox has agreed to follow-up with our clinic in 3 weeks. She was informed of the importance of frequent follow-up visits to maximize her success with intensive lifestyle modifications for her multiple health conditions.   Objective:   Blood pressure 133/76, pulse (!) 101, temperature 98.5 F (36.9 C), temperature source Oral, height 5\' 3"  (1.6 m),  weight 296 lb (134.3 kg), SpO2 97 %. Body mass index is 52.43 kg/m.  General: Cooperative, alert, well developed, in no acute distress. HEENT: Conjunctivae and lids unremarkable. Cardiovascular: Regular rhythm.  Lungs: Normal work of breathing. Neurologic: No focal deficits.   Lab Results  Component Value Date     CREATININE 0.66 10/03/2016   BUN 6 10/03/2016   NA 136 10/03/2016   K 3.9 10/03/2016   CL 104 10/03/2016   CO2 22 10/03/2016   Lab Results  Component Value Date   ALT 13 (L) 10/03/2016   AST 17 10/03/2016   ALKPHOS 56 10/03/2016   BILITOT 0.6 10/03/2016   Lab Results  Component Value Date   HGBA1C 5.7 (H) 02/09/2019   Lab Results  Component Value Date   INSULIN 21.5 02/09/2019   Lab Results  Component Value Date   WBC 13.3 (H) 10/03/2016   HGB 11.9 (L) 10/03/2016   HCT 35.5 (L) 10/03/2016   MCV 78.2 10/03/2016   PLT 272 10/03/2016   Attestation Statements:   Reviewed by clinician on day of visit: allergies, medications, problem list, medical history, surgical history, family history, social history, and previous encounter notes.  I, Water quality scientist, CMA, am acting as transcriptionist for Briscoe Deutscher, DO  I have reviewed the above documentation for accuracy and completeness, and I agree with the above. Briscoe Deutscher, DO

## 2020-03-05 ENCOUNTER — Other Ambulatory Visit (INDEPENDENT_AMBULATORY_CARE_PROVIDER_SITE_OTHER): Payer: Self-pay | Admitting: Family Medicine

## 2020-03-05 ENCOUNTER — Encounter (INDEPENDENT_AMBULATORY_CARE_PROVIDER_SITE_OTHER): Payer: Self-pay

## 2020-03-05 DIAGNOSIS — E559 Vitamin D deficiency, unspecified: Secondary | ICD-10-CM

## 2020-03-05 NOTE — Telephone Encounter (Signed)
My chart message sent to pt.

## 2020-03-19 ENCOUNTER — Ambulatory Visit (INDEPENDENT_AMBULATORY_CARE_PROVIDER_SITE_OTHER): Payer: BC Managed Care – PPO | Admitting: Family Medicine

## 2020-03-21 ENCOUNTER — Other Ambulatory Visit: Payer: Self-pay

## 2020-03-21 ENCOUNTER — Ambulatory Visit (INDEPENDENT_AMBULATORY_CARE_PROVIDER_SITE_OTHER): Payer: BC Managed Care – PPO | Admitting: Family Medicine

## 2020-03-21 ENCOUNTER — Encounter (INDEPENDENT_AMBULATORY_CARE_PROVIDER_SITE_OTHER): Payer: Self-pay | Admitting: Family Medicine

## 2020-03-21 VITALS — BP 126/85 | HR 71 | Temp 98.1°F | Ht 63.0 in | Wt 296.0 lb

## 2020-03-21 DIAGNOSIS — F3289 Other specified depressive episodes: Secondary | ICD-10-CM

## 2020-03-21 DIAGNOSIS — Z6841 Body Mass Index (BMI) 40.0 and over, adult: Secondary | ICD-10-CM | POA: Diagnosis not present

## 2020-03-21 DIAGNOSIS — E8881 Metabolic syndrome: Secondary | ICD-10-CM | POA: Diagnosis not present

## 2020-03-21 MED ORDER — OZEMPIC (1 MG/DOSE) 4 MG/3ML ~~LOC~~ SOPN: 1.0000 mg | PEN_INJECTOR | SUBCUTANEOUS | 0 refills | Status: DC

## 2020-03-21 MED ORDER — WEGOVY 1.7 MG/0.75ML ~~LOC~~ SOAJ: 1.7000 mg | SUBCUTANEOUS | 0 refills | Status: DC

## 2020-03-21 MED ORDER — WEGOVY 2.4 MG/0.75ML ~~LOC~~ SOAJ: 2.4000 mg | SUBCUTANEOUS | 0 refills | Status: DC

## 2020-03-26 NOTE — Progress Notes (Signed)
Chief Complaint:   OBESITY Vanessa Wilcox is here to discuss her progress with her obesity treatment plan along with follow-up of her obesity related diagnoses. [name] is on the Category 2 Plan and states she is following her eating plan approximately 0% of the time. [name] states she is exercising for 0 minutes 0 times per week.  Today's visit was #: 20 Starting weight: 306 lbs Starting date: 02/12/2019 Today's weight: 296 lbs Today's date: 03/21/2020 Total lbs lost to date: 10 lbs Total lbs lost since last in-office visit: 0 Total weight loss percentage to date: -3.27%  Interim History: [name] has been eating breakfast and doing meal prep for lunch.  She is drinking 3 bottles of water per day. She endorses emotional eating due to increased stress.  Assessment/Plan:   1. Metabolic syndrome Goal: Lose 7-10% of starting weight. Counseling: Intensive lifestyle modifications are the first line treatment for this issue. We discussed several lifestyle modifications today and she will continue to work on diet, exercise and weight loss efforts.   - Semaglutide, 1 MG/DOSE, (OZEMPIC, 1 MG/DOSE,) 4 MG/3ML SOPN; Inject 1 mg into the skin once a week.  Dispense: 3 mL; Refill: 0  2. Other depression, with emotional eating Patient was referred to Dr. Mallie Mussel, our Bariatric Psychologist, for evaluation due to her elevated PHQ-9 score and significant struggles with emotional eating.  3. Class 3 severe obesity with serious comorbidity and body mass index (BMI) of 50.0 to 59.9 in adult, unspecified obesity type ([place]) I will go ahead and prescribe the advanced doses of the regimen in order to avoid delay in future prescriptions since there is a supply/demand issue with this medication currently. The most common side effect you may experience when increasing Semaglutide is nausea. If you do experience nausea, here are some things that may help: Eat bland foods. Eat foods that contain water, like soups and  gelatin. Do not lie down after you eat. Go outside to get some fresh air.  - Semaglutide-Weight Management (WEGOVY) 1.7 MG/0.75ML SOAJ; Inject 1.7 mg into the skin once a week.  Dispense: 3 mL; Refill: 0 - Semaglutide-Weight Management (WEGOVY) 2.4 MG/0.75ML SOAJ; Inject 2.4 mg into the skin once a week.  Dispense: 3 mL; Refill: 0  Wave is currently in the action stage of change. As such, her goal is to continue with weight loss efforts. She has agreed to the Category 2 Plan.   Exercise goals: For substantial health benefits, adults should do at least 150 minutes (2 hours and 30 minutes) a week of moderate-intensity, or 75 minutes (1 hour and 15 minutes) a week of vigorous-intensity aerobic physical activity, or an equivalent combination of moderate- and vigorous-intensity aerobic activity. Aerobic activity should be performed in episodes of at least 10 minutes, and preferably, it should be spread throughout the week.  Behavioral modification strategies: increasing lean protein intake and increasing water intake.  [name] has agreed to follow-up with our clinic in 3 weeks. She was informed of the importance of frequent follow-up visits to maximize her success with intensive lifestyle modifications for her multiple health conditions.   Objective:   Blood pressure 126/85, pulse 71, temperature 98.1 F (36.7 C), temperature source Oral, height 5\' 3"  (1.6 m), weight 296 lb (134.3 kg), SpO2 97 %. Body mass index is 52.43 kg/m.  General: Cooperative, alert, well developed, in no acute distress. HEENT: Conjunctivae and lids unremarkable. Cardiovascular: Regular rhythm.  Lungs: Normal work of breathing. Neurologic: No focal deficits.   Lab  Results  Component Value Date   CREATININE 0.66 10/03/2016   BUN 6 10/03/2016   NA 136 10/03/2016   K 3.9 10/03/2016   CL 104 10/03/2016   CO2 22 10/03/2016   Lab Results  Component Value Date   ALT 13 (L) 10/03/2016   AST 17 10/03/2016   ALKPHOS 56  10/03/2016   BILITOT 0.6 10/03/2016   Lab Results  Component Value Date   HGBA1C 5.7 (H) 02/09/2019   Lab Results  Component Value Date   INSULIN 21.5 02/09/2019   Lab Results  Component Value Date   WBC 13.3 (H) 10/03/2016   HGB 11.9 (L) 10/03/2016   HCT 35.5 (L) 10/03/2016   MCV 78.2 10/03/2016   PLT 272 10/03/2016   Attestation Statements:   Reviewed by clinician on day of visit: allergies, medications, problem list, medical history, surgical history, family history, social history, and previous encounter notes.  I, Water quality scientist, CMA, am acting as transcriptionist for Briscoe Deutscher, DO  I have reviewed the above documentation for accuracy and completeness, and I agree with the above. Briscoe Deutscher, DO

## 2020-03-28 ENCOUNTER — Encounter (INDEPENDENT_AMBULATORY_CARE_PROVIDER_SITE_OTHER): Payer: Self-pay

## 2020-04-01 NOTE — Progress Notes (Signed)
Entered in error

## 2020-04-02 ENCOUNTER — Encounter (INDEPENDENT_AMBULATORY_CARE_PROVIDER_SITE_OTHER): Payer: Self-pay

## 2020-04-03 ENCOUNTER — Telehealth (INDEPENDENT_AMBULATORY_CARE_PROVIDER_SITE_OTHER): Payer: BC Managed Care – PPO | Admitting: Psychology

## 2020-04-04 ENCOUNTER — Encounter (INDEPENDENT_AMBULATORY_CARE_PROVIDER_SITE_OTHER): Payer: BC Managed Care – PPO | Admitting: Psychology

## 2020-04-04 ENCOUNTER — Telehealth (INDEPENDENT_AMBULATORY_CARE_PROVIDER_SITE_OTHER): Payer: Self-pay | Admitting: Psychology

## 2020-04-04 ENCOUNTER — Encounter (INDEPENDENT_AMBULATORY_CARE_PROVIDER_SITE_OTHER): Payer: Self-pay

## 2020-04-04 NOTE — Telephone Encounter (Signed)
  Office: [phone]  /  Fax: [phone]  Date of Encounter: April 04, 2020  Time of Encounter: 3:14pm Duration of Encounter: ~ 7 minutes Provider: Glennie Isle, PsyD  CONTENT: Vanessa Wilcox presented on time for her MyChart Video Visit; however, there were connection issues initially and completed consent forms were not received. This provider joined the appointment around 3:14pm to check in regarding consent forms and Vanessa Wilcox reported she did not realize that today's appointment was scheduled for 60 minutes, noting she had a meeting at 3:45pm. Assistance on returning completed consents was provided and Vanessa Wilcox was receptive to rescheduling today's appointment. She denied any safety concerns, including suicidal and homicidal ideation.   PLAN: Vanessa Wilcox is scheduled for an appointment on April 09, 2020 at 3:00pm via [place] Visit.

## 2020-04-04 NOTE — Progress Notes (Signed)
Office: [phone]  /  Fax: [phone]    Date: April 08, 2020   Appointment Start Time: 3:01pm Duration: 50 minutes Provider: Glennie Isle, Psy.D. Type of Session: Intake for Individual Therapy  Location of Patient: Work Location of Provider: Provider's Home Type of Contact: Telepsychological Visit via MyChart Video Visit  Informed Consent: Prior to proceeding with today's appointment, two pieces of identifying information were obtained. In addition, Vanessa Wilcox's physical location at the time of this appointment was obtained as well a phone number she could be reached at in the event of technical difficulties. Vanessa Wilcox and this provider participated in today's telepsychological service.   The provider's role was explained to [name]. The provider reviewed and discussed issues of confidentiality, privacy, and limits therein (e.g., reporting obligations). In addition to verbal informed consent, written informed consent for psychological services was obtained prior to the initial appointment. Since the clinic is not a 24/7 crisis center, mental health emergency resources were shared and this  provider explained MyChart, e-mail, voicemail, and/or other messaging systems should be utilized only for non-emergency reasons. This provider also explained that information obtained during appointments will be placed in Vanessa Wilcox medical record and relevant information will be shared with other providers at [facility] for coordination of care. Moreover, Vanessa Wilcox agreed information may be shared with other [facility] providers as needed for coordination of care. By signing the service agreement document, Vanessa Wilcox provided written consent for coordination of care. Prior to initiating telepsychological services, Vanessa Wilcox completed an informed consent document, which included the development of a safety plan (i.e., an emergency contact, nearest emergency room, and emergency resources) in  the event of an emergency/crisis. Vanessa Wilcox expressed understanding of the rationale of the safety plan. Vanessa Wilcox verbally acknowledged understanding she is ultimately responsible for understanding her insurance benefits for telepsychological and in-person services. This provider also reviewed confidentiality, as it relates to telepsychological services, as well as the rationale for telepsychological services (i.e., to reduce exposure risk to COVID-19). Vanessa Wilcox  acknowledged understanding that appointments cannot be recorded without both party consent and she is aware she is responsible for securing confidentiality on her end of the session. Vanessa Wilcox verbally consented to proceed.  Chief Complaint/HPI: Vanessa Wilcox was referred by Dr. Briscoe Deutscher due to other depression, with emotional eating. Per the note for the visit with Dr. Briscoe Deutscher on March 21, 2020, "Patient was referred to Dr. Mallie Mussel, our Bariatric Psychologist, for evaluation due to her elevated PHQ-9 score and significant struggles with emotional eating." Vanessa Wilcox previously met with this provider from August 2020 to February 2021.   During today's appointment, Vanessa Wilcox shared her sister and father passed away since the last appointment with this provider. She explained she is "at peace" with the losses, but feels she is unable "to get back on track" with her eating habits. Terry discussed she feels she has hit a plateau with weight loss and described challenges with finding a balance between work responsibilities and personal goals. Vanessa Wilcox denied engaging in binge eating, but described engaging in some emotional eating behaviors (e.g., eating fast food due to fatigue). Moreover, she described feeling like she is "just trying to keep up" with day to day tasks.   Mental Status Examination:  Appearance: well groomed and appropriate hygiene  Behavior: appropriate to circumstances Mood: euthymic Affect: mood congruent Speech: normal in rate, volume, and  tone Eye Contact: appropriate Psychomotor Activity: appropriate Gait: unable to assess Thought Process: linear, logical, and goal directed  Thought Content/Perception: denies  suicidal and homicidal ideation, plan, and intent, no hallucinations, delusions, bizarre thinking or behavior reported or observed and denies ideation and engagement in self-injurious behaviors Orientation: time, person, place, and purpose of appointment Memory/Concentration: memory, attention, language, and fund of knowledge intact  Insight/Judgment: good  Family & Psychosocial History: Vanessa Wilcox reported she is married and she has four children. 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 Additionally, Pleasant shared her highest level of education obtained is a bachelor's degree. Currently, Vanessa Wilcox's social support system consists of her husband, son, siblings, handful of friends, and church friends. Moreover, Vanessa Wilcox stated she resides with her husband, son, and nephew.   Medical History:  Past Medical History:  Diagnosis Date  . Anxiety   . Back pain   . Depression   . Gestational hypertension   . High blood pressure    Past Surgical History:  Procedure Laterality Date  . ASPIRATION BIOPSY     fluid around heart- negative results in 2000   Current Outpatient Medications on File Prior to Visit  Medication Sig Dispense Refill  . Insulin Pen Needle 32G X 4 MM MISC 1 each by Does not apply route once a week. 50 each 0  . norgestrel-ethinyl estradiol (CRYSELLE-28) 0.3-30 MG-MCG tablet Take 1 tablet by mouth daily. 84 tablet 0  . Semaglutide, 1 MG/DOSE, (OZEMPIC, 1 MG/DOSE,) 4 MG/3ML SOPN Inject 1 mg into the skin once a week. 3 mL 0  . Semaglutide-Weight Management (WEGOVY) 1.7 MG/0.75ML SOAJ Inject 1.7 mg into the skin once a week. 3 mL 0  . Semaglutide-Weight Management (WEGOVY) 2.4 MG/0.75ML SOAJ Inject 2.4 mg into the skin once a week. 3 mL 0  . Vitamin D, Ergocalciferol, (DRISDOL) 1.25 MG (50000  UNIT) CAPS capsule Take 1 capsule (50,000 Units total) by mouth every 7 (seven) days. 4 capsule 0   No current facility-administered medications on file prior to visit.  Vanessa Wilcox denied a history of head injuries and loss of consciousness.    Mental Health History: Vanessa Wilcox reported she has not attended therapeutic services since the last appointment with this provider. She denied current psychotropic medications. Vanessa Wilcox reported there is no history of hospitalizations for psychiatric concerns. Vanessa Wilcox stated her niece is diagnosed with schizophrenia. Vanessa Wilcox reported there is no history of trauma including psychological, physical  and sexual abuse, as well as neglect.   Vanessa Wilcox described her typical mood lately as "okay." She described feeling "disappointed" in herself. 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 Vanessa Wilcox denied current alcohol use. She denied tobacco use. She denied illicit/recreational substance use. Regarding caffeine intake, Vanessa Wilcox reported consuming 32oz of soda daily. Furthermore, Vanessa Wilcox indicated she is not experiencing the following: hallucinations and delusions, paranoia, symptoms of mania , social withdrawal, crying spells, panic attacks and decreased motivation. During the initial appointment with this provider on February 15, 2019, Yanil shared a history of suicidal ideation. Since the last appointment with this provider, Romayne denied experiencing suicidal ideation, plan, and intent. She denied a history of and current homicidal ideation, plan, and intent and history of and ideation of and engagement in self-harm.  The following strengths were reported by Seth Bake: taking it a day at a time and self-awareness. The following strengths were observed by this provider: ability to express thoughts and feelings during the therapeutic session, ability to establish and benefit from a therapeutic  relationship, willingness to work toward established goal(s) with the clinic and ability  to engage in reciprocal conversation.   Legal History: Katti reported there is no history of legal involvement.   Structured Assessments Results: The Patient Health Questionnaire-9 (PHQ-9) is a self-report measure that assesses symptoms and severity of depression over the course of the last two weeks. Sheneka obtained a score of 5 suggesting mild depression. Sabreena finds the endorsed symptoms to be somewhat difficult. [0= Not at all; 1= Several days; 2= More than half the days; 3= Nearly every day] Little interest or pleasure in doing things 0  Feeling down, depressed, or hopeless 0  Trouble falling or staying asleep, or sleeping too much 1  Feeling tired or having little energy 1  Poor appetite or overeating 1  Feeling bad about yourself --- or that you are a failure or have let yourself or your family down 1  Trouble concentrating on things, such as reading the newspaper or watching television 1  Moving or speaking so slowly that other people could have noticed? Or the opposite --- being so fidgety or restless that you have been moving around a lot more than usual 0  Thoughts that you would be better off dead or hurting yourself in some way 0  PHQ-9 Score 5    The Generalized Anxiety Disorder-7 (GAD-7) is a brief self-report measure that assesses symptoms of anxiety over the course of the last two weeks. Cristen obtained a score of 3 suggesting minimal anxiety. Dennette finds the endorsed symptoms to be somewhat difficult. [0= Not at all; 1= Several days; 2= Over half the days; 3= Nearly every day] Feeling nervous, anxious, on edge 0  Not being able to stop or control worrying 1  Worrying too much about different things 1  Trouble relaxing 1  Being so restless that it's hard to sit still 0  Becoming easily annoyed or irritable 0  Feeling afraid as if something awful might happen 0  GAD-7 Score 3    Interventions:  Conducted a chart review Verbally administered PHQ-9 and GAD-7 for symptom monitoring Provided emphatic reflections and validation Collaborated with patient on a treatment goal  Psychoeducation provided regarding pleasurable activities Psychoeducation provided regarding self-care  Provisional DSM-5 Diagnosis(es): 311 (F32.8) Other Specified Depressive Disorder, Emotional Eating Behaviors  Plan: Jesi appears able and willing to participate as evidenced by collaboration on a treatment goal, engagement in reciprocal conversation, and asking questions as needed for clarification. The next appointment will be scheduled in two weeks, which will be via MyChart Video Visit. The following treatment goal was established: increase coping skills. This provider will regularly review the treatment plan and medical chart to keep informed of status changes. Renita expressed understanding and agreement with the initial treatment plan of care. Reviewed the importance of self-care utilizing the oxygen mask metaphor was . She noted, "I don't even remember any of these strategies." Psychoeducation regarding pleasurable activities, including its impact on emotional eating and overall well-being was reviewed. Kaliya was provided with a handout with various options of pleasurable activities, and was encouraged to engage in one activity a day and additional activities as needed when triggered to emotionally eat. Seth Bake agreed. Ashara provided verbal consent during today's appointment for this provider to send a handout with pleasurable activities via e-mail.   Notably, this provider discussed her upcoming maternity leave toward the end of November. Hadassah acknowledged understanding given the uncertain nature of the circumstances, this provider may be out of the office sooner. This provider and Seth Bake discussed referral options and verbal consent was provided  for this provider to send a list of referral  options via e-mail. All questions/concerns were addressed. Shakirah denied any concerns.

## 2020-04-08 ENCOUNTER — Telehealth (INDEPENDENT_AMBULATORY_CARE_PROVIDER_SITE_OTHER): Payer: BC Managed Care – PPO | Admitting: Psychology

## 2020-04-08 DIAGNOSIS — F3289 Other specified depressive episodes: Secondary | ICD-10-CM | POA: Diagnosis not present

## 2020-04-08 NOTE — Progress Notes (Signed)
  Office: [phone]  /  Fax: [phone]    Date: April 22, 2020   Appointment Start Time: 2:54pm Duration: 22 minutes Provider: Glennie Isle, Psy.D. Type of Session: Individual Therapy  Location of Patient: Work Location of Provider: Provider's Home Type of Contact: Telepsychological Visit via MyChart Video Visit  Session Content: [place] is a 39 y.o. female presenting for a follow-up appointment to address the previously established treatment goal of increasing coping skills. Today's appointment was a telepsychological visit due to COVID-19. Rebel provided verbal consent for today's telepsychological appointment and she is aware she is responsible for securing confidentiality on her end of the session. Prior to proceeding with today's appointment, Carole's physical location at the time of this appointment was obtained as well a phone number she could be reached at in the event of technical difficulties. Njeri and this provider participated in today's telepsychological service.   This provider conducted a brief check-in. Claira shared about recent engagement in pleasurable activities. Positive reinforcement was provided. She acknowledged she did not focus on her eating habits; however, discussed an increase in water intake. To assist with eating habits, psychoeducation regarding SMART goals was provided and Suellen was engaged in goal setting. The following goal was established: Azia will eat breakfast congruent to her meal plan at least 3 out of 7 days a week between now and the next appointment with this provider. Sariah was receptive to today's appointment as evidenced by openness to sharing, responsiveness to feedback, and willingness to work toward the established SMART goal.  Mental Status Examination:  Appearance: well groomed and appropriate hygiene  Behavior: appropriate to circumstances Mood: euthymic Affect: mood congruent Speech: normal in rate, volume, and tone Eye  Contact: appropriate Psychomotor Activity: appropriate Gait: unable to assess Thought Process: linear, logical, and goal directed  Thought Content/Perception: no hallucinations, delusions, bizarre thinking or behavior reported or observed and no evidence of suicidal and homicidal ideation, plan, and intent Orientation: time, person, place, and purpose of appointment Memory/Concentration: memory, attention, language, and fund of knowledge intact  Insight/Judgment: good  Interventions:  Conducted a brief chart review Provided empathic reflections and validation Reviewed content from the previous session Provided positive reinforcement Employed supportive psychotherapy interventions to facilitate reduced distress and to improve coping skills with identified stressors Engaged patient in goal setting Psychoeducation provided regarding SMART goals   DSM-5 Diagnosis(es): 311 (F32.8) Other Specified Depressive Disorder, Emotional Eating Behaviors  Treatment Goal & Progress: During the recent initial appointment with this provider, the following treatment goal was established: increase coping skills. Lounette has demonstrated progress in her goal as evidenced by willingness to continue engaging in pleasurable activities and focusing on water intake.   Plan: The next appointment will be scheduled in two weeks, which will be via MyChart Video Visit. The next session will focus on working towards the established treatment goal.

## 2020-04-09 ENCOUNTER — Ambulatory Visit (INDEPENDENT_AMBULATORY_CARE_PROVIDER_SITE_OTHER): Payer: BC Managed Care – PPO | Admitting: Family Medicine

## 2020-04-11 MED FILL — WEGOVY 1.7 MG/0.75ML SOAJ: 1.7 | 28 days supply | Qty: 3 | Fill #0

## 2020-04-19 ENCOUNTER — Encounter: Payer: Self-pay | Admitting: Obstetrics and Gynecology

## 2020-04-22 ENCOUNTER — Telehealth (INDEPENDENT_AMBULATORY_CARE_PROVIDER_SITE_OTHER): Payer: BC Managed Care – PPO | Admitting: Psychology

## 2020-04-22 DIAGNOSIS — F3289 Other specified depressive episodes: Secondary | ICD-10-CM | POA: Diagnosis not present

## 2020-04-22 NOTE — Progress Notes (Signed)
  Office: [phone]  /  Fax: [phone]    Date: May 06, 2020   Appointment Start Time: 9:30am Duration: 26 minutes Provider: Glennie Isle, Psy.D. Type of Session: Individual Therapy  Location of Patient: Home Location of Provider: Provider's Home Type of Contact: Telepsychological Visit via MyChart Video Visit  Session Content: Vanessa Wilcox is a 39 y.o. female presenting for a follow-up appointment to address the previously established treatment goal of increasing coping skills. Today's appointment was a telepsychological visit due to COVID-19. Vanessa Wilcox provided verbal consent for today's telepsychological appointment and she is aware she is responsible for securing confidentiality on her end of the session. Prior to proceeding with today's appointment, Vanessa Wilcox's physical location at the time of this appointment was obtained as well a phone number she could be reached at in the event of technical difficulties. Vanessa Wilcox and this provider participated in today's telepsychological service.   This provider conducted a brief check-in . Vanessa Wilcox shared she met her previously established SMART goal for breakfast, and exceeded it. Positive reinforcement was provided. This provider and Vanessa Wilcox discussed what helped her be successful. Additionally, she discussed an increase in water intake and described feeling more organized with her other meals. At this time, Vanessa Wilcox feels she is back on track, noting she "just needed a little validation." She expressed desire to discuss strategies for the upcoming holidays. As such, psychoeducation regarding making better choices and engaging in portion control during the holidays/celebrations was provided. More specifically, this provider discussed the following strategies: coming to meals hungry, but not starving; managing portion sizes; not completely depriving yourself; making the plate colorful (e.g., vegetables); pacing yourself (e.g., waiting 10 minutes before going back  for seconds); taking advantage of the nutritious foods; practicing mindfulness; staying hydrated; and avoid bringing home/keeping leftovers. Vanessa Wilcox was receptive to today's appointment as evidenced by openness to sharing, responsiveness to feedback, and willingness to implement discussed strategies .  Mental Status Examination:  Appearance: well groomed and appropriate hygiene  Behavior: appropriate to circumstances Mood: euthymic Affect: mood congruent Speech: normal in rate, volume, and tone Eye Contact: appropriate Psychomotor Activity: appropriate Gait: unable to assess Thought Process: linear, logical, and goal directed  Thought Content/Perception: no hallucinations, delusions, bizarre thinking or behavior reported or observed and no evidence of suicidal and homicidal ideation, plan, and intent Orientation: time, person, place, and purpose of appointment Memory/Concentration: memory, attention, language, and fund of knowledge intact  Insight/Judgment: good  Interventions:  Conducted a brief chart review Provided empathic reflections and validation Reviewed content from the previous session Provided positive reinforcement Employed supportive psychotherapy interventions to facilitate reduced distress and to improve coping skills with identified stressors Discussed behavioral strategies for holidays/celebrations  DSM-5 Diagnosis(es): 311 (F32.8) Other Specified Depressive Disorder, Emotional Eating Behaviors  Treatment Goal & Progress: During the initial appointment with this provider, the following treatment goal was established: increase coping skills. Vanessa Wilcox has demonstrated progress in her goal as evidenced by engagement in pleasurable activities and meeting established SMART goal.   Plan: Per Vanessa Wilcox's request, an additional follow-up appointment was scheduled in approximately two weeks. The next session will focus on working towards the established treatment goal and  termination.

## 2020-05-06 ENCOUNTER — Encounter: Payer: Self-pay | Admitting: Obstetrics and Gynecology

## 2020-05-06 ENCOUNTER — Telehealth (INDEPENDENT_AMBULATORY_CARE_PROVIDER_SITE_OTHER): Payer: BC Managed Care – PPO | Admitting: Psychology

## 2020-05-06 ENCOUNTER — Other Ambulatory Visit: Payer: Self-pay

## 2020-05-06 ENCOUNTER — Ambulatory Visit: Payer: BC Managed Care – PPO | Admitting: Obstetrics and Gynecology

## 2020-05-06 VITALS — BP 122/82 | HR 94 | Ht 63.0 in | Wt 304.0 lb

## 2020-05-06 DIAGNOSIS — Z8041 Family history of malignant neoplasm of ovary: Secondary | ICD-10-CM | POA: Diagnosis not present

## 2020-05-06 DIAGNOSIS — N926 Irregular menstruation, unspecified: Secondary | ICD-10-CM | POA: Diagnosis not present

## 2020-05-06 DIAGNOSIS — Z8742 Personal history of other diseases of the female genital tract: Secondary | ICD-10-CM

## 2020-05-06 DIAGNOSIS — Z3009 Encounter for other general counseling and advice on contraception: Secondary | ICD-10-CM

## 2020-05-06 DIAGNOSIS — Z803 Family history of malignant neoplasm of breast: Secondary | ICD-10-CM

## 2020-05-06 DIAGNOSIS — F3289 Other specified depressive episodes: Secondary | ICD-10-CM | POA: Diagnosis not present

## 2020-05-06 MED ORDER — NORETHINDRONE 0.35 MG PO TABS: 1.0000 | ORAL_TABLET | Freq: Every day | ORAL | 0 refills | Status: DC

## 2020-05-06 NOTE — Progress Notes (Signed)
  Office: [phone]  /  Fax: [phone]    Date: May 15, 2020   Appointment Start Time: 3:50pm Duration: 32 minutes Provider: Glennie Isle, Psy.D. Type of Session: Individual Therapy  Location of Patient: Work Location of Provider: Provider's Home Type of Contact: Telepsychological Visit via MyChart Video Visit  Session Content: Vanessa Wilcox is a 39 y.o. female presenting for a follow-up appointment to address the previously established treatment goal of increasing coping skills. Today's appointment was a telepsychological visit due to COVID-19. Vanessa Wilcox provided verbal consent for today's telepsychological appointment and she is aware she is responsible for securing confidentiality on her end of the session. Prior to proceeding with today's appointment, Vanessa Wilcox's physical location at the time of this appointment was obtained as well a phone number she could be reached at in the event of technical difficulties. Vanessa Wilcox and this provider participated in today's telepsychological service.   This provider conducted a brief check-in. Vanessa Wilcox reported "waking up" yesterday and deciding to increase efforts to eat congruent to her meal plan and increase water intake. Associated thoughts and feelings were processed. She further stated she continues to engage in learned skills; however, acknowledged she struggles with consuming candy in the evenings. This was further explored. She believes she initially began eating candy in the evenings when thinking about work related tasks, but it has become out of habit now. Thus, Vanessa Wilcox was engaged in problem solving to help her cope with work stressors. She agreed to start a journal to write down a to do list when tasks come to mind, and prioritizing the tasks the following morning at the start of her work day to ensure follow through. She was also encouraged to engage in mindfulness exercises/pleasurable activities in the evening to help reduce being on automatic pilot.  Overall, Vanessa Wilcox was receptive to today's appointment as evidenced by openness to sharing, responsiveness to feedback, and willingness to continue engaging in learned skills.  Mental Status Examination:  Appearance: well groomed and appropriate hygiene  Behavior: appropriate to circumstances Mood: euthymic Affect: mood congruent Speech: normal in rate, volume, and tone Eye Contact: appropriate Psychomotor Activity: appropriate Gait: unable to assess Thought Process: linear, logical, and goal directed  Thought Content/Perception: no hallucinations, delusions, bizarre thinking or behavior reported or observed and no evidence of suicidal and homicidal ideation, plan, and intent Orientation: time, person, place, and purpose of appointment Memory/Concentration: memory, attention, language, and fund of knowledge intact  Insight/Judgment: good  Interventions:  Conducted a brief chart review Provided empathic reflections and validation Employed supportive psychotherapy interventions to facilitate reduced distress and to improve coping skills with identified stressors Engaged patient in problem solving  DSM-5 Diagnosis(es): 311 (F32.8) Other Specified Depressive Disorder, Emotional Eating Behaviors  Treatment Goal & Progress: During the initial appointment with this provider, the following treatment goal was established: increase coping skills. Vanessa Wilcox has demonstrated progress in her goal as pleasurable activities, meeting established SMART goal(s), and engaging in learned skills.   Plan: Today was Vanessa Wilcox's last appointment with this provider. She acknowledged understanding that she may request a follow-up appointment with this provider in the future (following this provider's maternity leave as previously discussed) as long as she is still established with the clinic. Per Vanessa Wilcox's request, previously shared referral options were re-sent via e-mail. No further follow-up planned by this provider.

## 2020-05-06 NOTE — Progress Notes (Signed)
39 y.o. Vanessa Wilcox Married [place] American female here for annual exam.    Menstrual cycles are lighter for about one year.  This cycle is more normal per patient.  She takes COC pills for pregnancy prevention following her IUD removal.  Has not taken her pills for the last 2 days.  She used Depo in the past.  Doing weight management with Dr. [name].   She is not taking her HCTZ regularly for her HTN. Only takes it when she has a headache.  Three family deaths in the last year.  Sister deceased of ovarian cancer. Another sister dx with breast cancer and is living.  Her father died of metastatic cancer.  A grandmother passed away as well.  No genetic testing has been done.   School Pharmacist, hospital.   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  Has not done flu vaccine.   PCP:  Donald Prose, MD    Patient's last menstrual period was 04/25/2020 (exact date).     Period Cycle (Days): 30 Period Duration (Days): 4 Period Pattern: Regular Menstrual Flow: Light Menstrual Control: Thin pad Dysmenorrhea: (!) Mild Dysmenorrhea Symptoms: Cramping     Sexually active: Yes.    The current method of family planning is OCP (estrogen/progesterone).    Exercising: Yes.    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    reports that she has never smoked. She has never used smokeless tobacco. She reports that she does not drink alcohol and does not use drugs.  Past Medical History:  Diagnosis Date  . Anxiety   . Back pain   . Depression   . Gestational hypertension   . High blood pressure   . STD (sexually transmitted disease)    Tx'd for chlamydia over 20 years ago    Past Surgical History:   Procedure Laterality Date  . ASPIRATION BIOPSY     fluid around heart- negative results in 2000  . fluid on heart  2000   chest tube placed  . IUD removal under sedation      Current Outpatient Medications  Medication Sig Dispense Refill  . hydrochlorothiazide (HYDRODIURIL) 25 MG tablet Take 25 mg by mouth daily.    . Insulin Pen Needle 32G X 4 MM MISC 1 each by Does not apply route once a week. 50 each 0  . norgestrel-ethinyl estradiol (CRYSELLE-28) 0.3-30 MG-MCG tablet Take 1 tablet by mouth daily. 84 tablet 0  . Semaglutide-Weight Management (WEGOVY) 2.4 MG/0.75ML SOAJ Inject 2.4 mg into the skin once a week. 3 mL 0  . Vitamin D, Ergocalciferol, (DRISDOL) 1.25 MG (50000 UNIT) CAPS capsule Take 1 capsule (50,000 Units total) by mouth every 7 (seven) days. 4 capsule 0  . Semaglutide, 1 MG/DOSE, (OZEMPIC, 1 MG/DOSE,) 4 MG/3ML SOPN Inject 1 mg into the skin once a week. (Patient not taking: Reported on 05/06/2020) 3 mL 0  . Semaglutide-Weight Management (WEGOVY) 1.7 MG/0.75ML SOAJ Inject 1.7 mg into the skin once a week. (Patient not taking: Reported on 05/06/2020) 3 mL 0   No current facility-administered medications for this visit.    Family History  Problem Relation Age of  Onset  . Diabetes Mother   . High blood pressure Mother   . Cancer Mother   . Depression Mother   . Sleep apnea Mother   . Obesity Mother   . Hypertension Mother   . Hyperlipidemia Mother   . Thyroid disease Mother   . High Cholesterol Father   . Cancer Father   . Alcoholism Father   . Ovarian cancer Sister 56       Dec ovarian Cancer  . Breast cancer Sister 43  . Diabetes Maternal Grandmother   . Stroke Maternal Grandfather     Review of Systems  All other systems reviewed and are negative.   Exam:   BP 122/82   Pulse 94   Ht 5\' 3"  (1.6 m)   Wt (!) 304 lb (137.9 kg)   LMP 04/25/2020 (Exact Date)   SpO2 98%   BMI 53.85 kg/m     General appearance: alert, cooperative and appears stated  age Head: normocephalic, without obvious abnormality, atraumatic Neck: no adenopathy, supple, symmetrical, trachea midline and thyroid normal to inspection and palpation Lungs: clear to auscultation bilaterally Breasts: normal appearance, no masses or tenderness, No nipple retraction or dimpling, No nipple discharge or bleeding, No axillary adenopathy Heart: regular rate and rhythm Abdomen: soft, non-tender; no masses, no organomegaly Extremities: extremities normal, atraumatic, no cyanosis or edema Skin: skin color, texture, turgor normal. Keloids.  Lymph nodes: cervical, supraclavicular, and axillary nodes normal. Neurologic: grossly normal  Pelvic: External genitalia:  no lesions              No abnormal inguinal nodes palpated.              Urethra:  normal appearing urethra with no masses, tenderness or lesions              Bartholins and Skenes: normal                 Vagina: normal appearing vagina with normal color and discharge, no lesions              Cervix: no lesions             Bimanual Exam:  Uterus:  normal size, contour, position, consistency, mobility, non-tender              Adnexa: no mass, fullness, tenderness         Chaperone was present for exam.  Assessment:   FH breast and ovarian cancer.  HTN.  On HCTZ rarely.  On COCs.  Personal hx of left ovarian cyst on imaging review. BMI 53.85. Keloid formation.   Plan: Mammogram screening discussed.  She will schedule.  List of facilities to patient.  Self breast awareness reviewed. Stop COCs and start POPs.  Rationale explained.  Instructed in use.  I did educate her that the new birth control pills do not reduce risk of ovarian cancer.  Return for pelvic ultrasound.  Referral made for genetic counseling and testing.   56 minutes  total time was spent for this patient encounter, including preparation, face-to-face counseling with the patient, coordination of care, and documentation of the encounter.

## 2020-05-07 ENCOUNTER — Telehealth: Payer: Self-pay

## 2020-05-07 NOTE — Telephone Encounter (Signed)
Spoke with patient regarding benefits for recommended ultrasound. Patient is aware that ultrasound is transvaginal. Patient acknowledges understanding of information presented. Patient is aware of cancellation policy. Patient scheduled appointment for 05/14/2020 at 0300PM with Brook A. Quincy Simmonds, MD, [name]. Encounter closed.

## 2020-05-09 ENCOUNTER — Other Ambulatory Visit: Payer: Self-pay

## 2020-05-09 ENCOUNTER — Ambulatory Visit (INDEPENDENT_AMBULATORY_CARE_PROVIDER_SITE_OTHER): Payer: BC Managed Care – PPO | Admitting: Adult Health

## 2020-05-09 ENCOUNTER — Encounter (INDEPENDENT_AMBULATORY_CARE_PROVIDER_SITE_OTHER): Payer: Self-pay | Admitting: Adult Health

## 2020-05-09 VITALS — BP 110/77 | HR 101 | Temp 98.1°F | Ht 63.0 in | Wt 297.0 lb

## 2020-05-09 DIAGNOSIS — Z9189 Other specified personal risk factors, not elsewhere classified: Secondary | ICD-10-CM | POA: Diagnosis not present

## 2020-05-09 DIAGNOSIS — R7303 Prediabetes: Secondary | ICD-10-CM

## 2020-05-09 DIAGNOSIS — E559 Vitamin D deficiency, unspecified: Secondary | ICD-10-CM | POA: Diagnosis not present

## 2020-05-09 DIAGNOSIS — Z6841 Body Mass Index (BMI) 40.0 and over, adult: Secondary | ICD-10-CM

## 2020-05-09 DIAGNOSIS — E8881 Metabolic syndrome: Secondary | ICD-10-CM

## 2020-05-09 MED ORDER — OZEMPIC (1 MG/DOSE) 4 MG/3ML ~~LOC~~ SOPN: 1.0000 mg | PEN_INJECTOR | SUBCUTANEOUS | 0 refills | Status: DC

## 2020-05-09 MED ORDER — VITAMIN D (ERGOCALCIFEROL) 1.25 MG (50000 UNIT) PO CAPS: 50000.0000 [IU] | ORAL_CAPSULE | ORAL | 0 refills | Status: DC

## 2020-05-13 ENCOUNTER — Telehealth: Payer: Self-pay | Admitting: Genetic Counselor

## 2020-05-13 NOTE — Progress Notes (Signed)
Chief Complaint:   OBESITY Vanessa Wilcox is here to discuss her progress with her obesity treatment plan along with follow-up of her obesity related diagnoses. Vanessa Wilcox is on the Category 2 Plan and states she is following her eating plan approximately 40% of the time. Vanessa Wilcox states she is exercising 0 minutes 0 times per week.  Today's visit was #: 21 Starting weight: 306 lbs Starting date: 02/09/2019 Today's weight: 297 lbs Today's date: 05/09/2020 Total lbs lost to date: 9 Total lbs lost since last in-office visit: 0  Interim History: Vanessa Wilcox's grandmother (89), sister, and father passed over the last 12 months. She and her husband took in her 32-year-old nephew to assist her brother. 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 She tracking/hitting her goals about 40% of the time.  Subjective:   Vitamin D deficiency. Vitamin D level on 02/09/2019 was 7.6, well below goal of 50. Vanessa Wilcox is on Ergocalciferol. No nausea, vomiting, or muscle weakness.    Ref. Range 02/09/2019 09:20  Vitamin D, 25-Hydroxy Latest Ref Range: 30.0 - 100.0 ng/mL 7.6 (L)   Prediabetes. Vanessa Wilcox has a diagnosis of prediabetes based on her elevated HgA1c and was informed this puts her at greater risk of developing diabetes. She continues to work on diet and exercise to decrease her risk of diabetes. She denies nausea or hypoglycemia. A1c on 02/09/2019 was 5.7. Vanessa Wilcox never started [facility] due to lack of supply and then cost of the prescription. She has titrated up to 1 mg once a week of Semaglutide. She denies mass in neck, dysphagia, dyspepsia, or persistent hoarseness. She denies history of pancreatitis.  Lab Results  Component Value Date   HGBA1C 5.7 (H) 02/09/2019   Lab Results  Component Value Date   INSULIN 21.5 02/09/2019   At risk for dehydration. Vanessa Wilcox is at increased risk for dehydration due to increased water intake and HCTZ.  Assessment/Plan:   Vitamin D deficiency. Low Vitamin D level  contributes to fatigue and are associated with obesity, breast, and colon cancer. She was given a refill on her Vitamin D, Ergocalciferol, (DRISDOL) 1.25 MG (50000 UNIT) CAPS capsule every week #4 with 0 refills and will follow-up for routine testing of Vitamin D, at least 2-3 times per year to avoid over-replacement.   Prediabetes. Vanessa Wilcox will continue to work on weight loss, exercise, and decreasing simple carbohydrates to help decrease the risk of diabetes. Refill was given for Semaglutide, 1 MG/DOSE, (OZEMPIC, 1 MG/DOSE,) 4 MG/3ML SOPN once a week #3 mL with 0 refills.  At risk for dehydration. [name] was given approximately 15 minutes dehydration prevention counseling today. Vanessa Wilcox is at risk for dehydration due to weight loss and current medication(s). She was encouraged to hydrate and monitor fluid status to avoid dehydration as well as weight loss plateaus.   Class 3 severe obesity with serious comorbidity and body mass index (BMI) of 50.0 to 59.9 in adult, unspecified obesity type ([place]).  Vanessa Wilcox is currently in the action stage of change. As such, her goal is to continue with weight loss efforts. She has agreed to keeping a food journal and adhering to recommended goals of 1200-1300 calories and 75 grams of protein daily.   Exercise goals: For substantial health benefits, adults should do at least 150 minutes (2 hours and 30 minutes) a week of moderate-intensity, or 75 minutes (1 hour and 15 minutes) a week of vigorous-intensity aerobic physical activity, or an equivalent combination of moderate- and vigorous-intensity aerobic activity. Aerobic  activity should be performed in episodes of at least 10 minutes, and preferably, it should be spread throughout the week.  Behavioral modification strategies: increasing lean protein intake, increasing water intake, no skipping meals, meal planning and cooking strategies and keeping a strict food journal.  Vanessa Wilcox has agreed to follow-up with our clinic  fasting in 3 weeks. She was informed of the importance of frequent follow-up visits to maximize her success with intensive lifestyle modifications for her multiple health conditions.   Objective:   Blood pressure 110/77, pulse (!) 101, temperature 98.1 F (36.7 C), height 5\' 3"  (1.6 m), weight 297 lb (134.7 kg), last menstrual period 04/25/2020, SpO2 97 %. Body mass index is 52.61 kg/m.  General: Cooperative, alert, well developed, in no acute distress. HEENT: Conjunctivae and lids unremarkable. Cardiovascular: Regular rhythm.  Lungs: Normal work of breathing. Neurologic: No focal deficits.   Lab Results  Component Value Date   CREATININE 0.66 10/03/2016   BUN 6 10/03/2016   NA 136 10/03/2016   K 3.9 10/03/2016   CL 104 10/03/2016   CO2 22 10/03/2016   Lab Results  Component Value Date   ALT 13 (L) 10/03/2016   AST 17 10/03/2016   ALKPHOS 56 10/03/2016   BILITOT 0.6 10/03/2016   Lab Results  Component Value Date   HGBA1C 5.7 (H) 02/09/2019   Lab Results  Component Value Date   INSULIN 21.5 02/09/2019   No results found for: TSH No results found for: CHOL, HDL, LDLCALC, LDLDIRECT, TRIG, CHOLHDL Lab Results  Component Value Date   WBC 13.3 (H) 10/03/2016   HGB 11.9 (L) 10/03/2016   HCT 35.5 (L) 10/03/2016   MCV 78.2 10/03/2016   PLT 272 10/03/2016   No results found for: IRON, TIBC, FERRITIN  Attestation Statements:   Reviewed by clinician on day of visit: allergies, medications, problem list, medical history, surgical history, family history, social history, and previous encounter notes.  I, Michaelene Song, am acting as Location manager for [employer], NP-C   I have reviewed the above documentation for accuracy and completeness, and I agree with the above. -  Charlea Nardo d. Legrand Lasser, NP-C

## 2020-05-13 NOTE — Telephone Encounter (Signed)
Received a genetic counseling referral from Dr. Quincy Simmonds for fhx of breast and ovarian cancer. Ms. Vanessa Wilcox has been cld and scheduled to see Raquel Sarna on 12/16 at 3pm. Pt aware to arrive 15 minutes early.

## 2020-05-14 ENCOUNTER — Other Ambulatory Visit: Payer: Self-pay

## 2020-05-14 ENCOUNTER — Encounter: Payer: Self-pay | Admitting: Obstetrics and Gynecology

## 2020-05-14 ENCOUNTER — Ambulatory Visit (INDEPENDENT_AMBULATORY_CARE_PROVIDER_SITE_OTHER): Payer: BC Managed Care – PPO

## 2020-05-14 ENCOUNTER — Other Ambulatory Visit: Payer: Self-pay | Admitting: Obstetrics and Gynecology

## 2020-05-14 ENCOUNTER — Ambulatory Visit: Payer: BC Managed Care – PPO | Admitting: Obstetrics and Gynecology

## 2020-05-14 VITALS — BP 116/80 | HR 80 | Resp 16 | Wt 304.0 lb

## 2020-05-14 DIAGNOSIS — N83202 Unspecified ovarian cyst, left side: Secondary | ICD-10-CM | POA: Diagnosis not present

## 2020-05-14 DIAGNOSIS — Z8742 Personal history of other diseases of the female genital tract: Secondary | ICD-10-CM | POA: Diagnosis not present

## 2020-05-14 DIAGNOSIS — D219 Benign neoplasm of connective and other soft tissue, unspecified: Secondary | ICD-10-CM | POA: Diagnosis not present

## 2020-05-14 DIAGNOSIS — Z1231 Encounter for screening mammogram for malignant neoplasm of breast: Secondary | ICD-10-CM

## 2020-05-14 DIAGNOSIS — Z8041 Family history of malignant neoplasm of ovary: Secondary | ICD-10-CM | POA: Diagnosis not present

## 2020-05-14 NOTE — Progress Notes (Signed)
GYNECOLOGY  VISIT   HPI: 39 y.o.   Married  [place] American  female   [phone] with Patient's last menstrual period was 04/25/2020 (exact date).   here for ultrasound for FH ovarian cancer.   Hx left ovarian cyst of prior imaging in Epic.   Has pain with her menstruation and is usually on her left side only.   She will see geneticist on 06/20/20.  GYNECOLOGIC HISTORY: Patient's last menstrual period was 04/25/2020 (exact date). Contraception:  Micronor Menopausal hormone therapy:  none Last mammogram:  Patient will call to schedule Last pap smear:   07/18/19 Negative    10/05/16 Neg:Neg HR HPV        OB History    Gravida  3   Para  1   Term      Preterm      AB  2   Living  1     SAB      TAB  2   Ectopic      Multiple      Live Births                 Patient Active Problem List   Diagnosis Date Noted  . Family history of ovarian cancer 05/06/2020  . Family history of breast cancer 05/06/2020  . Class 3 severe obesity with serious comorbidity and body mass index (BMI) of 50.0 to 59.9 in adult ([place]) 05/25/2019  . Prediabetes 03/20/2019  . HTN (hypertension), benign 02/13/2019    Past Medical History:  Diagnosis Date  . Anxiety   . Back pain   . Depression   . Gestational hypertension   . High blood pressure   . STD (sexually transmitted disease)    Tx'd for chlamydia over 20 years ago    Past Surgical History:  Procedure Laterality Date  . ASPIRATION BIOPSY     fluid around heart- negative results in 2000  . fluid on heart  2000   chest tube placed  . IUD removal under sedation      Current Outpatient Medications  Medication Sig Dispense Refill  . hydrochlorothiazide (HYDRODIURIL) 25 MG tablet Take 25 mg by mouth daily.    . Insulin Pen Needle 32G X 4 MM MISC 1 each by Does not apply route once a week. 50 each 0  . norethindrone (MICRONOR) 0.35 MG tablet Take 1 tablet (0.35 mg total) by mouth daily. 3 tablet 0  . Semaglutide, 1 MG/DOSE,  (OZEMPIC, 1 MG/DOSE,) 4 MG/3ML SOPN Inject 1 mg into the skin once a week. 3 mL 0  . Vitamin D, Ergocalciferol, (DRISDOL) 1.25 MG (50000 UNIT) CAPS capsule Take 1 capsule (50,000 Units total) by mouth every 7 (seven) days. 4 capsule 0   No current facility-administered medications for this visit.     ALLERGIES: Patient has no known allergies.  Family History  Problem Relation Age of Onset  . Diabetes Mother   . High blood pressure Mother   . Cancer Mother   . Depression Mother   . Sleep apnea Mother   . Obesity Mother   . Hypertension Mother   . Hyperlipidemia Mother   . Thyroid disease Mother   . High Cholesterol Father   . Cancer Father   . Alcoholism Father   . Ovarian cancer Sister 56       Dec ovarian Cancer  . Breast cancer Sister 96  . Diabetes Maternal Grandmother   . Stroke Maternal Grandfather     Social History  Socioeconomic History  . Marital status: Married    Spouse name: Marjory Lies  . Number of children: Not on file  . Years of education: Not on file  . Highest education level: Not on file  Occupational History  . Occupation: Education officer, museum  Tobacco Use  . Smoking status: Never Smoker  . Smokeless tobacco: Never Used  Vaping Use  . Vaping Use: Never used  Substance and Sexual Activity  . Alcohol use: No    Comment: 1 drink/month  . Drug use: No  . Sexual activity: Yes    Birth control/protection: Pill  Other Topics Concern  . Not on file  Social History Narrative  . Not on file   Social Determinants of Health   Financial Resource Strain:   . Difficulty of Paying Living Expenses: Not on file  Food Insecurity:   . Worried About Charity fundraiser in the Last Year: Not on file  . Ran Out of Food in the Last Year: Not on file  Transportation Needs:   . Lack of Transportation (Medical): Not on file  . Lack of Transportation (Non-Medical): Not on file  Physical Activity:   . Days of Exercise per Week: Not on file  . Minutes of Exercise per  Session: Not on file  Stress:   . Feeling of Stress : Not on file  Social Connections:   . Frequency of Communication with Friends and Family: Not on file  . Frequency of Social Gatherings with Friends and Family: Not on file  . Attends Religious Services: Not on file  . Active Member of Clubs or Organizations: Not on file  . Attends Archivist Meetings: Not on file  . Marital Status: Not on file  Intimate Partner Violence:   . Fear of Current or Ex-Partner: Not on file  . Emotionally Abused: Not on file  . Physically Abused: Not on file  . Sexually Abused: Not on file    Review of Systems  Constitutional: Negative.   HENT: Negative.   Eyes: Negative.   Respiratory: Negative.   Cardiovascular: Negative.   Gastrointestinal: Negative.   Endocrine: Negative.   Genitourinary: Negative.   Musculoskeletal: Negative.   Skin: Negative.   Allergic/Immunologic: Negative.   Neurological: Negative.   Hematological: Negative.   Psychiatric/Behavioral: Negative.     PHYSICAL EXAMINATION:    BP 116/80 (BP Location: Left Arm, Patient Position: Sitting, Cuff Size: Large)   Pulse 80   Resp 16   Wt (!) 304 lb (137.9 kg)   LMP 04/25/2020 (Exact Date)   BMI 53.85 kg/m     General appearance: alert, cooperative and appears stated age  Pelvic US Uterus with 12 mm intramural fibroid.  EMS 4.02 mm.  Right ovary normal.  Left ovary with 4.2 cm complex hemorrhagic cyst.  No abnormal blood flow.  ?Endometrioma? No free fluid.  ASSESSMENT  Small uterine fibroid.  Hemorrhagic left ovarian cyst.  On Micronor. FH ovarian cancer.   PLAN  Pelvic ultrasound images and report reviewed.  We discussed fibroids and ovarian cysts.  I recommend doing a follow up ultrasound in 6 weeks to check the ovarian cyst.  No CA125 today.  She will see the geneticist for counseling and potential testing.   22 min  total time was spent for this patient encounter, including preparation,  face-to-face counseling with the patient, coordination of care, and documentation of the encounter.

## 2020-05-15 ENCOUNTER — Telehealth (INDEPENDENT_AMBULATORY_CARE_PROVIDER_SITE_OTHER): Payer: BC Managed Care – PPO | Admitting: Psychology

## 2020-05-15 DIAGNOSIS — F3289 Other specified depressive episodes: Secondary | ICD-10-CM

## 2020-05-20 ENCOUNTER — Other Ambulatory Visit: Payer: Self-pay

## 2020-05-20 DIAGNOSIS — N83202 Unspecified ovarian cyst, left side: Secondary | ICD-10-CM

## 2020-05-20 NOTE — Progress Notes (Signed)
PUS orders placed.  Encounter closed

## 2020-05-24 ENCOUNTER — Encounter (HOSPITAL_COMMUNITY): Payer: Self-pay | Admitting: Emergency Medicine

## 2020-05-24 ENCOUNTER — Emergency Department (HOSPITAL_COMMUNITY)
Admission: EM | Admit: 2020-05-24 | Discharge: 2020-05-24 | Disposition: A | Discharge status: Home / Self Care | Payer: BC Managed Care – PPO | Attending: Emergency Medicine | Admitting: Emergency Medicine

## 2020-05-24 ENCOUNTER — Other Ambulatory Visit: Payer: Self-pay

## 2020-05-24 DIAGNOSIS — R1013 Epigastric pain: Secondary | ICD-10-CM | POA: Diagnosis present

## 2020-05-24 DIAGNOSIS — Z5321 Procedure and treatment not carried out due to patient leaving prior to being seen by health care provider: Secondary | ICD-10-CM | POA: Insufficient documentation

## 2020-05-24 DIAGNOSIS — R112 Nausea with vomiting, unspecified: Secondary | ICD-10-CM | POA: Diagnosis not present

## 2020-05-24 LAB — CBC
HCT: 43.8 % (ref 36.0–46.0)
Hemoglobin: 13.9 g/dL (ref 12.0–15.0)
MCH: 26.6 pg (ref 26.0–34.0)
MCHC: 31.7 g/dL (ref 30.0–36.0)
MCV: 83.7 fL (ref 80.0–100.0)
Platelets: 239 10*3/uL (ref 150–400)
RBC: 5.23 MIL/uL — ABNORMAL HIGH (ref 3.87–5.11)
RDW: 13.6 % (ref 11.5–15.5)
WBC: 14.3 10*3/uL — ABNORMAL HIGH (ref 4.0–10.5)
nRBC: 0 % (ref 0.0–0.2)

## 2020-05-24 LAB — I-STAT BETA HCG BLOOD, ED (MC, WL, AP ONLY): I-stat hCG, quantitative: <5 m[IU]/mL (ref <5)

## 2020-05-24 LAB — COMPREHENSIVE METABOLIC PANEL
ALT: 13 U/L (ref 0–44)
AST: 17 U/L (ref 15–41)
Albumin: 3.7 g/dL (ref 3.5–5.0)
Alkaline Phosphatase: 55 U/L (ref 38–126)
Anion gap: 8 (ref 5–15)
BUN: 8 mg/dL (ref 6–20)
CO2: 28 mmol/L (ref 22–32)
Calcium: 9.4 mg/dL (ref 8.9–10.3)
Chloride: 103 mmol/L (ref 98–111)
Creatinine, Ser: 0.91 mg/dL (ref 0.44–1.00)
GFR, Estimated: >60 mL/min (ref >60)
Glucose, Bld: 152 mg/dL — ABNORMAL HIGH (ref 70–99)
Potassium: 4.1 mmol/L (ref 3.5–5.1)
Sodium: 139 mmol/L (ref 135–145)
Total Bilirubin: 0.4 mg/dL (ref 0.3–1.2)
Total Protein: 7.2 g/dL (ref 6.5–8.1)

## 2020-05-24 LAB — LIPASE, BLOOD: Lipase: 39 U/L (ref 11–51)

## 2020-05-24 NOTE — ED Triage Notes (Signed)
Pt c/o epigastric pain with nausea and vomiting after eating at [employer] earlier today.

## 2020-05-24 NOTE — ED Notes (Signed)
Pt states they are leaving  

## 2020-05-28 ENCOUNTER — Other Ambulatory Visit: Payer: Self-pay

## 2020-05-28 ENCOUNTER — Encounter (INDEPENDENT_AMBULATORY_CARE_PROVIDER_SITE_OTHER): Payer: Self-pay | Admitting: Family Medicine

## 2020-05-28 ENCOUNTER — Ambulatory Visit
Admission: RE | Admit: 2020-05-28 | Discharge: 2020-05-28 | Disposition: A | Discharge status: Home / Self Care | Payer: BC Managed Care – PPO | Source: Ambulatory Visit | Attending: Obstetrics and Gynecology | Admitting: Obstetrics and Gynecology

## 2020-05-28 ENCOUNTER — Ambulatory Visit (INDEPENDENT_AMBULATORY_CARE_PROVIDER_SITE_OTHER): Payer: BC Managed Care – PPO | Admitting: Family Medicine

## 2020-05-28 VITALS — BP 126/84 | HR 83 | Temp 98.1°F | Ht 63.0 in | Wt 297.0 lb

## 2020-05-28 DIAGNOSIS — E559 Vitamin D deficiency, unspecified: Secondary | ICD-10-CM

## 2020-05-28 DIAGNOSIS — R7303 Prediabetes: Secondary | ICD-10-CM

## 2020-05-28 DIAGNOSIS — Z6841 Body Mass Index (BMI) 40.0 and over, adult: Secondary | ICD-10-CM

## 2020-05-28 DIAGNOSIS — Z1231 Encounter for screening mammogram for malignant neoplasm of breast: Secondary | ICD-10-CM

## 2020-05-28 DIAGNOSIS — Z9189 Other specified personal risk factors, not elsewhere classified: Secondary | ICD-10-CM | POA: Diagnosis not present

## 2020-05-29 ENCOUNTER — Encounter (INDEPENDENT_AMBULATORY_CARE_PROVIDER_SITE_OTHER): Payer: Self-pay | Admitting: Family Medicine

## 2020-05-29 DIAGNOSIS — E559 Vitamin D deficiency, unspecified: Secondary | ICD-10-CM | POA: Insufficient documentation

## 2020-05-29 LAB — HEMOGLOBIN A1C
Est. average glucose Bld gHb Est-mCnc: 111 mg/dL
Hgb A1c MFr Bld: 5.5 % (ref 4.8–5.6)

## 2020-05-29 LAB — VITAMIN D 25 HYDROXY (VIT D DEFICIENCY, FRACTURES): Vit D, 25-Hydroxy: 29.9 ng/mL — ABNORMAL LOW (ref 30.0–100.0)

## 2020-05-29 LAB — INSULIN, RANDOM: INSULIN: 10.6 u[IU]/mL (ref 2.6–24.9)

## 2020-05-29 MED ORDER — VITAMIN D (ERGOCALCIFEROL) 1.25 MG (50000 UNIT) PO CAPS: 50000.0000 [IU] | ORAL_CAPSULE | ORAL | 0 refills | Status: DC

## 2020-05-29 NOTE — Progress Notes (Signed)
Chief Complaint:   OBESITY Vanessa Wilcox is here to discuss her progress with her obesity treatment plan along with follow-up of her obesity related diagnoses. Vanessa Wilcox is on keeping a food journal and adhering to recommended goals of 1200-1300 calories and 75 grams of protein daily and states she is following her eating plan approximately 70% of the time. Vanessa Wilcox states she is walking for 30-40 minutes 3-4 times per week.  Today's visit was #: 22 Starting weight: 306 lbs Starting date: 02/09/2019 Today's weight: 297 lbs Today's date: 05/28/2020 Total lbs lost to date: 9 Total lbs lost since last in-office visit: 0  Interim History: Vanessa Wilcox is journaling. She is having 1 meal per day and 2 shakes. She is meeting her protein goals. She notes her husband does tend to sabotage her. She is interested in weight loss surgery.  Subjective:   1. Pre-diabetes Vanessa Wilcox is on Ozempic, and last A1c was 5.7. She does not feel Ozempic helps with appetite.   Lab Results  Component Value Date   HGBA1C 5.5 05/28/2020   Lab Results  Component Value Date   INSULIN 10.6 05/28/2020   INSULIN 21.5 02/09/2019   2. Vitamin D deficiency Roux's last Vit D level was low at 7.6. She is on Vit D prescription.  3. At risk for diabetes mellitus Vanessa Wilcox is at higher than average risk for developing diabetes due to obesity and pre-diabetes.   Assessment/Plan:   1. Pre-diabetes  We will check labs today.  - Hemoglobin A1c - Insulin, random  2. Vitamin D deficiency We will check labs today, and we will refill prescription Vitamin D for 1 month. Vanessa Wilcox will follow-up for routine testing of Vitamin D, at least 2-3 times per year to avoid over-replacement.  - VITAMIN D 25 Hydroxy (Vit-D Deficiency, Fractures)  3. At risk for diabetes mellitus Vanessa Wilcox was given approximately 15 minutes of diabetes education and counseling today. We discussed intensive lifestyle modifications today with an emphasis on weight loss  as well as increasing exercise and decreasing simple carbohydrates in her diet. We also reviewed medication options with an emphasis on risk versus benefit of those discussed.   Repetitive spaced learning was employed today to elicit superior memory formation and behavioral change.  4. Class 3 severe obesity with serious comorbidity and body mass index (BMI) of 50.0 to 59.9 in adult, unspecified obesity type [facility]) Vanessa Wilcox is currently in the action stage of change. As such, her goal is to continue with weight loss efforts. She has agreed to keeping a food journal and adhering to recommended goals of 1200-1300 calories and 75 grams of protein daily.   Vanessa Wilcox will work with her husband to decrease availability of tempting foods in the home.   Information was provided to sign up for the bariatric surgery seminar [facility] Surgery seminar). Patient was advised that bariatric surgery is a tool for weight loss and will not be successful long-term without significant dietary changes and exercise.  Exercise goals: As is.  Behavioral modification strategies: increasing lean protein intake, decreasing simple carbohydrates, dealing with family or coworker sabotage and keeping a strict food journal.  Vanessa Wilcox has agreed to follow-up with our clinic in 3 weeks.  Vanessa Wilcox was informed we would discuss her lab results at her next visit unless there is a critical issue that needs to be addressed sooner. Vanessa Wilcox agreed to keep her next visit at the agreed upon time to discuss these results.  Objective:   Blood pressure 126/84, pulse  83, temperature 98.1 F (36.7 C), height 5\' 3"  (1.6 m), weight 297 lb (134.7 kg), SpO2 99 %. Body mass index is 52.61 kg/m.  General: Cooperative, alert, well developed, in no acute distress. HEENT: Conjunctivae and lids unremarkable. Cardiovascular: Regular rhythm.  Lungs: Normal work of breathing. Neurologic: No focal deficits.   Lab Results  Component  Value Date   CREATININE 0.91 05/24/2020   BUN 8 05/24/2020   NA 139 05/24/2020   K 4.1 05/24/2020   CL 103 05/24/2020   CO2 28 05/24/2020   Lab Results  Component Value Date   ALT 13 05/24/2020   AST 17 05/24/2020   ALKPHOS 55 05/24/2020   BILITOT 0.4 05/24/2020   Lab Results  Component Value Date   HGBA1C 5.5 05/28/2020   HGBA1C 5.7 (H) 02/09/2019   Lab Results  Component Value Date   INSULIN 10.6 05/28/2020   INSULIN 21.5 02/09/2019   No results found for: TSH No results found for: CHOL, HDL, LDLCALC, LDLDIRECT, TRIG, CHOLHDL Lab Results  Component Value Date   WBC 14.3 (H) 05/24/2020   HGB 13.9 05/24/2020   HCT 43.8 05/24/2020   MCV 83.7 05/24/2020   PLT 239 05/24/2020   No results found for: IRON, TIBC, FERRITIN  Attestation Statements:   Reviewed by clinician on day of visit: allergies, medications, problem list, medical history, surgical history, family history, social history, and previous encounter notes.   Wilhemena Durie, am acting as Location manager for [employer], FNP-C.  I have reviewed the above documentation for accuracy and completeness, and I agree with the above. -  Georgianne Fick, FNP

## 2020-06-18 ENCOUNTER — Ambulatory Visit (INDEPENDENT_AMBULATORY_CARE_PROVIDER_SITE_OTHER): Payer: BC Managed Care – PPO | Admitting: Family Medicine

## 2020-06-18 ENCOUNTER — Encounter (INDEPENDENT_AMBULATORY_CARE_PROVIDER_SITE_OTHER): Payer: Self-pay | Admitting: Family Medicine

## 2020-06-18 ENCOUNTER — Other Ambulatory Visit: Payer: Self-pay

## 2020-06-18 VITALS — BP 116/76 | HR 84 | Temp 97.3°F | Ht 63.0 in | Wt 299.0 lb

## 2020-06-18 DIAGNOSIS — Z6841 Body Mass Index (BMI) 40.0 and over, adult: Secondary | ICD-10-CM | POA: Diagnosis not present

## 2020-06-18 DIAGNOSIS — E8881 Metabolic syndrome: Secondary | ICD-10-CM

## 2020-06-18 DIAGNOSIS — E559 Vitamin D deficiency, unspecified: Secondary | ICD-10-CM

## 2020-06-19 ENCOUNTER — Encounter (INDEPENDENT_AMBULATORY_CARE_PROVIDER_SITE_OTHER): Payer: Self-pay | Admitting: Family Medicine

## 2020-06-19 DIAGNOSIS — E8881 Metabolic syndrome: Secondary | ICD-10-CM | POA: Insufficient documentation

## 2020-06-19 DIAGNOSIS — E88819 Insulin resistance, unspecified: Secondary | ICD-10-CM | POA: Insufficient documentation

## 2020-06-19 NOTE — Progress Notes (Signed)
Chief Complaint:   OBESITY Vanessa Wilcox is here to discuss her progress with her obesity treatment plan along with follow-up of her obesity related diagnoses. Vanessa Wilcox is on keeping a food journal and adhering to recommended goals of 1200-1300 calories and 75 grams of protein daily and states she is following her eating plan approximately 0% of the time. Vanessa Wilcox states she is walking for 30 minutes 3 times per week.  Today's visit was #: 23 Starting weight: 306 lbs Starting date: 02/09/2019 Today's weight: 299 lbs Today's date: 06/18/2020 Total lbs lost to date: 7 Total lbs lost since last in-office visit: 0  Interim History: Vanessa Wilcox has completed the bariatric surgery seminar, and she is about to work on the packet. She would like to have surgery in the Summer.  We discussed that bariatric surgery is a tool for weight loss and will not be successful long-term without significant dietary changes and exercise. She understand this. She is journaling daily, but not journaling everything. She does not journal her sweets.  Subjective:   1. Insulin resistance Vanessa Wilcox's A1c is now down to 5.5 from 5.7. She notes sweet cravings. I discussed labs with the patient today.  Lab Results  Component Value Date   INSULIN 10.6 05/28/2020   INSULIN 21.5 02/09/2019   Lab Results  Component Value Date   HGBA1C 5.5 05/28/2020   2. Vitamin D deficiency Vanessa Wilcox's last Vit D is up to 29.9 from 7.6. I discussed labs with the patient today.  Assessment/Plan:   1. Insulin resistance She will work on cutting out candy. I advised her to do this "cold [place]" and tyhis will lilley help her sweets cravings after a few days.   2. Vitamin D deficiency . Vanessa Wilcox agreed to continue taking prescription Vitamin D 50,000 IU every week and will follow-up for routine testing of Vitamin D, at least 2-3 times per year to avoid over-replacement.  3. Class 3 severe obesity with serious comorbidity and body mass index (BMI)  of 50.0 to 59.9 in adult, unspecified obesity type [facility]) Vanessa Wilcox is currently in the action stage of change. As such, her goal is to continue with weight loss efforts. She has agreed to keeping a food journal and adhering to recommended goals of 1200-1300 calories and 75 grams of protein daily.   Exercise goals: As is.  Behavioral modification strategies: decreasing simple carbohydrates and planning for success.  [name] has agreed to follow-up with our clinic in 3 weeks.   Objective:   Blood pressure 116/76, pulse 84, temperature (!) 97.3 F (36.3 C), height 5\' 3"  (1.6 m), weight 299 lb (135.6 kg), SpO2 96 %. Body mass index is 52.97 kg/m.  General: Cooperative, alert, well developed, in no acute distress. HEENT: Conjunctivae and lids unremarkable. Cardiovascular: Regular rhythm.  Lungs: Normal work of breathing. Neurologic: No focal deficits.   Lab Results  Component Value Date   CREATININE 0.91 05/24/2020   BUN 8 05/24/2020   NA 139 05/24/2020   K 4.1 05/24/2020   CL 103 05/24/2020   CO2 28 05/24/2020   Lab Results  Component Value Date   ALT 13 05/24/2020   AST 17 05/24/2020   ALKPHOS 55 05/24/2020   BILITOT 0.4 05/24/2020   Lab Results  Component Value Date   HGBA1C 5.5 05/28/2020   HGBA1C 5.7 (H) 02/09/2019   Lab Results  Component Value Date   INSULIN 10.6 05/28/2020   INSULIN 21.5 02/09/2019   No results found for: TSH No results found  for: CHOL, HDL, LDLCALC, LDLDIRECT, TRIG, CHOLHDL Lab Results  Component Value Date   WBC 14.3 (H) 05/24/2020   HGB 13.9 05/24/2020   HCT 43.8 05/24/2020   MCV 83.7 05/24/2020   PLT 239 05/24/2020   No results found for: IRON, TIBC, FERRITIN  Attestation Statements:   Reviewed by clinician on day of visit: allergies, medications, problem list, medical history, surgical history, family history, social history, and previous encounter notes.   Wilhemena Durie, am acting as Location manager for [employer],  FNP-C.  I have reviewed the above documentation for accuracy and completeness, and I agree with the above. -  Georgianne Fick, FNP

## 2020-06-20 ENCOUNTER — Encounter: Payer: Self-pay | Admitting: Genetic Counselor

## 2020-06-20 ENCOUNTER — Other Ambulatory Visit: Payer: Self-pay

## 2020-06-20 ENCOUNTER — Inpatient Hospital Stay: Payer: BC Managed Care – PPO

## 2020-06-20 ENCOUNTER — Inpatient Hospital Stay: Payer: BC Managed Care – PPO | Attending: Genetic Counselor | Admitting: Genetic Counselor

## 2020-06-20 DIAGNOSIS — Z807 Family history of other malignant neoplasms of lymphoid, hematopoietic and related tissues: Secondary | ICD-10-CM

## 2020-06-20 DIAGNOSIS — Z8041 Family history of malignant neoplasm of ovary: Secondary | ICD-10-CM | POA: Diagnosis not present

## 2020-06-20 DIAGNOSIS — Z801 Family history of malignant neoplasm of trachea, bronchus and lung: Secondary | ICD-10-CM

## 2020-06-20 DIAGNOSIS — Z803 Family history of malignant neoplasm of breast: Secondary | ICD-10-CM

## 2020-06-20 NOTE — Progress Notes (Signed)
REFERRING PROVIDER: Nunzio Cobbs, MD [address] [place],  [place] [address]  PRIMARY PROVIDER:  Donald Prose, MD  PRIMARY REASON FOR VISIT:  1. Family history of breast cancer   2. Family history of ovarian cancer   3. Family history of lung cancer   4. Family history of multiple myeloma      HISTORY OF PRESENT ILLNESS:   Vanessa Wilcox, a 39 y.o. female, was seen for a [facility] cancer genetics consultation at the request of Dr. Yisroel Ramming due to a family history of cancer.  Vanessa Wilcox presents to clinic today to discuss the possibility of a hereditary predisposition to cancer, genetic testing, and to further clarify her future cancer risks, as well as potential cancer risks for family members.   Vanessa Wilcox does not have a personal history of cancer.    RISK FACTORS:  Menarche was at age 11.  First live birth at age 80.  OCP use for approximately 6-7 years.  Ovaries intact: yes.  Hysterectomy: no.  Menopausal status: premenopausal.  HRT use: 0 years. Colonoscopy: no. Mammogram within the last year: yes. Number of breast biopsies: 0. Up to date with pelvic exams: yes. Any excessive radiation exposure in the past: no  Past Medical History:  Diagnosis Date  . Anxiety   . Back pain   . Depression   . Family history of lung cancer   . Family history of multiple myeloma   . Gestational hypertension   . High blood pressure   . STD (sexually transmitted disease)    Tx'd for chlamydia over 20 years ago    Past Surgical History:  Procedure Laterality Date  . ASPIRATION BIOPSY     fluid around heart- negative results in 2000  . fluid on heart  2000   chest tube placed  . IUD removal under sedation      Social History   Socioeconomic History  . Marital status: Married    Spouse name: Marjory Lies  . Number of children: Not on file  . Years of education: Not on file  . Highest education level: Not on file  Occupational History  . Occupation:  Education officer, museum  Tobacco Use  . Smoking status: Never Smoker  . Smokeless tobacco: Never Used  Vaping Use  . Vaping Use: Never used  Substance and Sexual Activity  . Alcohol use: No    Comment: 1 drink/month  . Drug use: No  . Sexual activity: Yes    Birth control/protection: Pill  Other Topics Concern  . Not on file  Social History Narrative  . Not on file   Social Determinants of Health   Financial Resource Strain: Not on file  Food Insecurity: Not on file  Transportation Needs: Not on file  Physical Activity: Not on file  Stress: Not on file  Social Connections: Not on file     FAMILY HISTORY:  We obtained a detailed, 4-generation family history.  Significant diagnoses are listed below: Family History  Problem Relation Age of Onset  . Diabetes Mother   . High blood pressure Mother   . Depression Mother   . Sleep apnea Mother   . Obesity Mother   . Hypertension Mother   . Hyperlipidemia Mother   . Thyroid disease Mother   . Lung cancer Mother 15  . High Cholesterol Father   . Cancer Father        unknown type - leg amputated  . Alcoholism  Father   . Ovarian cancer Half-Sister 40       Dec ovarian Cancer  . Breast cancer Half-Sister 56  . Diabetes Maternal Grandmother   . Stroke Maternal Grandfather   . Multiple myeloma Cousin 40       in remission (maternal first cousin)   Vanessa Wilcox has one son (age 43). She has four maternal half-sisters and two maternal half-brothers. One half-sister died this year from ovarian cancer at age 32 (diagnosed age 16). Another half-sister was diagnosed with breast cancer at age 28. Both of these sisters had genetic testing that was reportedly negative.   Vanessa Wilcox mother died at age 58 and had lung cancer diagnosed at age 71. She had three maternal uncles and three maternal aunts. One cousin had myeloma diagnosed at age 28 and is in remission. Her maternal grandmother died in her 25s, and her maternal grandfather died in his 42s.  There are no other known diagnoses of cancer on the maternal side of the family.  Vanessa Wilcox father died at age 49 from cancer - she does not know the type, but it affected his hip and he had his leg amputated. Her father had one full-brother, four full-sisters, three half-brothers, and six half-sisters. Her paternal grandparents died older than 34. Vanessa Wilcox has limited information about the paternal side of the family, but she does not know of any paternal relatives who have had cancer.  Vanessa Wilcox is aware of previous family history of genetic testing for hereditary cancer risks in two of her sisters (reports not available for review today). Patient's maternal and paternal ancestors are of African American descent. There is no reported Ashkenazi Jewish ancestry. There is no known consanguinity.  GENETIC COUNSELING ASSESSMENT: Vanessa Wilcox is a 39 y.o. female with a family history of breast and ovarian cancer and negative genetic testing, which is not strongly suggestive of a hereditary cancer syndrome. We, therefore, discussed and recommended the following at today's visit.   DISCUSSION:  We discussed that, in general, most cancer is not inherited in families, but instead is sporadic or familial. Sporadic cancers occur by chance and typically happen at older ages (>50 years) as this type of cancer is caused by genetic changes acquired during an individual's lifetime. Some families have more cancers than would be expected by chance; however, the ages or types of cancer are not consistent with a known genetic mutation or known genetic mutations have been ruled out. This type of familial cancer is thought to be due to a combination of multiple genetic, environmental, hormonal, and lifestyle factors. While this combination of factors likely increases the risk of cancer, the exact source of this risk is not currently identifiable or testable.    We discussed that approximately 5-10% of cancer is hereditary,  meaning that it is due to a mutation in a single gene that is passed down from generation to generation in a family. Most hereditary cases of breast and ovarian cancer are associated with the BRCA1 and BRCA2 genes. There are other genes that can be associated an increased risk for breast and/or ovarian cancer, including ATM, CHEK2, PALB2, BRIP1, RAD51C, RAD51D, the Lynch syndrome genes, etc. We discussed that identifying a hereditary cancer syndrome can be beneficial for several reasons, including knowing about other cancer risks, identifying potential screening and risk-reduction options that may be appropriate, and to understand if other family members could be at risk for cancer and allow them to undergo genetic testing.   We  reviewed the characteristics, features and inheritance patterns of hereditary cancer syndromes. Because her sisters have reportedly had negative genetic testing, we discussed with Vanessa Wilcox that the family history does not meet insurance or NCCN criteria for genetic testing and, therefore, is not highly consistent with a familial hereditary cancer syndrome. We feel she is at low risk to harbor a gene mutation associated with such a condition. Vanessa Wilcox understands and is still interested in proceeding with genetic testing. We discussed that the self-pay price would be $250, which she unfortunately is not able to afford at this time. We provided the phone number of a genetic testing laboratory, [facility], and encouraged her to call and find out if she would qualify for a lower out of pocket cost based on her household size and income. Otherwise, we remain available to coordinate genetic testing at any point in the future.   Based on the patient's personal and family history, a statistical model Midwife) was used to estimate her risk of developing breast cancer. Tyrer-Cuzick estimates her lifetime risk of developing breast cancer to be approximately 10.7%. This estimation does  not consider any genetic testing results. The patient's lifetime breast cancer risk is a preliminary estimate based on available information using one of several models endorsed by the [place] (ACS). The ACS recommends consideration of breast MRI screening as an adjunct to mammography for patients at high risk (defined as 20% or greater lifetime risk).    PLAN:  Vanessa Wilcox is interested in proceeding with genetic testing, but does not meed medical or insurance criteria for testing and is not able to afford the $250 self-pay price for genetic testing. She will plan to call [facility] to determine whether she may qualify for financial assistance based on her household size and income. We will also reach out to the laboratory to determine if there may be other avenues for financial assistance available to Vanessa Wilcox, and will keep her updated.   We remain available to coordinate genetic testing at any time in the future. We, therefore, recommend Vanessa Wilcox continue to follow the cancer screening guidelines given by her primary healthcare provider.  Vanessa Wilcox questions were answered to her satisfaction today. Our contact information was provided should additional questions or concerns arise. Thank you for the referral and allowing [name] to share in the care of your patient.   Clint Guy, Index, [facility] Licensed, Certified Dispensing [email or web address] Phone: [phone]  The patient was seen for a total of 55 minutes in face-to-face genetic counseling.  This patient was discussed with Drs. Magrinat, Lindi Adie and/or Burr Medico who agrees with the above.    _______________________________________________________________________ For Office Staff:  Number of people involved in session: 1 Was an Intern/ student involved with case: no

## 2020-06-21 ENCOUNTER — Telehealth: Payer: Self-pay | Admitting: Genetic Counselor

## 2020-06-21 NOTE — Telephone Encounter (Signed)
LVM that the genetic testing laboratory [facility]) is able to lower the self-pay price to $100 for genetic testing. Requested that she call back to discuss in more detail.

## 2020-06-25 ENCOUNTER — Ambulatory Visit: Payer: BC Managed Care – PPO | Admitting: Obstetrics and Gynecology

## 2020-06-25 ENCOUNTER — Ambulatory Visit (INDEPENDENT_AMBULATORY_CARE_PROVIDER_SITE_OTHER): Payer: BC Managed Care – PPO

## 2020-06-25 ENCOUNTER — Encounter: Payer: Self-pay | Admitting: Obstetrics and Gynecology

## 2020-06-25 ENCOUNTER — Other Ambulatory Visit: Payer: Self-pay | Admitting: Obstetrics and Gynecology

## 2020-06-25 ENCOUNTER — Other Ambulatory Visit: Payer: Self-pay

## 2020-06-25 VITALS — BP 128/76 | HR 72 | Resp 18 | Ht 63.0 in | Wt 299.0 lb

## 2020-06-25 DIAGNOSIS — Z8041 Family history of malignant neoplasm of ovary: Secondary | ICD-10-CM

## 2020-06-25 DIAGNOSIS — N83202 Unspecified ovarian cyst, left side: Secondary | ICD-10-CM

## 2020-06-25 DIAGNOSIS — Z3041 Encounter for surveillance of contraceptive pills: Secondary | ICD-10-CM | POA: Diagnosis not present

## 2020-06-25 MED ORDER — NORETHINDRONE 0.35 MG PO TABS
1.0000 | ORAL_TABLET | Freq: Every day | ORAL | 3 refills | Status: DC
Start: 2020-06-25 — End: 2020-07-31

## 2020-06-25 NOTE — Progress Notes (Signed)
GYNECOLOGY  VISIT   HPI: 39 y.o.   Married  [place] American  female   [phone] with Patient's last menstrual period was 06/17/2020.   here for pelvic ultrasound.     Has a left ovarian cyst, 4.2 cm and hemorrhagic.    FH ovarian cancer.   Patient had genetic counseling done.  She did not do genetic testing due to cost concerns.  She did not meat criteria for this genetic study but she has interest to do this in the future.  She is taking Micronor for 2 months.  Menses are regular and look regular to her.  No problems with taking pills regularly.   GYNECOLOGIC HISTORY: Patient's last menstrual period was 06/17/2020. Contraception:  Micronor Menopausal hormone therapy:  none Last mammogram:  05/28/20 BIRADS 1 negative/density b Last pap smear:   07/18/19 Neg:Neg HR HPV        OB History    Gravida  3   Para  1   Term      Preterm      AB  2   Living  1     SAB      IAB  2   Ectopic      Multiple      Live Births                 Patient Active Problem List   Diagnosis Date Noted  . Family history of lung cancer   . Family history of multiple myeloma   . Insulin resistance 06/19/2020  . Vitamin D deficiency 05/29/2020  . Family history of ovarian cancer 05/06/2020  . Family history of breast cancer 05/06/2020  . Class 3 severe obesity with serious comorbidity and body mass index (BMI) of 50.0 to 59.9 in adult ([place]) 05/25/2019  . Prediabetes 03/20/2019  . HTN (hypertension), benign 02/13/2019    Past Medical History:  Diagnosis Date  . Anxiety   . Back pain   . Depression   . Family history of lung cancer   . Family history of multiple myeloma   . Gestational hypertension   . High blood pressure   . STD (sexually transmitted disease)    Tx'd for chlamydia over 20 years ago    Past Surgical History:  Procedure Laterality Date  . ASPIRATION BIOPSY     fluid around heart- negative results in 2000  . fluid on heart  2000   chest tube placed   . IUD removal under sedation      Current Outpatient Medications  Medication Sig Dispense Refill  . hydrochlorothiazide (HYDRODIURIL) 25 MG tablet Take 25 mg by mouth daily.    . Insulin Pen Needle 32G X 4 MM MISC 1 each by Does not apply route once a week. 50 each 0  . norethindrone (MICRONOR) 0.35 MG tablet Take 1 tablet (0.35 mg total) by mouth daily. 3 tablet 0  . Semaglutide, 1 MG/DOSE, (OZEMPIC, 1 MG/DOSE,) 4 MG/3ML SOPN Inject 1 mg into the skin once a week. 3 mL 0  . Vitamin D, Ergocalciferol, (DRISDOL) 1.25 MG (50000 UNIT) CAPS capsule Take 1 capsule (50,000 Units total) by mouth every 7 (seven) days. 4 capsule 0   No current facility-administered medications for this visit.     ALLERGIES: Patient has no known allergies.  Family History  Problem Relation Age of Onset  . Diabetes Mother   . High blood pressure Mother   . Depression Mother   . Sleep apnea Mother   .  Obesity Mother   . Hypertension Mother   . Hyperlipidemia Mother   . Thyroid disease Mother   . Lung cancer Mother 65  . High Cholesterol Father   . Cancer Father        unknown type - leg amputated  . Alcoholism Father   . Ovarian cancer Half-Sister 56       Dec ovarian Cancer  . Breast cancer Half-Sister 48  . Diabetes Maternal Grandmother   . Stroke Maternal Grandfather   . Multiple myeloma Cousin 40       in remission (maternal first cousin)    Social History   Socioeconomic History  . Marital status: Married    Spouse name: Marjory Lies  . Number of children: Not on file  . Years of education: Not on file  . Highest education level: Not on file  Occupational History  . Occupation: Education officer, museum  Tobacco Use  . Smoking status: Never Smoker  . Smokeless tobacco: Never Used  Vaping Use  . Vaping Use: Never used  Substance and Sexual Activity  . Alcohol use: No    Comment: 1 drink/month  . Drug use: No  . Sexual activity: Yes    Birth control/protection: Pill  Other Topics Concern  . Not  on file  Social History Narrative  . Not on file   Social Determinants of Health   Financial Resource Strain: Not on file  Food Insecurity: Not on file  Transportation Needs: Not on file  Physical Activity: Not on file  Stress: Not on file  Social Connections: Not on file  Intimate Partner Violence: Not on file    Review of Systems  Constitutional: Negative.   HENT: Negative.   Eyes: Negative.   Respiratory: Negative.   Cardiovascular: Negative.   Gastrointestinal: Negative.   Endocrine: Negative.   Genitourinary: Negative.   Musculoskeletal: Negative.   Skin: Negative.   Allergic/Immunologic: Negative.   Neurological: Negative.   Hematological: Negative.   Psychiatric/Behavioral: Negative.     PHYSICAL EXAMINATION:    BP 128/76 (BP Location: Right Arm, Patient Position: Sitting, Cuff Size: Large)   Pulse 72   Resp 18   Ht '5\' 3"'  (1.6 m)   Wt 299 lb (135.6 kg)   LMP 06/17/2020   BMI 52.97 kg/m     General appearance: alert, cooperative and appears stated age  Pelvic US Uterus with fibroid, stable. EMS 3.84 mm.  Ovaries normal.  No free fluid.  ASSESSMENT  Left ovarian cyst, resolved.  FH ovarian and breast cancer in different half sisters.  Genetic counseling completed.  No testing done.  Lifetime risk of breast cancer 10.7% Uterine fibroid. Medication monitoring.  Doing well on Micronor.   PLAN  Pelvic US images and report reviewed with patient.  Reassurance regarding ultrasound findings and resolution of ovarian cyst.  Continue Micronor.  Yearly mammogram. FU for annual exam and prn.    22 min  total time was spent for this patient encounter, including preparation, face-to-face counseling with the patient, coordination of care, and documentation of the encounter.

## 2020-07-09 ENCOUNTER — Ambulatory Visit (INDEPENDENT_AMBULATORY_CARE_PROVIDER_SITE_OTHER): Payer: BC Managed Care – PPO | Admitting: Family Medicine

## 2020-07-11 ENCOUNTER — Other Ambulatory Visit (INDEPENDENT_AMBULATORY_CARE_PROVIDER_SITE_OTHER): Payer: Self-pay | Admitting: Family Medicine

## 2020-07-11 DIAGNOSIS — E559 Vitamin D deficiency, unspecified: Secondary | ICD-10-CM

## 2020-07-11 NOTE — Telephone Encounter (Signed)
Dawn [employer]

## 2020-07-31 ENCOUNTER — Encounter (INDEPENDENT_AMBULATORY_CARE_PROVIDER_SITE_OTHER): Payer: Self-pay | Admitting: Family Medicine

## 2020-07-31 ENCOUNTER — Other Ambulatory Visit: Payer: Self-pay

## 2020-07-31 ENCOUNTER — Ambulatory Visit (INDEPENDENT_AMBULATORY_CARE_PROVIDER_SITE_OTHER): Payer: BC Managed Care – PPO | Admitting: Family Medicine

## 2020-07-31 VITALS — BP 126/87 | HR 86 | Temp 98.0°F | Ht 63.0 in | Wt 299.0 lb

## 2020-07-31 DIAGNOSIS — Z9189 Other specified personal risk factors, not elsewhere classified: Secondary | ICD-10-CM | POA: Diagnosis not present

## 2020-07-31 DIAGNOSIS — E8881 Metabolic syndrome: Secondary | ICD-10-CM | POA: Diagnosis not present

## 2020-07-31 DIAGNOSIS — Z6841 Body Mass Index (BMI) 40.0 and over, adult: Secondary | ICD-10-CM

## 2020-07-31 DIAGNOSIS — F3289 Other specified depressive episodes: Secondary | ICD-10-CM | POA: Diagnosis not present

## 2020-07-31 DIAGNOSIS — E559 Vitamin D deficiency, unspecified: Secondary | ICD-10-CM

## 2020-08-01 MED ORDER — VITAMIN D (ERGOCALCIFEROL) 1.25 MG (50000 UNIT) PO CAPS: 50000.0000 [IU] | ORAL_CAPSULE | ORAL | 0 refills | Status: DC

## 2020-08-01 MED ORDER — OZEMPIC (0.25 OR 0.5 MG/DOSE) 2 MG/1.5ML ~~LOC~~ SOPN: PEN_INJECTOR | SUBCUTANEOUS | 0 refills | Status: DC

## 2020-08-05 NOTE — Progress Notes (Signed)
Chief Complaint:   OBESITY Vanessa Wilcox is here to discuss her progress with her obesity treatment plan along with follow-up of her obesity related diagnoses.   Today's visit was #: 24 Starting weight: 306 lbs Starting date: 02/09/2019 Today's weight: 299 lbs Today's date: 07/31/2020 Total lbs lost to date: 7 lbs Body mass index is 52.97 kg/m.  Total weight loss percentage to date: -2.29%  Interim History: Vanessa Wilcox had an appointment with her PCP to discuss bariatric surgery referral, but it was canceled due to snow.  Unfortunately, the next available appointment is in May. Nutrition Plan: keeping a food journal and adhering to recommended goals of 1200-1300 calories and 75 grams of protein.  Anti-obesity medications: Ozempic 0.25 mg subcutaneously weekly. Reported side effects: None. Activity: Walking 8,000 steps per day 3 times per week.  Assessment/Plan:   1. Metabolic syndrome Starting goal: Lose 7-10% of starting weight. She will continue to focus on protein-rich, low simple carbohydrate foods. We reviewed the importance of hydration, regular exercise for stress reduction, and restorative sleep.  We will continue to check lab work every 3 months, with 10% weight loss, or should any other concerns arise.  Plan:  Start Ozempic 0.25 mg subcutaneously weekly for 2 weeks, then 0.5 mg subcutaneously weekly for 2 weeks, then 1 mg subcutaneously.  2. Vitamin D deficiency Not at goal. Current vitamin D is 29.9, tested on 05/28/2020. Optimal goal > 50 ng/dL.   Plan:  [x]   Continue Vitamin D @50 ,000 IU every week. []   Continue home supplement daily. [x]   Follow-up for routine testing of Vitamin D at least 2-3 times per year to avoid over-replacement.  - Refill Vitamin D, Ergocalciferol, (DRISDOL) 1.25 MG (50000 UNIT) CAPS capsule; Take 1 capsule (50,000 Units total) by mouth every 7 (seven) days.  Dispense: 4 capsule; Refill: 0  3. Other depression, with emotional eating Behavior  modification techniques were discussed today to help Vanessa Wilcox deal with her emotional/non-hunger eating behaviors.  Orders and follow up as documented in patient record.   4. At risk for heart disease Due to Vanessa Wilcox's current state of health and medical condition(s), she is at a higher risk for heart disease.   This puts the patient at much greater risk to subsequently develop cardiopulmonary conditions that can significantly affect patient's quality of life in a negative manner as well.    At least 10 minutes was spent on counseling Vanessa Wilcox about these concerns today. Initial goal is to lose at least 5-10% of starting weight to help reduce these risk factors.  We will continue to reassess these conditions on a fairly regular basis in an attempt to decrease patient's overall morbidity and mortality.  Evidence-based interventions for health behavior change were utilized today including the discussion of self monitoring techniques, problem-solving barriers and SMART goal setting techniques.  Specifically regarding patient's less desirable eating habits and patterns, we employed the technique of small changes when Janaiya has not been able to fully commit to her prudent nutritional plan.  5. Class 3 severe obesity with serious comorbidity and body mass index (BMI) of 50.0 to 59.9 in adult, unspecified obesity type [facility])  - Ambulatory referral to General Surgery  I will write letter.  Course: Vanessa Wilcox is currently in the action stage of change. As such, her goal is to continue with weight loss efforts.   Nutrition goals: She has agreed to keeping a food journal and adhering to recommended goals of 1200-1300 calories and 75 grams of protein.  Exercise goals: For substantial health benefits, adults should do at least 150 minutes (2 hours and 30 minutes) a week of moderate-intensity, or 75 minutes (1 hour and 15 minutes) a week of vigorous-intensity aerobic physical activity, or an equivalent combination of  moderate- and vigorous-intensity aerobic activity. Aerobic activity should be performed in episodes of at least 10 minutes, and preferably, it should be spread throughout the week.  Behavioral modification strategies: increasing lean protein intake, decreasing simple carbohydrates, increasing vegetables, increasing water intake and decreasing liquid calories.  Hyla has agreed to follow-up with our clinic in 2 weeks. She was informed of the importance of frequent follow-up visits to maximize her success with intensive lifestyle modifications for her multiple health conditions.   Objective:   Blood pressure 126/87, pulse 86, temperature 98 F (36.7 C), temperature source Oral, height 5\' 3"  (1.6 m), weight 299 lb (135.6 kg), SpO2 99 %. Body mass index is 52.97 kg/m.  General: Cooperative, alert, well developed, in no acute distress. HEENT: Conjunctivae and lids unremarkable. Cardiovascular: Regular rhythm.  Lungs: Normal work of breathing. Neurologic: No focal deficits.   Lab Results  Component Value Date   CREATININE 0.91 05/24/2020   BUN 8 05/24/2020   NA 139 05/24/2020   K 4.1 05/24/2020   CL 103 05/24/2020   CO2 28 05/24/2020   Lab Results  Component Value Date   ALT 13 05/24/2020   AST 17 05/24/2020   ALKPHOS 55 05/24/2020   BILITOT 0.4 05/24/2020   Lab Results  Component Value Date   HGBA1C 5.5 05/28/2020   HGBA1C 5.7 (H) 02/09/2019   Lab Results  Component Value Date   INSULIN 10.6 05/28/2020   INSULIN 21.5 02/09/2019   Lab Results  Component Value Date   WBC 14.3 (H) 05/24/2020   HGB 13.9 05/24/2020   HCT 43.8 05/24/2020   MCV 83.7 05/24/2020   PLT 239 05/24/2020   Attestation Statements:   Reviewed by clinician on day of visit: allergies, medications, problem list, medical history, surgical history, family history, social history, and previous encounter notes.  I, Water quality scientist, CMA, am acting as transcriptionist for Briscoe Deutscher, DO  I have reviewed  the above documentation for accuracy and completeness, and I agree with the above. Briscoe Deutscher, DO

## 2020-08-12 ENCOUNTER — Encounter (INDEPENDENT_AMBULATORY_CARE_PROVIDER_SITE_OTHER): Payer: Self-pay | Admitting: Family Medicine

## 2020-08-12 ENCOUNTER — Ambulatory Visit (INDEPENDENT_AMBULATORY_CARE_PROVIDER_SITE_OTHER): Payer: BC Managed Care – PPO | Admitting: Family Medicine

## 2020-08-12 ENCOUNTER — Other Ambulatory Visit: Payer: Self-pay

## 2020-08-12 VITALS — BP 132/80 | HR 111 | Temp 98.1°F | Ht 63.0 in | Wt 298.0 lb

## 2020-08-12 DIAGNOSIS — E8881 Metabolic syndrome: Secondary | ICD-10-CM

## 2020-08-12 DIAGNOSIS — F3289 Other specified depressive episodes: Secondary | ICD-10-CM

## 2020-08-12 DIAGNOSIS — Z6841 Body Mass Index (BMI) 40.0 and over, adult: Secondary | ICD-10-CM | POA: Diagnosis not present

## 2020-08-13 ENCOUNTER — Encounter (INDEPENDENT_AMBULATORY_CARE_PROVIDER_SITE_OTHER): Payer: Self-pay | Admitting: Family Medicine

## 2020-08-13 DIAGNOSIS — F32A Depression, unspecified: Secondary | ICD-10-CM | POA: Insufficient documentation

## 2020-08-13 DIAGNOSIS — E8881 Metabolic syndrome: Secondary | ICD-10-CM | POA: Insufficient documentation

## 2020-08-13 NOTE — Progress Notes (Signed)
Chief Complaint:   OBESITY Vanessa Wilcox is here to discuss her progress with her obesity treatment plan along with follow-up of her obesity related diagnoses. Vanessa Wilcox is on keeping a food journal and adhering to recommended goals of 1200-1300 calories and 75 grams of protein daily and states she is following her eating plan approximately 35-40% of the time. Vanessa Wilcox states she is walking 10,000 steps.   Today's visit was #: 25 Starting weight: 306 lbs Starting date: 02/09/2019 Today's weight: 298 lbs Today's date: 08/12/2020 Total lbs lost to date: 8 Total lbs lost since last in-office visit: 1  Interim History: Vanessa Wilcox is in the process for approval for bariatric surgery. She is working on her packet. She ahs not had an OV with a surgeon yet. She feels she has 3 good days when her hunger is well controlled after taking Ozempic, but then her hunger returns. She is journaling daily and her protein averages  60 grams daily. She forgets to take her lunch to school and then skips lunch.  Subjective:   1. Metabolic syndrome Vanessa Wilcox was started on Ozempic and she has taken 0.25 mg for 2 weeks. She notes her appetite increased toward the end of the week. She will start 0.5 mg at her next dose. She denies nausea or constipation.  2. Other depression, with emotional eating Vanessa Wilcox notes sweet cravings. She is avoiding sweets however.  Assessment/Plan:   1. Metabolic syndrome Vanessa Wilcox agreed to start Ozempic 0.5 mg next week.  May send prior authorizations for [facility] again when needed since coverage tier for state plan has changed.  2. Other depression, with emotional eating  We discussed bupropion and we will consider in the future.   3. Class 3 severe obesity with serious comorbidity and body mass index (BMI) of 50.0 to 59.9 in adult, unspecified obesity type [facility]) Vanessa Wilcox is currently in the action stage of change. As such, her goal is to continue with weight loss efforts. She has agreed to keeping a  food journal and adhering to recommended goals of 1200-1300 calories and 75 grams of protein daily.   We discussed strategies to reduce skipping meals.  Exercise goals: As is.  Behavioral modification strategies: increasing lean protein intake and no skipping meals.  Vanessa Wilcox has agreed to follow-up with our clinic in 2 weeks with Dr. [name].   Objective:   Blood pressure 132/80, pulse (!) 111, temperature 98.1 F (36.7 C), height 5\' 3"  (1.6 m), weight 298 lb (135.2 kg), SpO2 99 %. Body mass index is 52.79 kg/m.  General: Cooperative, alert, well developed, in no acute distress. HEENT: Conjunctivae and lids unremarkable. Cardiovascular: Regular rhythm.  Lungs: Normal work of breathing. Neurologic: No focal deficits.   Lab Results  Component Value Date   CREATININE 0.91 05/24/2020   BUN 8 05/24/2020   NA 139 05/24/2020   K 4.1 05/24/2020   CL 103 05/24/2020   CO2 28 05/24/2020   Lab Results  Component Value Date   ALT 13 05/24/2020   AST 17 05/24/2020   ALKPHOS 55 05/24/2020   BILITOT 0.4 05/24/2020   Lab Results  Component Value Date   HGBA1C 5.5 05/28/2020   HGBA1C 5.7 (H) 02/09/2019   Lab Results  Component Value Date   INSULIN 10.6 05/28/2020   INSULIN 21.5 02/09/2019   No results found for: TSH No results found for: CHOL, HDL, LDLCALC, LDLDIRECT, TRIG, CHOLHDL Lab Results  Component Value Date   WBC 14.3 (H) 05/24/2020   HGB 13.9  05/24/2020   HCT 43.8 05/24/2020   MCV 83.7 05/24/2020   PLT 239 05/24/2020   No results found for: IRON, TIBC, FERRITIN  Attestation Statements:   Reviewed by clinician on day of visit: allergies, medications, problem list, medical history, surgical history, family history, social history, and previous encounter notes.   Wilhemena Durie, am acting as Location manager for [employer], FNP-C.  I have reviewed the above documentation for accuracy and completeness, and I agree with the above. -  Georgianne Fick,  FNP

## 2020-08-28 ENCOUNTER — Encounter (INDEPENDENT_AMBULATORY_CARE_PROVIDER_SITE_OTHER): Payer: Self-pay | Admitting: Family Medicine

## 2020-08-28 ENCOUNTER — Other Ambulatory Visit: Payer: Self-pay

## 2020-08-28 ENCOUNTER — Ambulatory Visit (INDEPENDENT_AMBULATORY_CARE_PROVIDER_SITE_OTHER): Payer: BC Managed Care – PPO | Admitting: Family Medicine

## 2020-08-28 VITALS — BP 115/84 | HR 94 | Temp 98.2°F | Ht 63.0 in | Wt 300.0 lb

## 2020-08-28 DIAGNOSIS — E8881 Metabolic syndrome: Secondary | ICD-10-CM

## 2020-08-28 DIAGNOSIS — Z6841 Body Mass Index (BMI) 40.0 and over, adult: Secondary | ICD-10-CM

## 2020-08-28 DIAGNOSIS — Z9189 Other specified personal risk factors, not elsewhere classified: Secondary | ICD-10-CM | POA: Diagnosis not present

## 2020-08-28 DIAGNOSIS — E559 Vitamin D deficiency, unspecified: Secondary | ICD-10-CM

## 2020-08-28 DIAGNOSIS — F5081 Binge eating disorder: Secondary | ICD-10-CM

## 2020-08-29 ENCOUNTER — Encounter (INDEPENDENT_AMBULATORY_CARE_PROVIDER_SITE_OTHER): Payer: Self-pay | Admitting: Family Medicine

## 2020-08-29 MED ORDER — LISDEXAMFETAMINE DIMESYLATE 20 MG PO CAPS: 20.0000 mg | ORAL_CAPSULE | Freq: Every day | ORAL | 0 refills | Status: DC

## 2020-08-29 MED ORDER — VITAMIN D (ERGOCALCIFEROL) 1.25 MG (50000 UNIT) PO CAPS: 50000.0000 [IU] | ORAL_CAPSULE | ORAL | 0 refills | Status: DC

## 2020-08-29 NOTE — Progress Notes (Signed)
Chief Complaint:   OBESITY Vanessa Wilcox is here to discuss her progress with her obesity treatment plan along with follow-up of her obesity related diagnoses.   Today's visit was #: 26 Starting weight: 306 lbs Starting date: 02/09/2019 Today's weight: 300 lbs Today's date: 08/28/2020 Total lbs lost to date: 6 lbs Body mass index is 53.14 kg/m.  Total weight loss percentage to date: -1.96%  Interim History:  Vanessa Wilcox says she is pursuing bariatric surgery. Nutrition Plan: keeping a food journal and adhering to recommended goals of 1200-1300 calories and 75 grams of protein daily for 60% of the time. Activity: Walking 9,000 steps 5 times per week.  Assessment/Plan:   1. Vitamin D deficiency Not at goal. Current vitamin D is 29.9, tested on 05/28/2020. Optimal goal > 50 ng/dL.   Plan: Continue to take prescription Vitamin D @50 ,000 IU every week as prescribed.  Follow-up for routine testing of Vitamin D, at least 2-3 times per year to avoid over-replacement.  - Refill Vitamin D, Ergocalciferol, (DRISDOL) 1.25 MG (50000 UNIT) CAPS capsule; Take 1 capsule (50,000 Units total) by mouth every 7 (seven) days.  Dispense: 4 capsule; Refill: 0  2. Metabolic syndrome Starting goal: Lose 7-10% of starting weight. She will continue to focus on protein-rich, low simple carbohydrate foods. We reviewed the importance of hydration, regular exercise for stress reduction, and restorative sleep.  We will continue to check lab work every 3 months, with 10% weight loss, or should any other concerns arise.  3. Binge eating disorder Vanessa Wilcox meets binge eating disorder (BED) requirements, including: binging 4-13 times a week , eating until feeling uncomfortably full, eating large amounts of food when not feeling physically full and depressed or guilty about binge eating.   People who binge eat feel as if they don't have control over how much they eat and have feelings of guilt or self-loathing after a binge  eating episode. Vanessa Wilcox estimates that about 30 percent of adults with binge eating disorder also have a history of ADHD. The FDA has approved Vyvanse as a treatment option for both ADHD and binge eating. Vyvanse targets the brain's reward center by increasing the levels of dopamine and norepinephrine, the chemicals of the brain responsible for feelings of pleasure. Mindful eating is the recommended nutritional approach to treating BED.   I have consulted the [place] Controlled Substances Registry for this patient, and feel the risk/benefit ratio today is favorable for proceeding with this prescription for a controlled substance. The patient understands monitoring parameters and red flags.   - Start  lisdexamfetamine (VYVANSE) 20 MG capsule; Take 1 capsule (20 mg total) by mouth daily.  Dispense: 30 capsule; Refill: 0  4. At risk for heart disease Due to Vanessa Wilcox current state of health and medical condition(s), she is at a higher risk for heart disease.  This puts the patient at much greater risk to subsequently develop cardiopulmonary conditions that can significantly affect patient's quality of life in a negative manner.    At least 10 minutes were spent on counseling Vanessa Wilcox about these concerns today, and I stressed the importance of reversing risks factors of obesity, especially truncal and visceral fat, hypertension, hyperlipidemia, and pre-diabetes.  The initial goal is to lose at least 5-10% of starting weight to help reduce these risk factors.  Counseling:  Intensive lifestyle modifications were discussed with Vanessa Wilcox as the most appropriate first line of treatment.  she will continue to work on diet, exercise, and weight loss efforts.  We will continue to reassess these conditions on a fairly regular basis in an attempt to decrease the patient's overall morbidity and mortality.  Evidence-based interventions for health behavior change were utilized today including the discussion of self  monitoring techniques, problem-solving barriers, and SMART goal setting techniques.  Specifically, regarding patient's less desirable eating habits and patterns, we employed the technique of small changes when Vanessa Wilcox has not been able to fully commit to her prudent nutritional plan.  5. Class 3 severe obesity with serious comorbidity and body mass index (BMI) of 50.0 to 59.9 in adult, unspecified obesity type [facility])  Course: Vanessa Wilcox is currently in the action stage of change. As such, her goal is to continue with weight loss efforts.   Nutrition goals: She has agreed to keeping a food journal and adhering to recommended goals of 1300 calories and 95 grams of protein.   Exercise goals: For substantial health benefits, adults should do at least 150 minutes (2 hours and 30 minutes) a week of moderate-intensity, or 75 minutes (1 hour and 15 minutes) a week of vigorous-intensity aerobic physical activity, or an equivalent combination of moderate- and vigorous-intensity aerobic activity. Aerobic activity should be performed in episodes of at least 10 minutes, and preferably, it should be spread throughout the week.  Behavioral modification strategies: increasing lean protein intake, decreasing simple carbohydrates, increasing vegetables and increasing water intake.  Vanessa Wilcox has agreed to follow-up with our clinic in 4 weeks. She was informed of the importance of frequent follow-up visits to maximize her success with intensive lifestyle modifications for her multiple health conditions.   Objective:   Blood pressure 115/84, pulse 94, temperature 98.2 F (36.8 C), temperature source Oral, height 5\' 3"  (1.6 m), weight 300 lb (136.1 kg), SpO2 94 %. Body mass index is 53.14 kg/m.  General: Cooperative, alert, well developed, in no acute distress. HEENT: Conjunctivae and lids unremarkable. Cardiovascular: Regular rhythm.  Lungs: Normal work of breathing. Neurologic: No focal deficits.   Lab Results   Component Value Date   CREATININE 0.91 05/24/2020   BUN 8 05/24/2020   NA 139 05/24/2020   K 4.1 05/24/2020   CL 103 05/24/2020   CO2 28 05/24/2020   Lab Results  Component Value Date   ALT 13 05/24/2020   AST 17 05/24/2020   ALKPHOS 55 05/24/2020   BILITOT 0.4 05/24/2020   Lab Results  Component Value Date   HGBA1C 5.5 05/28/2020   HGBA1C 5.7 (H) 02/09/2019   Lab Results  Component Value Date   INSULIN 10.6 05/28/2020   INSULIN 21.5 02/09/2019   Lab Results  Component Value Date   WBC 14.3 (H) 05/24/2020   HGB 13.9 05/24/2020   HCT 43.8 05/24/2020   MCV 83.7 05/24/2020   PLT 239 05/24/2020   Attestation Statements:   Reviewed by clinician on day of visit: allergies, medications, problem list, medical history, surgical history, family history, social history, and previous encounter notes.  I, Water quality scientist, CMA, am acting as transcriptionist for Briscoe Deutscher, DO  I have reviewed the above documentation for accuracy and completeness, and I agree with the above. Briscoe Deutscher, DO

## 2020-09-03 ENCOUNTER — Encounter (INDEPENDENT_AMBULATORY_CARE_PROVIDER_SITE_OTHER): Payer: Self-pay

## 2020-09-09 ENCOUNTER — Encounter (INDEPENDENT_AMBULATORY_CARE_PROVIDER_SITE_OTHER): Payer: Self-pay | Admitting: Family Medicine

## 2020-09-09 ENCOUNTER — Ambulatory Visit (INDEPENDENT_AMBULATORY_CARE_PROVIDER_SITE_OTHER): Payer: BC Managed Care – PPO | Admitting: Family Medicine

## 2020-09-09 ENCOUNTER — Other Ambulatory Visit: Payer: Self-pay

## 2020-09-09 VITALS — BP 129/84 | HR 108 | Temp 98.1°F | Ht 63.0 in | Wt 297.0 lb

## 2020-09-09 DIAGNOSIS — Z9189 Other specified personal risk factors, not elsewhere classified: Secondary | ICD-10-CM | POA: Diagnosis not present

## 2020-09-09 DIAGNOSIS — F5081 Binge eating disorder: Secondary | ICD-10-CM

## 2020-09-09 DIAGNOSIS — E8881 Metabolic syndrome: Secondary | ICD-10-CM

## 2020-09-09 DIAGNOSIS — Z6841 Body Mass Index (BMI) 40.0 and over, adult: Secondary | ICD-10-CM | POA: Diagnosis not present

## 2020-09-09 MED ORDER — OZEMPIC (1 MG/DOSE) 2 MG/1.5ML ~~LOC~~ SOPN: 1.0000 mg | PEN_INJECTOR | SUBCUTANEOUS | 0 refills | Status: DC

## 2020-09-10 ENCOUNTER — Encounter (INDEPENDENT_AMBULATORY_CARE_PROVIDER_SITE_OTHER): Payer: Self-pay | Admitting: Family Medicine

## 2020-09-10 NOTE — Progress Notes (Signed)
Chief Complaint:   OBESITY Vanessa Wilcox is here to discuss her progress with her obesity treatment plan along with follow-up of her obesity related diagnoses. Vanessa Wilcox is on keeping a food journal and adhering to recommended goals of 1300 calories and 95 g protein and states she is following her eating plan approximately 85% of the time. Vanessa Wilcox states she is walking at school.  Today's visit was #: 5 Starting weight: 306 lbs Starting date: 02/09/2019 Today's weight: 297 lbs Today's date: 09/09/2020 Total lbs lost to date: 9 lbs Total lbs lost since last in-office visit: 3 lbs  Interim History: Vanessa Wilcox is having 2 shakes per day and a frozen meal with added chicken breast for supper. She is having 4 snacks a day: cheese sticks, tuna, eggs, nuts. She is meeting protein goal (95 g/day). Her calories are running 1100-1300. She is still working on her bariatric surgery packet.  Subjective:   1. Binge eating disorder Vanessa Wilcox is on Vyvanse 20 mg daily for binge eating disorder. She started it yesterday. She did sleep last night but feels very "wired" overall. She would like to continue for now. Her BP is within normal limits. She denies insomnia.   2. Metabolic syndrome Vanessa Wilcox denies polyphagia. She is now on Ozempic 1 mg. She denies nausea or constipation.   3. At risk for side effect of medication Vanessa Wilcox is at risk for side effects of medication due to being on Vyvanse and Ozempic.  Assessment/Plan:   1. Binge eating disorder Continue Vyvanse for now.  2. Metabolic syndrome Refill Ozempic 1 mg, as per below.  - Semaglutide, 1 MG/DOSE, (OZEMPIC, 1 MG/DOSE,) 2 MG/1.5ML SOPN; Inject 1 mg into the skin once a week.  Dispense: 3 mL; Refill: 0  3. At risk for side effect of medication Vanessa Wilcox was given approximately 15 minutes of drug side effect counseling today.  We discussed side effect possibility and risk versus benefits. Vanessa Wilcox agreed to the medication and will contact this office if these  side effects are intolerable.  Repetitive spaced learning was employed today to elicit superior memory formation and behavioral change.  4. Class 3 severe obesity with serious comorbidity and body mass index (BMI) of 50.0 to 59.9 in adult, unspecified obesity type [facility]) Vanessa Wilcox is currently in the action stage of change. As such, her goal is to continue with weight loss efforts. She has agreed to keeping a food journal and adhering to recommended goals of 1300 calories and 95 g protein.   Exercise goals: As is  Behavioral modification strategies: increasing vegetables, increasing water intake and keeping a strict food journal.  Vanessa Wilcox has agreed to follow-up with our clinic in 2 weeks with [facility] and 4 weeks with Dr. [name].    Objective:   Blood pressure 129/84, pulse (!) 108, temperature 98.1 F (36.7 C), height 5\' 3"  (1.6 m), weight 297 lb (134.7 kg), SpO2 98 %. Body mass index is 52.61 kg/m.  General: Cooperative, alert, well developed, in no acute distress. HEENT: Conjunctivae and lids unremarkable. Cardiovascular: Regular rhythm.  Lungs: Normal work of breathing. Neurologic: No focal deficits.   Lab Results  Component Value Date   CREATININE 0.91 05/24/2020   BUN 8 05/24/2020   NA 139 05/24/2020   K 4.1 05/24/2020   CL 103 05/24/2020   CO2 28 05/24/2020   Lab Results  Component Value Date   ALT 13 05/24/2020   AST 17 05/24/2020   ALKPHOS 55 05/24/2020   BILITOT 0.4 05/24/2020  Lab Results  Component Value Date   HGBA1C 5.5 05/28/2020   HGBA1C 5.7 (H) 02/09/2019   Lab Results  Component Value Date   INSULIN 10.6 05/28/2020   INSULIN 21.5 02/09/2019   No results found for: TSH No results found for: CHOL, HDL, LDLCALC, LDLDIRECT, TRIG, CHOLHDL Lab Results  Component Value Date   WBC 14.3 (H) 05/24/2020   HGB 13.9 05/24/2020   HCT 43.8 05/24/2020   MCV 83.7 05/24/2020   PLT 239 05/24/2020     Attestation Statements:   Reviewed by clinician on day of  visit: allergies, medications, problem list, medical history, surgical history, family history, social history, and previous encounter notes.  Coral Ceo, am acting as Location manager for [employer], [place].  I have reviewed the above documentation for accuracy and completeness, and I agree with the above. -  Georgianne Fick, FNP

## 2020-09-23 ENCOUNTER — Other Ambulatory Visit: Payer: Self-pay

## 2020-09-23 ENCOUNTER — Ambulatory Visit (INDEPENDENT_AMBULATORY_CARE_PROVIDER_SITE_OTHER): Payer: BC Managed Care – PPO | Admitting: Family Medicine

## 2020-09-23 ENCOUNTER — Encounter (INDEPENDENT_AMBULATORY_CARE_PROVIDER_SITE_OTHER): Payer: Self-pay | Admitting: Family Medicine

## 2020-09-23 VITALS — BP 127/84 | HR 71 | Temp 98.0°F | Ht 63.0 in | Wt 292.0 lb

## 2020-09-23 DIAGNOSIS — Z9189 Other specified personal risk factors, not elsewhere classified: Secondary | ICD-10-CM | POA: Diagnosis not present

## 2020-09-23 DIAGNOSIS — Z6841 Body Mass Index (BMI) 40.0 and over, adult: Secondary | ICD-10-CM

## 2020-09-23 DIAGNOSIS — K5900 Constipation, unspecified: Secondary | ICD-10-CM

## 2020-09-23 DIAGNOSIS — F5081 Binge eating disorder: Secondary | ICD-10-CM

## 2020-09-24 NOTE — Progress Notes (Signed)
Chief Complaint:   OBESITY Vanessa Wilcox is here to discuss her progress with her obesity treatment plan along with follow-up of her obesity related diagnoses. Vanessa Wilcox is on keeping a food journal and adhering to recommended goals of 1300 calories and 95 grams of protein daily and states she is following her eating plan approximately 90% of the time. Vanessa Wilcox states she is walking for 30 minutes 2 times per week.  Today's visit was #: 28 Starting weight: 306 lbs Starting date: 02/09/2019 Today's weight: 292 lbs Today's date: 09/23/2020 Total lbs lost to date: 14 Total lbs lost since last in-office visit: 5  Interim History: Vanessa Wilcox has 2 shakes and one meal per day. She averages 80 grams of protein per day, and 1300 calories per day. Her appetite is well controlled with Ozempic and Vyvanse. She is tracking her intake consistently.  Subjective:   1. Binge eating disorder Vanessa Wilcox's Vyvanse does help control her appetite. It also helps her to focus  and get more tasks done. She denies binge eating. She only takes Vyvanse Monday thru Friday. Her blood pressure is well controlled and she denies insomnia.  2. Constipation, unspecified constipation type Vanessa Wilcox notes constipation with increased protein.  3. At risk for side effect of medication Vanessa Wilcox is at risk for drug side effects due to Vyvanse (insomnia and elevated blood pressure), and Ozempic (nausea).  Assessment/Plan:   1. Binge eating disorder Vanessa Wilcox will continue Vyvanse Monday thru Friday, and will continue to follow up as directed.  2. Constipation, unspecified constipation type Vanessa Wilcox may take miralax as needed (up to daily).    3. At risk for side effect of medication Vanessa Wilcox was given approximately 15 minutes of drug side effect counseling today.  We discussed side effect possibility and risk versus benefits. Vanessa Wilcox agreed to the medication and will contact this office if these side effects are intolerable.  Repetitive spaced  learning was employed today to elicit superior memory formation and behavioral change.  4. Class 3 severe obesity with serious comorbidity and body mass index (BMI) of 50.0 to 59.9 in adult, unspecified obesity type Vanessa Wilcox) Vanessa Wilcox is currently in the action stage of change. As such, her goal is to continue with weight loss efforts. She has agreed to keeping a food journal and adhering to recommended goals of 1300 calories and 95 grams of protein daily.   Vanessa Wilcox is to have one shake per day and have a frozen meal for lunch.  Exercise goals: As is.  Behavioral modification strategies: increasing lean protein intake, increasing vegetables and increasing water intake.  Vanessa Wilcox has agreed to follow-up with our clinic in 2 weeks with Dr. [name], and in 4 weeks with myself.    Objective:   Blood pressure 127/84, pulse 71, temperature 98 F (36.7 C), height 5\' 3"  (1.6 m), weight 292 lb (132.5 kg), SpO2 96 %. Body mass index is 51.73 kg/m.  General: Cooperative, alert, well developed, in no acute distress. HEENT: Conjunctivae and lids unremarkable. Cardiovascular: Regular rhythm.  Lungs: Normal work of breathing. Neurologic: No focal deficits.   Lab Results  Component Value Date   CREATININE 0.91 05/24/2020   BUN 8 05/24/2020   NA 139 05/24/2020   K 4.1 05/24/2020   CL 103 05/24/2020   CO2 28 05/24/2020   Lab Results  Component Value Date   ALT 13 05/24/2020   AST 17 05/24/2020   ALKPHOS 55 05/24/2020   BILITOT 0.4 05/24/2020   Lab Results  Component Value Date  HGBA1C 5.5 05/28/2020   HGBA1C 5.7 (H) 02/09/2019   Lab Results  Component Value Date   INSULIN 10.6 05/28/2020   INSULIN 21.5 02/09/2019   No results found for: TSH No results found for: CHOL, HDL, LDLCALC, LDLDIRECT, TRIG, CHOLHDL Lab Results  Component Value Date   WBC 14.3 (H) 05/24/2020   HGB 13.9 05/24/2020   HCT 43.8 05/24/2020   MCV 83.7 05/24/2020   PLT 239 05/24/2020   No results found for: IRON,  TIBC, FERRITIN  Attestation Statements:   Reviewed by clinician on day of visit: allergies, medications, problem list, medical history, surgical history, family history, social history, and previous encounter notes.   Wilhemena Durie, am acting as Location manager for [employer], FNP-C.  I have reviewed the above documentation for accuracy and completeness, and I agree with the above. -  Georgianne Fick, FNP

## 2020-09-25 ENCOUNTER — Encounter (INDEPENDENT_AMBULATORY_CARE_PROVIDER_SITE_OTHER): Payer: Self-pay | Admitting: Family Medicine

## 2020-09-25 DIAGNOSIS — F5081 Binge eating disorder: Secondary | ICD-10-CM | POA: Insufficient documentation

## 2020-09-25 DIAGNOSIS — F50819 Binge eating disorder, unspecified: Secondary | ICD-10-CM | POA: Insufficient documentation

## 2020-10-07 ENCOUNTER — Encounter (INDEPENDENT_AMBULATORY_CARE_PROVIDER_SITE_OTHER): Payer: Self-pay | Admitting: Family Medicine

## 2020-10-07 ENCOUNTER — Ambulatory Visit (INDEPENDENT_AMBULATORY_CARE_PROVIDER_SITE_OTHER): Payer: BC Managed Care – PPO | Admitting: Family Medicine

## 2020-10-07 ENCOUNTER — Other Ambulatory Visit: Payer: Self-pay

## 2020-10-07 VITALS — BP 123/84 | HR 86 | Temp 98.4°F | Ht 63.0 in | Wt 290.0 lb

## 2020-10-07 DIAGNOSIS — F5081 Binge eating disorder: Secondary | ICD-10-CM

## 2020-10-07 DIAGNOSIS — F50819 Binge eating disorder, unspecified: Secondary | ICD-10-CM

## 2020-10-07 DIAGNOSIS — N644 Mastodynia: Secondary | ICD-10-CM

## 2020-10-07 DIAGNOSIS — Z6841 Body Mass Index (BMI) 40.0 and over, adult: Secondary | ICD-10-CM

## 2020-10-07 DIAGNOSIS — E559 Vitamin D deficiency, unspecified: Secondary | ICD-10-CM

## 2020-10-07 DIAGNOSIS — Z9189 Other specified personal risk factors, not elsewhere classified: Secondary | ICD-10-CM | POA: Diagnosis not present

## 2020-10-08 MED ORDER — VITAMIN D (ERGOCALCIFEROL) 1.25 MG (50000 UNIT) PO CAPS: 50000.0000 [IU] | ORAL_CAPSULE | ORAL | 0 refills | Status: DC

## 2020-10-08 MED ORDER — LISDEXAMFETAMINE DIMESYLATE 20 MG PO CAPS: 20.0000 mg | ORAL_CAPSULE | Freq: Every day | ORAL | 0 refills | Status: DC

## 2020-10-08 NOTE — Progress Notes (Signed)
Chief Complaint:   OBESITY Vanessa Wilcox is here to discuss her progress with her obesity treatment plan along with follow-up of her obesity related diagnoses.   Today's visit was #: 28 Starting weight: 306 lbs Starting date: 02/09/2019 Today's weight: 290 lbs Today's date: 10/07/2020 Total lbs lost to date: 16 lbs Body mass index is 51.37 kg/m.  Total weight loss percentage to date: -5.23%  Interim History: Vanessa Wilcox is pursuing bariatric surgery. She has brought in her paperwork for [name] to fill out and fax to [facility]. Her personal statement "I want to have surgery to be healthier and to relieve weight off of my chest to improve my difficuly with breathing" will be typed into a letter and sent with her paperwork.  Current Meal Plan: keeping a food journal and adhering to recommended goals of 1300 calories and 95 protein for 80 % of the time.  Current Exercise Plan: Walking 10,000 steps 5 times a week. Current Anti-Obesity Medications: Ozempic and Vyvanse. Side effects: None.  Assessment/Plan:   1. Vitamin D deficiency Improving, but not optimized. Current vitamin D is 39.9 ng/mL, tested on 05/28/2020. Optimal goal > 50 ng/dL.   Plan: Continue to take prescription Vitamin D @50 ,000 IU every week as prescribed.  Follow-up for routine testing of Vitamin D, at least 2-3 times per year to avoid over-replacement.   - Refill Vitamin D, Ergocalciferol, (DRISDOL) 1.25 MG (50000 UNIT) CAPS capsule; Take 1 capsule (50,000 Units total) by mouth every 7 (seven) days.  Dispense: 4 capsule; Refill: 0  2. Mastalgia With pendulous breast. We will continue to monitor symptoms as they relate to her weight loss journey.  3. Binge eating disorder Vanessa Wilcox meets binge eating disorder (BED) requirements, including: binging 4-13 times a week , eating until feeling uncomfortably full, eating large amounts of food when not feeling physically full and depressed or guilty about binge eating.    People who binge eat feel as if they don't have control over how much they eat and have feelings of guilt or self-loathing after a binge eating episode. [place] estimates that about 30 percent of adults with binge eating disorder also have a history of ADHD. The FDA has approved Vyvanse as a treatment option for both ADHD and binge eating. Vyvanse targets the brain's reward center by increasing the levels of dopamine and norepinephrine, the chemicals of the brain responsible for feelings of pleasure. Mindful eating is the recommended nutritional approach to treating BED.   - Refill lisdexamfetamine (VYVANSE) 20 MG capsule; Take 1 capsule (20 mg total) by mouth daily.  Dispense: 30 capsule; Refill: 0  4. At risk for heart disease Due to Vanessa Wilcox's current state of health and medical condition(s), she is at a higher risk for heart disease.  This puts the patient at much greater risk to subsequently develop cardiopulmonary conditions that can significantly affect patient's quality of life in a negative manner.    At least 15 minutes were spent on counseling Duru about these concerns today. Evidence-based interventions for health behavior change were utilized today including the discussion of self monitoring techniques, problem-solving barriers, and SMART goal setting techniques.  Specifically, regarding patient's less desirable eating habits and patterns, we employed the technique of small changes when Vanessa Wilcox has not been able to fully commit to her prudent nutritional plan.  5. Obesity, current BMI 51.4  Course: Vanessa Wilcox is currently in the action stage of change. As such, her goal is to continue with weight loss efforts.  Nutrition goals: She has agreed to keeping a food journal and adhering to recommended goals of 1300 calories and 95 protein.   Exercise goals: As is.  Behavioral modification strategies: increasing lean protein intake, decreasing simple carbohydrates, increasing  vegetables, increasing water intake and no skipping meals.  Vanessa Wilcox has agreed to follow-up with our clinic in 4 weeks. She was informed of the importance of frequent follow-up visits to maximize her success with intensive lifestyle modifications for her multiple health conditions.   Objective:   Blood pressure 123/84, pulse 86, temperature 98.4 F (36.9 C), temperature source Oral, height 5\' 3"  (1.6 m), weight 290 lb (131.5 kg), last menstrual period 10/05/2020, SpO2 97 %. Body mass index is 51.37 kg/m.  General: Cooperative, alert, well developed, in no acute distress. HEENT: Conjunctivae and lids unremarkable. Cardiovascular: Regular rhythm.  Lungs: Normal work of breathing. Neurologic: No focal deficits.   Attestation Statements:   Reviewed by clinician on day of visit: allergies, medications, problem list, medical history, surgical history, family history, social history, and previous encounter notes.  Leodis Binet Friedenbach, CMA, am acting as Location manager for [employer], DO.  I have reviewed the above documentation for accuracy and completeness, and I agree with the above. Briscoe Deutscher, DO

## 2020-10-17 ENCOUNTER — Encounter (INDEPENDENT_AMBULATORY_CARE_PROVIDER_SITE_OTHER): Payer: Self-pay | Admitting: Family Medicine

## 2020-10-17 DIAGNOSIS — E8881 Metabolic syndrome: Secondary | ICD-10-CM

## 2020-10-21 MED ORDER — OZEMPIC (1 MG/DOSE) 2 MG/1.5ML ~~LOC~~ SOPN: 1.0000 mg | PEN_INJECTOR | SUBCUTANEOUS | 0 refills | Status: DC

## 2020-10-21 NOTE — Telephone Encounter (Signed)
Pt last seen by Dr. Wallace.  

## 2020-11-05 ENCOUNTER — Ambulatory Visit (INDEPENDENT_AMBULATORY_CARE_PROVIDER_SITE_OTHER): Payer: BC Managed Care – PPO | Admitting: Family Medicine

## 2020-11-07 ENCOUNTER — Ambulatory Visit (INDEPENDENT_AMBULATORY_CARE_PROVIDER_SITE_OTHER): Payer: BC Managed Care – PPO | Admitting: Family Medicine

## 2020-11-07 ENCOUNTER — Encounter (INDEPENDENT_AMBULATORY_CARE_PROVIDER_SITE_OTHER): Payer: Self-pay | Admitting: Family Medicine

## 2020-11-07 ENCOUNTER — Other Ambulatory Visit: Payer: Self-pay

## 2020-11-07 VITALS — BP 127/86 | HR 91 | Temp 98.6°F | Ht 63.0 in | Wt 290.0 lb

## 2020-11-07 DIAGNOSIS — E559 Vitamin D deficiency, unspecified: Secondary | ICD-10-CM

## 2020-11-07 DIAGNOSIS — Z9189 Other specified personal risk factors, not elsewhere classified: Secondary | ICD-10-CM

## 2020-11-07 DIAGNOSIS — F5081 Binge eating disorder: Secondary | ICD-10-CM

## 2020-11-07 DIAGNOSIS — E8881 Metabolic syndrome: Secondary | ICD-10-CM | POA: Diagnosis not present

## 2020-11-07 DIAGNOSIS — Z6841 Body Mass Index (BMI) 40.0 and over, adult: Secondary | ICD-10-CM

## 2020-11-07 MED ORDER — OZEMPIC (2 MG/DOSE) 8 MG/3ML ~~LOC~~ SOPN: 2.0000 mg | PEN_INJECTOR | SUBCUTANEOUS | 0 refills | Status: DC

## 2020-11-07 MED ORDER — VITAMIN D (ERGOCALCIFEROL) 1.25 MG (50000 UNIT) PO CAPS: 50000.0000 [IU] | ORAL_CAPSULE | ORAL | 0 refills | Status: DC

## 2020-11-11 ENCOUNTER — Encounter (INDEPENDENT_AMBULATORY_CARE_PROVIDER_SITE_OTHER): Payer: Self-pay | Admitting: Family Medicine

## 2020-11-11 NOTE — Progress Notes (Signed)
Chief Complaint:   OBESITY Vanessa Wilcox is here to discuss her progress with her obesity treatment plan along with follow-up of her obesity related diagnoses. Vanessa Wilcox is on keeping a food journal and adhering to recommended goals of 1300 calories and 95 grams of protein daily and states she is following her eating plan approximately 20% of the time. Vanessa Wilcox states she is walking for 30 minutes 2 times per week.  Today's visit was #: 6 Starting weight: 306 lbs Starting date: 02/09/2019 Today's weight: 290 lbs Today's date: 11/07/2020 Total lbs lost to date: 16 Total lbs lost since last in-office visit: 0  Interim History: Vanessa Wilcox has an appointment with bariatric surgeon at [place] on May 11th.  She has not been able to focus on the plan as well the last few weeks due to school and family issues. She has mainatined her weight today.  Subjective:   1. Vitamin D deficiency Vanessa Wilcox's last Vit D level was not at goal at 98.3.   2. Metabolic syndrome Vanessa Wilcox is on Ozempic 1 mg, and her appetite is well controlled.  3. Binge eating disorder Vanessa Wilcox notes good control of appetite and binge type eating.  4. At risk for side effect of medication Vanessa Wilcox is at risk for drug side effects due to increased dose of Ozempic.  Assessment/Plan:   1. Vitamin D deficiency  We will refill prescription Vitamin D for 1 month. Vanessa Wilcox will follow-up for routine testing of Vitamin D, at least 2-3 times per year to avoid over-replacement.  - Vitamin D, Ergocalciferol, (DRISDOL) 1.25 MG (50000 UNIT) CAPS capsule; Take 1 capsule (50,000 Units total) by mouth every 7 (seven) days.  Dispense: 4 capsule; Refill: 0  2. Metabolic syndrome Vanessa Wilcox agreed to increase Ozempic to 2.0 mg weekly, and we will refill for 1 month. Vanessa Wilcox will continue to follow up as directed.  - Semaglutide, 2 MG/DOSE, (OZEMPIC, 2 MG/DOSE,) 8 MG/3ML SOPN; Inject 2 mg into the skin once a week.  Dispense: 3 mL; Refill: 0  3. Binge eating  disorder Vanessa Wilcox will continue Vyvanse at 20 mg q daily, and will continue to follow up as directed.  4. At risk for side effect of medication Vanessa Wilcox was given approximately 15 minutes of drug side effect counseling today.  We discussed side effect possibility and risk versus benefits. Vanessa Wilcox agreed to the medication and will contact this office if these side effects are intolerable.  Repetitive spaced learning was employed today to elicit superior memory formation and behavioral change.  5. Obesity, current BMI 51.4 Vanessa Wilcox is currently in the action stage of change. As such, her goal is to continue with weight loss efforts. She has agreed to keeping a food journal and adhering to recommended goals of 1300 calories and 95 grams of protein daily.   Exercise goals: As is.  Behavioral modification strategies: increasing lean protein intake and increasing water intake.  Vanessa Wilcox has agreed to follow-up with our clinic in 6 to 7 weeks.   Objective:   Blood pressure 127/86, pulse 91, temperature 98.6 F (37 C), height 5\' 3"  (1.6 m), weight 290 lb (131.5 kg), SpO2 99 %. Body mass index is 51.37 kg/m.  General: Cooperative, alert, well developed, in no acute distress. HEENT: Conjunctivae and lids unremarkable. Cardiovascular: Regular rhythm.  Lungs: Normal work of breathing. Neurologic: No focal deficits.   Lab Results  Component Value Date   CREATININE 0.91 05/24/2020   BUN 8 05/24/2020   NA 139 05/24/2020   K 4.1  05/24/2020   CL 103 05/24/2020   CO2 28 05/24/2020   Lab Results  Component Value Date   ALT 13 05/24/2020   AST 17 05/24/2020   ALKPHOS 55 05/24/2020   BILITOT 0.4 05/24/2020   Lab Results  Component Value Date   HGBA1C 5.5 05/28/2020   HGBA1C 5.7 (H) 02/09/2019   Lab Results  Component Value Date   INSULIN 10.6 05/28/2020   INSULIN 21.5 02/09/2019   No results found for: TSH No results found for: CHOL, HDL, LDLCALC, LDLDIRECT, TRIG, CHOLHDL Lab Results   Component Value Date   WBC 14.3 (H) 05/24/2020   HGB 13.9 05/24/2020   HCT 43.8 05/24/2020   MCV 83.7 05/24/2020   PLT 239 05/24/2020   No results found for: IRON, TIBC, FERRITIN  Attestation Statements:   Reviewed by clinician on day of visit: allergies, medications, problem list, medical history, surgical history, family history, social history, and previous encounter notes.   Wilhemena Durie, am acting as Location manager for [employer], FNP-C.  I have reviewed the above documentation for accuracy and completeness, and I agree with the above. -  Georgianne Fick, FNP

## 2020-11-13 ENCOUNTER — Encounter (INDEPENDENT_AMBULATORY_CARE_PROVIDER_SITE_OTHER): Payer: Self-pay

## 2020-11-21 ENCOUNTER — Other Ambulatory Visit: Payer: Self-pay | Admitting: Surgery

## 2020-11-21 ENCOUNTER — Other Ambulatory Visit (HOSPITAL_COMMUNITY): Payer: Self-pay | Admitting: Surgery

## 2020-11-22 ENCOUNTER — Other Ambulatory Visit (HOSPITAL_COMMUNITY): Payer: Self-pay | Admitting: Surgery

## 2020-11-26 ENCOUNTER — Ambulatory Visit (HOSPITAL_COMMUNITY)
Admission: RE | Admit: 2020-11-26 | Discharge: 2020-11-26 | Disposition: A | Discharge status: Home / Self Care | Payer: BC Managed Care – PPO | Source: Ambulatory Visit | Attending: Surgery | Admitting: Surgery

## 2020-11-26 ENCOUNTER — Other Ambulatory Visit (HOSPITAL_COMMUNITY): Payer: Self-pay

## 2020-11-26 ENCOUNTER — Other Ambulatory Visit: Payer: Self-pay

## 2020-12-03 ENCOUNTER — Ambulatory Visit (INDEPENDENT_AMBULATORY_CARE_PROVIDER_SITE_OTHER): Payer: BC Managed Care – PPO | Admitting: Family Medicine

## 2020-12-03 ENCOUNTER — Encounter (INDEPENDENT_AMBULATORY_CARE_PROVIDER_SITE_OTHER): Payer: Self-pay | Admitting: Family Medicine

## 2020-12-03 ENCOUNTER — Other Ambulatory Visit: Payer: Self-pay

## 2020-12-03 VITALS — BP 128/84 | HR 84 | Temp 98.2°F | Ht 63.0 in | Wt 288.0 lb

## 2020-12-03 DIAGNOSIS — E559 Vitamin D deficiency, unspecified: Secondary | ICD-10-CM

## 2020-12-03 DIAGNOSIS — Z9189 Other specified personal risk factors, not elsewhere classified: Secondary | ICD-10-CM

## 2020-12-03 DIAGNOSIS — E8881 Metabolic syndrome: Secondary | ICD-10-CM

## 2020-12-03 DIAGNOSIS — F5081 Binge eating disorder: Secondary | ICD-10-CM | POA: Diagnosis not present

## 2020-12-03 DIAGNOSIS — Z9884 Bariatric surgery status: Secondary | ICD-10-CM | POA: Diagnosis not present

## 2020-12-03 DIAGNOSIS — Z6841 Body Mass Index (BMI) 40.0 and over, adult: Secondary | ICD-10-CM

## 2020-12-04 ENCOUNTER — Ambulatory Visit (HOSPITAL_COMMUNITY)
Admission: RE | Admit: 2020-12-04 | Discharge: 2020-12-04 | Disposition: A | Discharge status: Home / Self Care | Payer: BC Managed Care – PPO | Source: Ambulatory Visit | Attending: Surgery | Admitting: Surgery

## 2020-12-04 MED ORDER — OZEMPIC (1 MG/DOSE) 4 MG/3ML ~~LOC~~ SOPN: 1.0000 mg | PEN_INJECTOR | SUBCUTANEOUS | 0 refills | Status: DC

## 2020-12-11 NOTE — Progress Notes (Signed)
Chief Complaint:   OBESITY Vanessa Wilcox is here to discuss her progress with her obesity treatment plan along with follow-up of her obesity related diagnoses.   Today's visit was #: 57 Starting weight: 306 lbs Starting date: 02/09/2019 Today's weight: 288 lbs Today's date: 12/03/2020 Weight change since last visit: 2 lbs Total lbs lost to date: 18 lbs Body mass index is 51.02 kg/m.  Total weight loss percentage to date: -5.88%  Interim History:  Vanessa Wilcox is scheduled for a televisit with Psychiatry on June 16, and with Sleep Medicine on June 14.  She will be seeing the dietician on June 30.  She will be having an xray tomorrow.  She wants bariatric surgery at the end of the year.    Current Meal Plan: keeping a food journal and adhering to recommended goals of 1300 calories and 95 grams of protein for 60% of the time.  Current Exercise Plan: Walking for 30-60 minutes 4-5 times per week. Current Anti-Obesity Medications: Ozempic 2 mg subcutaneously weekly. Side effects: None.  Assessment/Plan:   Meds ordered this encounter  Medications   Semaglutide, 1 MG/DOSE, (OZEMPIC, 1 MG/DOSE,) 4 MG/3ML SOPN    Sig: Inject 1 mg into the skin once a week.    Dispense:  3 mL    Refill:  0    1. Metabolic syndrome Starting goal: Lose 7-10% of starting weight. She will continue to focus on protein-rich, low simple carbohydrate foods. We reviewed the importance of hydration, regular exercise for stress reduction, and restorative sleep.  We will continue to check lab work every 3 months, with 10% weight loss, or should any other concerns arise.  - Refill Semaglutide, 1 MG/DOSE, (OZEMPIC, 1 MG/DOSE,) 4 MG/3ML SOPN; Inject 1 mg (36 clicks) into the skin once a week.  Dispense: 3 mL; Refill: 0  2. Vitamin D deficiency Not at goal. Current vitamin D is 39.9, tested on 05/28/2020. Optimal goal > 50 ng/dL.  She is taking vitamin D 50,000 IU weekly.  Plan: Continue to take prescription Vitamin D @50 ,000 IU  every week as prescribed.  Follow-up for routine testing of Vitamin D, at least 2-3 times per year to avoid over-replacement.  3. Bariatric surgery status, on track Vanessa Wilcox is in the process of fulfilling all of the requirements in order to have bariatric surgery.  She would like to have surgery by the end of the year.  4. Binge eating disorder Avarose meets binge eating disorder (BED) requirements, including: binging 4-13 times a week, eating until feeling uncomfortably full, eating large amounts of food when not feeling physically full and depressed or guilty about binge eating.  She is taking Vyvanse 20 mg daily. Plan:  The current medical regimen is effective;  continue present plan and medications.  People who binge eat feel as if they don't have control over how much they eat and have feelings of guilt or self-loathing after a binge eating episode. [place] estimates that about 30 percent of adults with binge eating disorder also have a history of ADHD. The FDA has approved Vyvanse as a treatment option for both ADHD and binge eating. Vyvanse targets the brain's reward center by increasing the levels of dopamine and norepinephrine, the chemicals of the brain responsible for feelings of pleasure. Mindful eating is the recommended nutritional approach to treating BED.   5. At risk for heart disease Due to Vanessa Wilcox's current state of health and medical condition(s), she is at a higher risk for heart disease.  This puts the patient at much greater risk to subsequently develop cardiopulmonary conditions that can significantly affect patient's quality of life in a negative manner.    At least 8 minutes were spent on counseling [name] about these concerns today. Evidence-based interventions for health behavior change were utilized today including the discussion of self monitoring techniques, problem-solving barriers, and SMART goal setting techniques.  Specifically, regarding patient's less desirable  eating habits and patterns, we employed the technique of small changes when [name] has not been able to fully commit to her prudent nutritional plan.  6. Obesity, current BMI 51.1  Course: Vanessa Wilcox is currently in the action stage of change. As such, her goal is to continue with weight loss efforts.   Nutrition goals: She has agreed to keeping a food journal and adhering to recommended goals of 1300 calories and 95 grams of protein.   Exercise goals: For substantial health benefits, adults should do at least 150 minutes (2 hours and 30 minutes) a week of moderate-intensity, or 75 minutes (1 hour and 15 minutes) a week of vigorous-intensity aerobic physical activity, or an equivalent combination of moderate- and vigorous-intensity aerobic activity. Aerobic activity should be performed in episodes of at least 10 minutes, and preferably, it should be spread throughout the week.  Behavioral modification strategies: increasing lean protein intake, decreasing simple carbohydrates, increasing vegetables and increasing water intake.  [name] has agreed to follow-up with our clinic in 4 weeks. She was informed of the importance of frequent follow-up visits to maximize her success with intensive lifestyle modifications for her multiple health conditions.   Objective:   Blood pressure 128/84, pulse 84, temperature 98.2 F (36.8 C), temperature source Oral, height 5\' 3"  (1.6 m), weight 288 lb (130.6 kg), SpO2 96 %. Body mass index is 51.02 kg/m.  General: Cooperative, alert, well developed, in no acute distress. HEENT: Conjunctivae and lids unremarkable. Cardiovascular: Regular rhythm.  Lungs: Normal work of breathing. Neurologic: No focal deficits.   Lab Results  Component Value Date   CREATININE 0.91 05/24/2020   BUN 8 05/24/2020   NA 139 05/24/2020   K 4.1 05/24/2020   CL 103 05/24/2020   CO2 28 05/24/2020   Lab Results  Component Value Date   ALT 13 05/24/2020   AST 17 05/24/2020    ALKPHOS 55 05/24/2020   BILITOT 0.4 05/24/2020   Lab Results  Component Value Date   HGBA1C 5.5 05/28/2020   HGBA1C 5.7 (H) 02/09/2019   Lab Results  Component Value Date   INSULIN 10.6 05/28/2020   INSULIN 21.5 02/09/2019   Lab Results  Component Value Date   WBC 14.3 (H) 05/24/2020   HGB 13.9 05/24/2020   HCT 43.8 05/24/2020   MCV 83.7 05/24/2020   PLT 239 05/24/2020   Attestation Statements:   Reviewed by clinician on day of visit: allergies, medications, problem list, medical history, surgical history, family history, social history, and previous encounter notes.  I, Water quality scientist, CMA, am acting as transcriptionist for Briscoe Deutscher, DO  I have reviewed the above documentation for accuracy and completeness, and I agree with the above. Briscoe Deutscher, DO

## 2020-12-16 ENCOUNTER — Encounter: Payer: Self-pay | Admitting: Neurology

## 2020-12-17 ENCOUNTER — Ambulatory Visit: Payer: BC Managed Care – PPO | Admitting: Neurology

## 2020-12-17 ENCOUNTER — Encounter: Payer: Self-pay | Admitting: Neurology

## 2020-12-17 VITALS — BP 142/84 | HR 84 | Ht 62.5 in | Wt 297.0 lb

## 2020-12-17 DIAGNOSIS — R7303 Prediabetes: Secondary | ICD-10-CM | POA: Diagnosis not present

## 2020-12-17 DIAGNOSIS — R0601 Orthopnea: Secondary | ICD-10-CM | POA: Diagnosis not present

## 2020-12-17 DIAGNOSIS — Z6841 Body Mass Index (BMI) 40.0 and over, adult: Secondary | ICD-10-CM

## 2020-12-17 DIAGNOSIS — R0683 Snoring: Secondary | ICD-10-CM | POA: Diagnosis not present

## 2020-12-17 NOTE — Patient Instructions (Signed)
Screening for Sleep Apnea  Sleep apnea is a condition in which breathing pauses or becomes shallow during sleep. Sleep apnea screening is a test to determine if you are at risk for sleep apnea. The test includes a series of questions. It will only takes a few minutes. Your health care provider may ask you to have this test in preparationfor surgery or as part of a physical exam. What are the symptoms of sleep apnea? Common symptoms of sleep apnea include: Snoring. Waking up often at night. Daytime sleepiness. Pauses in breathing. Choking or gasping during sleep. Irritability. Forgetfulness. Trouble thinking clearly. Depression. Personality changes. Most people with sleep apnea do not know that they have it. What are the advantages of sleep apnea screening? Getting screened for sleep apnea can help: Ensure your safety. It is important for your health care providers to know whether or not you have sleep apnea, especially if you are having surgery or have other long-term (chronic) health conditions. Improve your health and allow you to get a better night's rest. Restful sleep can help you: Have more energy. Lose weight. Improve high blood pressure. Improve diabetes management. Prevent stroke. Prevent car accidents. What happens during the screening? Screening usually includes being asked a list of questions about your sleep quality. Some questions you may be asked include: Do you snore? Is your sleep restless? Do you have daytime sleepiness? Has a partner or spouse told you that you stop breathing during sleep? Have you had trouble concentrating or memory loss? What is your age? What is your neck circumference? To measure your neck, keep your back straight and gently wrap the tape measure around your neck. Put the tape measure at the middle of your neck, between your chin and collarbone. What is your sex assigned at birth? Do you have or are you being treated for high blood  pressure? If your screening test is positive, you are at risk for the condition. Furthertesting may be needed to confirm a diagnosis of sleep apnea. Where to find more information You can find screening tools online or at your health care clinic. For more information about sleep apnea screening and healthy sleep, visit these websites: Centers for Disease Control and Prevention: http://www.wolf.info/ American Sleep Apnea Association: www.sleepapnea.org Contact a health care provider if: You think that you may have sleep apnea. Summary Sleep apnea screening can help determine if you are at risk for sleep apnea. It is important for your health care providers to know whether or not you have sleep apnea, especially if you are having surgery or have other chronic health conditions. You may be asked to take a screening test for sleep apnea in preparation for surgery or as part of a physical exam. This information is not intended to replace advice given to you by your health care provider. Make sure you discuss any questions you have with your healthcare provider. Document Revised: 05/31/2020 Document Reviewed: 05/31/2020 Elsevier Patient Education  [place]. [email or web address]">  Obesity-Hypoventilation Syndrome Obesity-hypoventilation syndrome (OHS) is a condition in which a person cannot efficiently move air in and out of the lungs (ventilate). This condition causes hypoventilation, which means the blood will have a buildup of carbon dioxideand a drop in oxygen levels. Key factors for OHS include having too much body fat, or obesity, and having high levels of awake daytime carbon dioxide (hypercapnia). OHS can increase the risk for: Cor pulmonale, or right-sided heart failure. Congestive heart failure. Pulmonary hypertension, or high blood pressure in the arteries in  the lungs. Too many red blood cells in the  body. Disability or death. What are the causes? This condition may be caused by: Being obese with a BMI (body mass index) greater than or equal to 30 kg/m2. Having too much fat around the abdomen, chest, and neck. The brain being unable to properly manage the high carbon dioxide and low oxygen levels. Hormones made by fat cells. These hormones may interfere with breathing. Sleep apnea. This is when breathing stops, pauses, or is shallow during sleep. What are the signs or symptoms? Symptoms of this condition include: Feeling sleepy during the day. Headaches. These may be worse in the morning. Shortness of breath. Snoring, choking, gasping, or trouble breathing during sleep. Poor concentration or poor memory. Mood changes or feeling irritable. Depression. How is this diagnosed? This condition may be diagnosed by: BMI measurement. Blood tests to measure blood levels of serum bicarbonate, carbon dioxide, and oxygen. Pulse oximetry to measure the amount of oxygen in your blood. This uses a small device that is placed on your finger, earlobe, or toe. Polysomnogram, or sleep study, to check your breathing patterns and levels of oxygen and carbon dioxide while you sleep. You may also have other tests, including: A chest X-ray to rule out other breathing problems. Lung tests, or pulmonary function tests, to rule out other breathing problems. ECG (electrocardiogram) or echocardiogram to check for signs of heart failure. How is this treated? This condition may be treated with: A device such as a continuous positive airway pressure (CPAP) machine or a bi-level positive airway pressure (BPAP) machine. These devices deliver pressure and sometimes oxygen to make breathing easier. A mask may be placed over your nose or mouth. Oxygen if your blood oxygen levels are low. A weight-loss program. Bariatric, or weight-loss, surgery. Tracheostomy. A tube is placed in the windpipe through the neck to help  with breathing. Follow these instructions at home:  Medicines Take over-the-counter and prescription medicines only as told by your health care provider. Ask your health care provider what medicines are safe for you. You may be told to avoid medicines such as sedatives and narcotics. These can affect breathing and make OHS worse. Sleeping habits If you are prescribed a CPAP or a BPAP machine, make sure you understand how to use it. Use your CPAP or BPAP machine only as told by your health care provider. Try to get at least 8 hours of sleep every night. Eating and drinking  Eat foods that are high in fiber, such as beans, whole grains, and fresh fruits and vegetables. Limit foods that are high in fat and processed sugars, such as fried or sweet foods. Drink enough fluid to keep your urine pale yellow. Do not drink alcohol if: Your health care provider tells you not to drink. You are pregnant, may be pregnant, or are planning to become pregnant.  General instructions Follow a diet and exercise plan that helps you reach and keep a healthy weight as told by your health care provider. Exercise regularly as told by your health care provider. Do not use any products that contain nicotine or tobacco, such as cigarettes, e-cigarettes, and chewing tobacco. If you need help quitting, ask your health care provider. Keep all follow-up visits as told by your health care provider. This is important. Contact a health care provider if: You develop new or worsening shortness of breath. You are having trouble waking up or staying awake. You are confused. You have chest pain. You have fast or  irregular heartbeats. You are dizzy or you faint. You develop a cough. You have a fever. Get help right away if: You have any symptoms of a stroke. "BE FAST" is an easy way to remember the main warning signs of a stroke: B - Balance. Signs are dizziness, sudden trouble walking, or loss of balance. E - Eyes. Signs  are trouble seeing or a sudden change in vision. F - Face. Signs are sudden weakness or numbness of the face, or the face or eyelid drooping on one side. A - Arms. Signs are weakness or numbness in an arm. This happens suddenly and usually on one side of the body. S - Speech. Signs are sudden trouble speaking, slurred speech, or trouble understanding what people say. T - Time. Time to call emergency services. Write down what time symptoms started. You have other signs of a stroke, such as: A sudden, severe headache with no known cause. Nausea or vomiting. Seizure. These symptoms may represent a serious problem that is an emergency. Do not wait to see if the symptoms will go away. Get medical help right away. Call your local emergency services (911 in the [place] Do not drive yourself to the hospital. If you ever feel like you may hurt yourself or others, or have thoughts about taking your own life, get help right away. Go to your nearest emergency department or: Call your local emergency services (911 in the [place] Call a suicide crisis helpline, such as the [facility] at 828-004-0828. This is open 24 hours a day in the [place] Text the Crisis Text Line at (947)386-9135 (in the [place].). Summary Obesity-hypoventilation syndrome (OHS) causes hypoventilation, which means the blood will have a buildup of carbon dioxideand a drop in oxygen levels. Key factors for OHS include having too much body fat, or obesity, and having high levels of awake daytime carbon dioxide (hypercapnia). OHS can increase the risk for heart failure, pulmonary hypertension, disability, and death. Follow your diet and exercise plan as told by your health care provider. This information is not intended to replace advice given to you by your health care provider. Make sure you discuss any questions you have with your healthcare provider. Document Revised: 06/16/2019 Document Reviewed: 06/16/2019 Elsevier Patient  Education  2022 [employer].

## 2020-12-17 NOTE — Progress Notes (Signed)
SLEEP MEDICINE CLINIC    Provider:  Larey Seat, MD  Primary Care Physician:  Donald Prose, [place] [id number]     Referring Provider: Dr. Thermon Leyland, MD         Chief Complaint according to patient   Patient presents with:     New Patient (Initial Visit)           HISTORY OF PRESENT ILLNESS:  Vanessa Wilcox is a 40  year old African -American female patient and seen upon referral on 12/17/2020 from Dr Thermon Leyland  for a Sleep Evaluation, she reports all her life having been heavy, at lest since her teens. She gained most weight in the last 4 years. Knee pain, SOB. She also has very large  breasts and their weight affects her posture and breathing.    Chief concern according to patient :  I need bariatric surgery after Ozempic and Vyvanse haven't helped that much with weight loss. I have been at weight and wellness for 2 years.    Vanessa Wilcox  has a past medical history of Anxiety, Back pain, Family history of lung cancer, Family history of multiple myeloma, Gestational hypertension, Pre-ecclamsia, and HTN.  Sleep relevant medical history: Nocturia 2-3 times, no Tonsillectomy, poor posture, obesity, cervical spine tension.    Family medical /sleep history: Her son has apnea at age 48, mother was on CPAP with OSA.  Social history:  Patient is working as Automotive engineer and lives in a household with spouse and son, 33 , 3 step daughters -( 41, 62, 35 ) persons/ alone.  The patient currently works in the school system-   Tobacco use: never. ETOH use : rare ,  Caffeine intake in form of Coffee( none ) Soda( rare now ) Tea ( /) or energy drinks. Regular exercise in form of walking. .   Hobbies : walking, read, arts and crafts.    Sleep habits are as follows: The patient's dinner time is between 6-7 PM. The patient goes to bed at 10 PM and continues to sleep  after 11 Pm for 6-7 hours, wakes for 2-3 bathroom breaks, the first time  at 1 AM.   The preferred sleep position is supine , with the support of 3 pillows. Sleeeps better slightly reclined- Dreams are reportedly frequent/vivid.  5.30 AM is the usual rise time.  The patient wakes up with an alarm.  She reports mostly  feeling refreshed or restored in AM, without symptoms .  Naps were taken frequently, but she stopped them- one nap in the last 14 days.  Naps bring her down, depressing.     Review of Systems: Out of a complete 14 system review, the patient complains of only the following symptoms, and all other reviewed systems are negative.:  Fatigue, not sleepiness,  sleepiness mostly related to depression,  snoring. Husband stated she snores. She states he snores,    How likely are you to doze in the following situations: 0 = not likely, 1 = slight chance, 2 = moderate chance, 3 = high chance   Sitting and Reading? Watching Television? Sitting inactive in a public place (theater or meeting)? As a passenger in a car for an hour without a break? Lying down in the afternoon when circumstances permit? Sitting and talking to someone? Sitting quietly after lunch without alcohol? In a car, while stopped for a few minutes in traffic?   Total = 5/ 24 points  FSS endorsed at 22/ 63 points.   Social History   Socioeconomic History   Marital status: Married    Spouse name: Marjory Lies   Number of children: Not on file   Years of education: Not on file   Highest education level: Not on file  Occupational History   Occupation: Education officer, museum  Tobacco Use   Smoking status: Never   Smokeless tobacco: Never  Vaping Use   Vaping Use: Never used  Substance and Sexual Activity   Alcohol use: Yes    Comment: 1 drink/month- occasional   Drug use: No   Sexual activity: Yes    Birth control/protection: Pill  Other Topics Concern   Not on file  Social History Narrative   Not on file   Social Determinants of Health   Financial Resource Strain: Not on file  Food  Insecurity: Not on file  Transportation Needs: Not on file  Physical Activity: Not on file  Stress: Not on file  Social Connections: Not on file    Family History  Problem Relation Age of Onset   Diabetes Mother    High blood pressure Mother    Depression Mother    Sleep apnea Mother    Obesity Mother    Hypertension Mother    Hyperlipidemia Mother    Thyroid disease Mother    Lung cancer Mother 5   High Cholesterol Father    Cancer Father        unknown type - leg amputated   Alcoholism Father    Ovarian cancer Half-Sister 56       Dec ovarian Cancer   Breast cancer Half-Sister 51   Diabetes Maternal Grandmother    Stroke Maternal Grandfather    Multiple myeloma Cousin 9       in remission (maternal first cousin)    Past Medical History:  Diagnosis Date   Anxiety    Back pain    Depression    Family history of lung cancer    Family history of multiple myeloma    Gestational hypertension    High blood pressure    STD (sexually transmitted disease)    Tx'd for chlamydia over 20 years ago    Past Surgical History:  Procedure Laterality Date   ASPIRATION BIOPSY     fluid around heart- negative results in 2000   fluid on heart  2000   chest tube placed   IUD removal under sedation       Current Outpatient Medications on File Prior to Visit  Medication Sig Dispense Refill   lisdexamfetamine (VYVANSE) 20 MG capsule Take 1 capsule (20 mg total) by mouth daily. 30 capsule 0   norethindrone (MICRONOR) 0.35 MG tablet Take 1 tablet by mouth daily.     Semaglutide, 1 MG/DOSE, (OZEMPIC, 1 MG/DOSE,) 4 MG/3ML SOPN Inject 1 mg into the skin once a week. 3 mL 0   Vitamin D, Ergocalciferol, (DRISDOL) 1.25 MG (50000 UNIT) CAPS capsule Take 1 capsule (50,000 Units total) by mouth every 7 (seven) days. 4 capsule 0   No current facility-administered medications on file prior to visit.    No Known Allergies  Physical exam:  Today's Vitals   12/17/20 1006  BP: (!)  142/84  Pulse: 84  Weight: 297 lb (134.7 kg)  Height: 5' 2.5" (1.588 m)   Body mass index is 53.46 kg/m.   Wt Readings from Last 3 Encounters:  12/17/20 297 lb (134.7 kg)  12/03/20 288 lb (130.6 kg)  11/07/20  290 lb (131.5 kg)     Ht Readings from Last 3 Encounters:  12/17/20 5' 2.5" (1.588 m)  12/03/20 _0  (1.6 m)  11/07/20 _1  (1.6 m)      General: The patient is awake, alert and appears not in acute distress. The patient is well groomed. Head: Normocephalic, atraumatic. Neck is supple. Mallampati 2, midline  short ,  neck circumference:16. 25  inches . Nasal airflow patent.   Retrognathia is  seen.  Dental status: biological  Cardiovascular:  Regular rate and cardiac rhythm by pulse,  without distended neck veins. Respiratory: Lungs are clear to auscultation.  Skin:  Without evidence of ankle edema, or rash. Trunk: The patient's posture is erect.   Neurologic exam : The patient is awake and alert, oriented to place and time.   Memory subjective described as intact.  Attention span & concentration ability appears normal.  Speech is fluent,  without  dysarthria, dysphonia or aphasia.  Mood and affect are appropriate.   Cranial nerves: no loss of smell or taste reported  Pupils are equal and briskly reactive to light. Funduscopic exam deferred.  Extraocular movements in vertical and horizontal planes were intact and without nystagmus. No Diplopia. Visual fields by finger perimetry are intact. Hearing was intact to soft voice and finger rubbing.    Facial sensation intact to fine touch.  Facial motor strength is symmetric and tongue and uvula move midline.  Neck ROM : rotation, tilt and flexion extension were normal for age and shoulder shrug was symmetrical.    Motor exam:  Symmetric bulk, tone and ROM.   Normal tone without cog wheeling, symmetric grip strength .   Sensory:  Fine touch, pinprick and vibration were tested  and  normal.  Proprioception tested in  the upper extremities was normal.   Coordination: Rapid alternating movements in the fingers/hands were of normal speed.  The Finger-to-nose maneuver was intact without evidence of ataxia, dysmetria or tremor.   Gait and station: Patient could rise unassisted from a seated position, walked without assistive device.  Stance is of normal width/ base. Toe and heel walk were deferred.  Deep tendon reflexes: in the  upper and lower extremities are symmetric and intact.  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 She has reached a BMI of 53 and has HTN, history of pre-ecclampsia. She has lost 18 pounds with weight and wellness and is interested in bariatric surgery, she is interested in gastric bypass.  2) she has high risk factors for OSA.  3) she only recently stopped having frequent nocturia and sleeps better since using Ashwaganda.    My Plan is to proceed with:  1)  a screening test for sleep apnea is needed. PSG or HST are possible choices.  2) if she should need CPAP , based on results, she would need retesting in 12 -18 month after weight loss.    I would like to thank Dr. Thermon Leyland, MD @ [facility]  for allowing me to meet with and to take care of this  pleasant patient.    I plan to follow up either personally or through our NP within 3-4 month.   CC: I will share my notes with surgeon and PCP.  Electronically signed by: Larey Seat, MD 12/17/2020 10:22 AM  [facility] Board certified by The [employer] of Sleep Medicine and Diplomate of the [employer] of Sleep Medicine. Board certified In Neurology through the [place], Fellow of the [employer] of Neurology. Medical Director of [facility].

## 2020-12-18 ENCOUNTER — Ambulatory Visit (INDEPENDENT_AMBULATORY_CARE_PROVIDER_SITE_OTHER): Payer: BC Managed Care – PPO | Admitting: Family Medicine

## 2020-12-23 NOTE — Progress Notes (Signed)
Office: [phone]  /  Fax: [phone]    Date: December 23, 2020   Appointment Start Time: 3:03pm Duration: 29 minutes Provider: Glennie Isle, Psy.D. Type of Session: Individual Therapy  Location of Patient: Home Location of Provider: Provider's home (private office) Type of Contact: Telepsychological Visit via MyChart Video Visit  Session Content: Vanessa Wilcox is a 40 y.o. female presenting for a follow-up appointment to address the previously established treatment goal of increasing coping skills. Today's appointment was a telepsychological visit due to COVID-19. Vanessa Wilcox provided verbal consent for today's telepsychological appointment and she is aware she is responsible for securing confidentiality on her end of the session. Prior to proceeding with today's appointment, Vanessa Wilcox's physical location at the time of this appointment was obtained as well a phone number she could be reached at in the event of technical difficulties. Vanessa Wilcox and this provider participated in today's telepsychological service.   This provider conducted a brief check-in and verbally administered the PHQ-9 and GAD-7. Vanessa Wilcox stated she is pursuing bariatric surgery, adding the process is "moving really, really fast." She discussed feeling "wonky" as she "get[s] closer" to having surgery. This was further explored. Approximately two weeks ago, she stated she started snacking again at night. Vanessa Wilcox stated she recently completed the "psych eval" component of the bariatric surgery process. It was recommended during the evaluation she initiate therapeutic services for supportive services through the surgery process and to address her relationship with food. She provided verbal consent for this provider to e-mail referral options. This provider also shared about bariatric surgery support groups offered by [facility]. She provided verbal consent for this provider to send links with information via e-mail. Moreover, Vanessa Wilcox  reportedly diagnosed her with Binge Eating Disorder and ADHD, adding she was prescribed Vyvanse. She described the Vyvanse as helpful, noting it helps her "focus" and "concentrate." Emotional versus physical hunger were reviewed. Golden provided verbal consent during today's appointment for this provider to re-send the handout about hunger patterns via e-mail. Additionally, this provider discussed the impact of sleep deprivation on hunger. Vanessa Wilcox indicated a plan to develop a sleep routine. Vanessa Wilcox was receptive to today's appointment as evidenced by openness to sharing, responsiveness to feedback, and willingness to implement discussed strategies . She added, "I feel better."   Mental Status Examination:  Appearance: well groomed and appropriate hygiene  Behavior: appropriate to circumstances Mood: anxious Affect: mood congruent; tearful when discussing recent events  Speech: normal in rate, volume, and tone Eye Contact: appropriate Psychomotor Activity: unable to assess Gait: unable to assess Thought Process: linear, logical, and goal directed  Thought Content/Perception: denies suicidal and homicidal ideation, plan, and intent, no hallucinations, delusions, bizarre thinking or behavior reported or observed, and denies ideation and engagement in self-injurious behaviors Orientation: time, person, place, and purpose of appointment Memory/Concentration: memory, attention, language, and fund of knowledge intact  Insight/Judgment: fair  Structured Assessments Results: The Patient Health Questionnaire-9 (PHQ-9) is a self-report measure that assesses symptoms and severity of depression over the course of the last two weeks. Vanessa Wilcox obtained a score of 2 suggesting minimal depression. Vanessa Wilcox finds the endorsed symptoms to be not difficult at all. [0= Not at all; 1= Several days; 2= More than half the days; 3= Nearly every day] Little interest or pleasure in doing things 0  Feeling down, depressed, or  hopeless 0  Trouble falling or staying asleep, or sleeping too much 0  Feeling tired or having little energy 0  Poor appetite or overeating 2  Feeling  bad about yourself --- or that you are a failure or have let yourself or your family down 0  Trouble concentrating on things, such as reading the newspaper or watching television 0  Moving or speaking so slowly that other people could have noticed? Or the opposite --- being so fidgety or restless that you have been moving around a lot more than usual 0  Thoughts that you would be better off dead or hurting yourself in some way 0  PHQ-9 Score 2    The Generalized Anxiety Disorder-7 (GAD-7) is a brief self-report measure that assesses symptoms of anxiety over the course of the last two weeks. Vanessa Wilcox obtained a score of 2 suggesting minimal anxiety. Vanessa Wilcox finds the endorsed symptoms to be not difficult at all. [0= Not at all; 1= Several days; 2= Over half the days; 3= Nearly every day] Feeling nervous, anxious, on edge 1  Not being able to stop or control worrying 1  Worrying too much about different things 0  Trouble relaxing 0  Being so restless that it's hard to sit still 0  Becoming easily annoyed or irritable 0  Feeling afraid as if something awful might happen 0  GAD-7 Score 2   Interventions:  Conducted a brief chart review Verbal administration of PHQ-9 and GAD-7 for symptom monitoring Provided empathic reflections and validation Processed thoughts and feelings Psychoeducation provided regarding physical versus emotional hunger Discussed option for a referral for longer-term therapeutic services Psychoeducation about the impact of sleep deprivation on hunger  DSM-5 Diagnosis(es): F50.89 Other Specified Feeding or Eating Disorder, Emotional and Binge Eating Behaviors and 314.01 (F90.9) Unspecified Attention-Deficit/Hyperactivity Disorder   Treatment Goal & Progress: During the initial appointment with this provider, the following  treatment goal was established: increase coping skills. Vanessa Wilcox has demonstrated progress as she continues to demonstrate willingness to engage in learned skill(s).  Plan: The next appointment will be scheduled in three weeks, which will be via MyChart Video Visit. The next session will focus on working towards the established treatment goal.

## 2020-12-24 ENCOUNTER — Telehealth (INDEPENDENT_AMBULATORY_CARE_PROVIDER_SITE_OTHER): Payer: BC Managed Care – PPO | Admitting: Psychology

## 2020-12-24 ENCOUNTER — Telehealth: Payer: Self-pay

## 2020-12-24 DIAGNOSIS — F5089 Other specified eating disorder: Secondary | ICD-10-CM

## 2020-12-24 DIAGNOSIS — F909 Attention-deficit hyperactivity disorder, unspecified type: Secondary | ICD-10-CM | POA: Diagnosis not present

## 2020-12-24 NOTE — Telephone Encounter (Signed)
LVM for pt to call me back to schedule sleep study  

## 2020-12-30 ENCOUNTER — Ambulatory Visit: Payer: Self-pay | Admitting: Skilled Nursing Facility1

## 2020-12-31 NOTE — Progress Notes (Signed)
  Office: [phone]  /  Fax: [phone]    Date: January 14, 2021   Appointment Start Time: 12:27pm Duration: 28 minutes Provider: Glennie Isle, Psy.D. Type of Session: Individual Therapy  Location of Patient: Home Location of Provider: Provider's home (private office) Type of Contact: Telepsychological Visit via MyChart Video Visit  Session Content: Vanessa Wilcox is a 40 y.o. female presenting for a follow-up appointment to address the previously established treatment goal of increasing coping skills. Today's appointment was a telepsychological visit due to COVID-19. Vanessa Wilcox provided verbal consent for today's telepsychological appointment and she is aware she is responsible for securing confidentiality on her end of the session. Prior to proceeding with today's appointment, Vanessa Wilcox's physical location at the time of this appointment was obtained as well a phone number she could be reached at in the event of technical difficulties. Vanessa Wilcox and this provider participated in today's telepsychological service.   This provider conducted a brief check-in. Vanessa Wilcox provided updates regarding the bariatric surgery process, including her recent appointment with a nutritionist. Associated thoughts and feelings were processed. Since the last appointment with this provider, Vanessa Wilcox denied engagement in binge and emotional eating behaviors. Additionally, she stated she has left messages with therapists to initiate services, adding she has not heard back yet. Moreover, Vanessa Wilcox discussed an improvement in sleep. Positive reinforcement was provided.    Session focused on reviewing strategies to cope with stress to avoid engagement in binge and emotional eating behaviors (e.g., listen to a podcast, listen to audiobook, grounding technique, listen to specific music playlist with upbeat music, read a devotional, go for a walk, engage in a mindfulness exercise). She was encouraged to review previously shared handouts for  additional coping strategies; she agreed. Vanessa Wilcox was receptive to today's appointment as evidenced by openness to sharing, responsiveness to feedback, and willingness to continue engaging in learned skills. She also described feeling more in control.   Mental Status Examination:  Appearance: well groomed and appropriate hygiene  Behavior: appropriate to circumstances Mood: euthymic Affect: mood congruent Speech: normal in rate, volume, and tone Eye Contact: appropriate Psychomotor Activity: unable to assess Gait: unable to assess Thought Process: linear, logical, and goal directed  Thought Content/Perception: no hallucinations, delusions, bizarre thinking or behavior reported or observed and no evidence or endorsement of suicidal and homicidal ideation, plan, and intent Orientation: time, person, place, and purpose of appointment Memory/Concentration: memory, attention, language, and fund of knowledge intact  Insight/Judgment: good  Interventions:  Conducted a brief chart review Provided empathic reflections and validation Reviewed content from the previous session Engaged patient in problem solving Reviewed learned skills Provided positive reinforcement Employed supportive psychotherapy interventions to facilitate reduced distress, and to improve coping skills with identified stressors  DSM-5 Diagnosis(es):  F50.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 Vanessa Wilcox demonstrated progress in her goal as evidenced by identifying/reviewing learned skills to assist with emotional/binge eating behaviors.   Plan: Vanessa Wilcox stated she plans to attend the next bariatric support group with [facility]. She also reported a plan to reach out to additional  therapists to find a new provider to establish care. No further follow-up planned by this provider.

## 2021-01-01 ENCOUNTER — Other Ambulatory Visit: Payer: Self-pay

## 2021-01-01 ENCOUNTER — Ambulatory Visit (INDEPENDENT_AMBULATORY_CARE_PROVIDER_SITE_OTHER): Payer: BC Managed Care – PPO | Admitting: Family Medicine

## 2021-01-01 ENCOUNTER — Encounter (INDEPENDENT_AMBULATORY_CARE_PROVIDER_SITE_OTHER): Payer: Self-pay | Admitting: Family Medicine

## 2021-01-01 ENCOUNTER — Ambulatory Visit: Payer: Self-pay | Admitting: Surgery

## 2021-01-01 VITALS — BP 127/79 | HR 70 | Temp 98.2°F | Ht 63.0 in | Wt 292.0 lb

## 2021-01-01 DIAGNOSIS — Z6841 Body Mass Index (BMI) 40.0 and over, adult: Secondary | ICD-10-CM

## 2021-01-01 DIAGNOSIS — F3289 Other specified depressive episodes: Secondary | ICD-10-CM

## 2021-01-01 DIAGNOSIS — E8881 Metabolic syndrome: Secondary | ICD-10-CM

## 2021-01-01 DIAGNOSIS — Z9189 Other specified personal risk factors, not elsewhere classified: Secondary | ICD-10-CM

## 2021-01-01 DIAGNOSIS — E559 Vitamin D deficiency, unspecified: Secondary | ICD-10-CM | POA: Diagnosis not present

## 2021-01-02 ENCOUNTER — Encounter: Payer: Self-pay | Admitting: Skilled Nursing Facility1

## 2021-01-02 ENCOUNTER — Encounter: Payer: BC Managed Care – PPO | Attending: Surgery | Admitting: Skilled Nursing Facility1

## 2021-01-02 ENCOUNTER — Other Ambulatory Visit: Payer: Self-pay

## 2021-01-02 ENCOUNTER — Encounter (INDEPENDENT_AMBULATORY_CARE_PROVIDER_SITE_OTHER): Payer: Self-pay | Admitting: Family Medicine

## 2021-01-02 DIAGNOSIS — Z6841 Body Mass Index (BMI) 40.0 and over, adult: Secondary | ICD-10-CM | POA: Diagnosis present

## 2021-01-02 MED ORDER — VITAMIN D (ERGOCALCIFEROL) 1.25 MG (50000 UNIT) PO CAPS: 50000.0000 [IU] | ORAL_CAPSULE | ORAL | 0 refills | Status: DC

## 2021-01-02 NOTE — Progress Notes (Signed)
Nutrition Assessment for Bariatric Surgery Medical Nutrition Therapy Appt Start Time: 8:45    End Time: 9:55  Patient was seen on 01/02/2021 for Pre-Operative Nutrition Assessment. Letter of approval faxed to [facility] bariatric surgery program coordinator on 01/02/2021.   Referral stated Supervised Weight Loss (SWL) visits needed: 0  Not cleared at this time:  Pt to follow up for minimum of one more visit to assist pt with progressing through stages of change/further nutrition education. RD advised pt that this follow up visit is not mandated through insurance. Pt verbalized agreement.   Planned surgery: Sleeve or RYGB Pt expectation of surgery: to lose weight Pt expectation of dietitian: guidance     NUTRITION ASSESSMENT   Anthropometrics  Start weight at [facility]: 297.9 lbs (date: 01/02/2021)  Height: 62.5 in BMI: 53.62 kg/m2     Clinical  Medical hx: ozempic, micronor, vyvanse, vitmain D Medications: HTN, anxiety, depression, ADHD   Labs: vitamin D 29.9, A1C 5.5 Notable signs/symptoms: N/A Any previous deficiencies? Vitmain D  Micronutrient Nutrition Focused Physical Exam: Hair: No issues observed Eyes: No issues observed Mouth: No issues observed Neck: No issues observed Nails: No issues observed Skin: No issues observed  Lifestyle & Dietary Hx  Pt states she has been seeing healthy weight and wellness for the last 2 years.  Pt states she really wants the RYGB due to fears of developing GERD with the sleeve.   Pt states ozempic + vyvanse really helps her with her appetite.  Pt states she got back in with her therapist when she realized she was binge eating again. Pt states for the next 3 weeks she wants to have a protein shake in the morning and a protein shake for lunch.  Pt states she is about a 5 on a  scale of 1-10 for BED awareness. Pt states she is realizing she is up later than she needs to be which allows her to eat more.   Pt states he  journaling really works for her.   24-Hr Dietary Recall First Meal: skipped Snack:  Second Meal: chicken salad + crackers Snack:  Third Meal: chicken + mashed potato + broccoli + cheese Snack: popcorn + cheese + granola bar (X3) + candy Beverages: water, fruit juice   Estimated Energy Needs Calories: 1500   NUTRITION DIAGNOSIS  Overweight/obesity ([place]-3.3) related to past poor dietary habits and physical inactivity as evidenced by patient w/ planned sleeve or RYGB surgery following dietary guidelines for continued weight loss.    NUTRITION INTERVENTION  Nutrition counseling (C-1) and education (E-2) to facilitate bariatric surgery goals.  Educated pt on micronutrient deficiencies post surgery and strategies to mitigate that risk   Pre-Op Goals Reviewed with the Patient Track food and beverage intake (pen and paper, MyFitness Pal, Baritastic app, etc.): using the mindful meals sheet Make healthy food choices while monitoring portion sizes Consume 3 meals per day or try to eat every 3-5 hours Avoid concentrated sugars and fried foods Keep sugar & fat in the single digits per serving on food labels Practice CHEWING your food (aim for applesauce consistency) Practice not drinking 15 minutes before, during, and 30 minutes after each meal and snack Avoid all carbonated beverages (ex: soda, sparkling beverages)  Limit caffeinated beverages (ex: coffee, tea, energy drinks) Avoid all sugar-sweetened beverages (ex: regular soda, sports drinks)  Avoid alcohol  Aim for 64-100 ounces of FLUID daily (with at least half of fluid intake being plain water)  Aim for at least 60-80 grams  of PROTEIN daily Look for a liquid protein source that contains ?15 g protein and ?5 g carbohydrate (ex: shakes, drinks, shots) Make a list of non-food related activities Physical activity is an important part of a healthy lifestyle so keep it moving! The goal is to reach 150 minutes of exercise per week,  including cardiovascular and weight baring activity. Set a timer for meals Call a eating disorder therapist today  *Goals that are bolded indicate the pt would like to start working towards these  Handouts Provided Include  Bariatric Surgery handouts (Nutrition Visits, Pre-Op Goals, Protein Shakes, Vitamins & Minerals)  Learning Style & Readiness for Change Teaching method utilized: Visual & Auditory  Demonstrated degree of understanding via: Teach Back  Readiness Level: contemplative  Barriers to learning/adherence to lifestyle change: ACTIVE BED  RD's Notes for Next Visit Assess pts adherence to chosen goals     MONITORING & EVALUATION Dietary intake, weekly physical activity, body weight, and pre-op goals reached at next nutrition visit.    Next Steps  Patient is to follow up at [place] for Pre-Op Class >2 weeks before surgery for further nutrition education.

## 2021-01-02 NOTE — Telephone Encounter (Signed)
Please advise 

## 2021-01-02 NOTE — Progress Notes (Signed)
Chief Complaint:   OBESITY Vanessa Wilcox is here to discuss her progress with her obesity treatment plan along with follow-up of her obesity related diagnoses. See Medical Weight Management Flowsheet for complete bioelectrical impedance results.  Today's visit was #: 68 Starting weight: 306 lbs Starting date: 02/09/2019 Today's weight: 292 lbs Today's date: 01/01/2021 Weight change since last visit: +4 lbs Total lbs lost to date: 14 lbs Body mass index is 51.73 kg/m.  Total weight loss percentage to date: -4.58%  Interim History: Vanessa Wilcox just had 9 days of vacation.  She has not been taking Ozempic for 2-3 weeks.  She has been eating at night again.  She has an appointment to see Dr. Mallie Mussel again on July 12.  Nutrition Plan: keeping a food journal and adhering to recommended goals of 1300 calories and 95 grams of protein for 0% of the time. Activity:  Increased walking.  Assessment/Plan:   1. Vitamin D deficiency Not at goal.  She is taking vitamin D 50,000 IU weekly.  Plan: Continue to take prescription Vitamin D @50 ,000 IU every week as prescribed.  Follow-up for routine testing of Vitamin D, at least 2-3 times per year to avoid over-replacement.  Lab Results  Component Value Date   VD25OH 29.9 (L) 05/28/2020   VD25OH 7.6 (L) 02/09/2019   - Refill Vitamin D, Ergocalciferol, (DRISDOL) 1.25 MG (50000 UNIT) CAPS capsule; Take 1 capsule (50,000 Units total) by mouth every 7 (seven) days.  Dispense: 4 capsule; Refill: 0  2. Metabolic syndrome Starting goal: Lose 7-10% of starting weight. She will continue to focus on protein-rich, low simple carbohydrate foods. We reviewed the importance of hydration, regular exercise for stress reduction, and restorative sleep.  We will continue to check lab work every 3 months, with 10% weight loss, or should any other concerns arise.  3. Other depression, with emotional eating Not at goal. Medication: None.  Plan:  Vanessa Wilcox has an upcoming  appointment scheduled with Dr. Mallie Mussel. Behavior modification techniques were discussed today to help deal with emotional/non-hunger eating behaviors.  4. At risk for heart disease Due to Vanessa Wilcox's current state of health and medical condition(s), she is at a higher risk for heart disease.  This puts the patient at much greater risk to subsequently develop cardiopulmonary conditions that can significantly affect patient's quality of life in a negative manner.    At least 8 minutes were spent on counseling Vanessa Wilcox about these concerns today. Evidence-based interventions for health behavior change were utilized today including the discussion of self monitoring techniques, problem-solving barriers, and SMART goal setting techniques.  Specifically, regarding patient's less desirable eating habits and patterns, we employed the technique of small changes when Vanessa Wilcox has not been able to fully commit to her prudent nutritional plan.  5. Obesity, current BMI 51.8  Course: Vanessa Wilcox is currently in the action stage of change. As such, her goal is to continue with weight loss efforts.   Nutrition goals: She has agreed to keeping a food journal and adhering to recommended goals of 1300 calories and 95 grams of protein.   Exercise goals:  Increased walking.  Behavioral modification strategies: increasing lean protein intake, decreasing simple carbohydrates, and increasing vegetables.  Vanessa Wilcox has agreed to follow-up with our clinic in 4 weeks. She was informed of the importance of frequent follow-up visits to maximize her success with intensive lifestyle modifications for her multiple health conditions.   Objective:   Blood pressure 127/79, pulse 70, temperature 98.2 F (36.8 C), temperature  source Oral, height 5\' 3"  (1.6 m), weight 292 lb (132.5 kg), SpO2 100 %. Body mass index is 51.73 kg/m.  General: Cooperative, alert, well developed, in no acute distress. HEENT: Conjunctivae and lids  unremarkable. Cardiovascular: Regular rhythm.  Lungs: Normal work of breathing. Neurologic: No focal deficits.   Lab Results  Component Value Date   CREATININE 0.91 05/24/2020   BUN 8 05/24/2020   NA 139 05/24/2020   K 4.1 05/24/2020   CL 103 05/24/2020   CO2 28 05/24/2020   Lab Results  Component Value Date   ALT 13 05/24/2020   AST 17 05/24/2020   ALKPHOS 55 05/24/2020   BILITOT 0.4 05/24/2020   Lab Results  Component Value Date   HGBA1C 5.5 05/28/2020   HGBA1C 5.7 (H) 02/09/2019   Lab Results  Component Value Date   INSULIN 10.6 05/28/2020   INSULIN 21.5 02/09/2019   Lab Results  Component Value Date   VD25OH 29.9 (L) 05/28/2020   VD25OH 7.6 (L) 02/09/2019   Lab Results  Component Value Date   WBC 14.3 (H) 05/24/2020   HGB 13.9 05/24/2020   HCT 43.8 05/24/2020   MCV 83.7 05/24/2020   PLT 239 05/24/2020   Attestation Statements:   Reviewed by clinician on day of visit: allergies, medications, problem list, medical history, surgical history, family history, social history, and previous encounter notes.  I, Water quality scientist, CMA, am acting as transcriptionist for Briscoe Deutscher, DO  I have reviewed the above documentation for accuracy and completeness, and I agree with the above. Briscoe Deutscher, DO

## 2021-01-08 ENCOUNTER — Encounter: Payer: BC Managed Care – PPO | Attending: Surgery | Admitting: Skilled Nursing Facility1

## 2021-01-08 ENCOUNTER — Other Ambulatory Visit: Payer: Self-pay

## 2021-01-08 DIAGNOSIS — Z6841 Body Mass Index (BMI) 40.0 and over, adult: Secondary | ICD-10-CM | POA: Insufficient documentation

## 2021-01-08 NOTE — Progress Notes (Signed)
Supervised Weight Loss Visit Bariatric Nutrition Education  NUTRITION ASSESSMENT  Pt completed visits.   Pt has cleared nutrition requirements. AS long as pt begins working with an eating disorder therapist.    Anthropometrics  Start weight at [facility]: 297.9 lbs (date: 01/02/2021)  Weight: 297.8 pounds Height: 62.5 in BMI: 53.55 kg/m2     Clinical  Medical hx: ozempic, micronor, vyvanse, vitmain D Medications: HTN, anxiety, depression, ADHD   Labs: vitamin D 29.9, A1C 5.5 Notable signs/symptoms: N/A Any previous deficiencies? Vitmain D  Lifestyle & Dietary Hx  Pt states she got a list of eating disorder providers but did not find it in her email.  Pt returns with her mindful meals sheet stating she did not really learn much from it.  Pt states on a  scale of 1-10 her awareness of binge eating is about 8 now. Pt states she just needs to be aware.  Pt states she has been taking her ozempic now more consistently.   Pt denies binge eating behaviors.   Estimated daily fluid intake:  oz Supplements:  Current average weekly physical activity:   24-Hr Dietary Recall First Meal: 3 eggs + cheese + 2 pieces toast Snack:  Second Meal: 2 packs tuna + wheat bread Snack:  Third Meal: chicken wings + fries or spagetti Snack:  Beverages: water   Estimated Energy Needs Calories: 1500   NUTRITION DIAGNOSIS  Overweight/obesity ([place]-3.3) related to past poor dietary habits and physical inactivity as evidenced by patient w/ planned sleeve or RYGB surgery following dietary guidelines for continued weight loss.   NUTRITION INTERVENTION  Nutrition counseling (C-1) and education (E-2) to facilitate bariatric surgery goals.  Pre-Op Goals Progress & New Goals Continue: Track food and beverage intake (pen and paper, MyFitness Pal, Baritastic app, etc.): using the mindful meals sheet Continue: Set a timer for meals Continue: Call a eating disorder therapist today   Learning Style &  Readiness for Change Teaching method utilized: Visual & Auditory  Demonstrated degree of understanding via: Teach Back  Readiness Level: contemplative  Barriers to learning/adherence to lifestyle change: previous BED   MONITORING & EVALUATION Dietary intake, weekly physical activity, body weight, and pre-op goals  Next Steps  Patient is to return to [facility] for next appt Pt has completed visits. No further supervised visits required/recomended

## 2021-01-13 ENCOUNTER — Ambulatory Visit (INDEPENDENT_AMBULATORY_CARE_PROVIDER_SITE_OTHER): Payer: BC Managed Care – PPO | Admitting: Neurology

## 2021-01-13 DIAGNOSIS — R0601 Orthopnea: Secondary | ICD-10-CM

## 2021-01-13 DIAGNOSIS — Z6841 Body Mass Index (BMI) 40.0 and over, adult: Secondary | ICD-10-CM

## 2021-01-13 DIAGNOSIS — R7303 Prediabetes: Secondary | ICD-10-CM

## 2021-01-13 DIAGNOSIS — R0683 Snoring: Secondary | ICD-10-CM

## 2021-01-13 DIAGNOSIS — G4733 Obstructive sleep apnea (adult) (pediatric): Secondary | ICD-10-CM

## 2021-01-14 ENCOUNTER — Telehealth (INDEPENDENT_AMBULATORY_CARE_PROVIDER_SITE_OTHER): Payer: BC Managed Care – PPO | Admitting: Psychology

## 2021-01-14 DIAGNOSIS — F909 Attention-deficit hyperactivity disorder, unspecified type: Secondary | ICD-10-CM

## 2021-01-14 DIAGNOSIS — F5089 Other specified eating disorder: Secondary | ICD-10-CM

## 2021-01-14 NOTE — Progress Notes (Signed)
[facility] TEST REPORT ( by Watch PAT)   STUDY DATE:  01-14-2021 DOB:  November 02, 1980 MRN: [id number]   ORDERING CLINICIAN: Larey Seat, MD REFERRING CLINICIAN: Dr. Thermon Leyland, MD   CLINICAL INFORMATION/HISTORY: The patient reports that she has gained more weight over the last 4 years has developed knee pain shortness of breath also she does have very large breasts and this affects her posterior and breathing as well.  The patient is concerned that she needs bariatric surgery and would like to be evaluated for obstructive sleep apnea first.  She has tried Ozempic and Vyvanse which have not helped much with weight loss.  [facility]   The patient has a past medical history of degenerative back pain, gestational hypertension, preeclampsia and hypertension in the past as well as anxiety.  She reports nocturia 2-3 times at night and neck pain.  Family history is positive for her son who was diagnosed with apnea at age 64.   Epworth sleepiness score: 5/24.   BMI: 52.7kg/m   Neck Circumference: 16    Sleep Summary:   Total Recording Time  of 8 hours, 44 minutes.        Total Sleep Time of 7 hours and 53 minutes.:                 Percent REM (%):    24                                     Respiratory Indices:   Calculated pAHI (per hour): 9.2/h                           REM pAHI:   18.6/h                                              NREM pAHI:      6.7/h                        Supine AHI:    N/A                                               Oxygen Saturation Statistics:   Oxygen Saturation (%) Mean: Mean oxygen saturation was 95%, the minimum oxygenation was 86% the maximum 98%.                                                 O2 Saturation (minutes) <89%:0.2 min.           Pulse Rate Statistics:   Pulse Mean (bpm):  82               Pulse Range:    55- 115 bpm              IMPRESSION:  This HST confirms the presence of presence of  mild sleep apnea, not associated with hypoxia and strongly associated with REM sleep. High RDI indicated the presence of snoring.   RECOMMENDATION: this constellation of apnea is usually best served with CPAP, but weight loss can certainly help with reduction or alleviation of apnea. The data do not indicate obesity hypoventilation to be present unless the patient slept supine.   Auto CPAP will be prescribed at 6-16 cm water, 2 cm EPR and mask of choice, humidification degree of comfort    INTERPRETING PHYSICIAN:   Larey Seat, MD   Medical Director of [facility] at [facility].  Board certified by the [facility] and ABPN

## 2021-01-22 ENCOUNTER — Other Ambulatory Visit: Payer: Self-pay

## 2021-01-27 NOTE — Anesthesia Preprocedure Evaluation (Addendum)
Anesthesia Evaluation  Patient identified by MRN, date of birth, ID band Patient awake    Reviewed: Allergy & Precautions, NPO status , Patient's Chart, lab work & pertinent test results  History of Anesthesia Complications Negative for: history of anesthetic complications  Airway Mallampati: II  TM Distance: >3 FB Neck ROM: Full    Dental  (+) Missing,    Pulmonary neg pulmonary ROS,    Pulmonary exam normal        Cardiovascular hypertension, Normal cardiovascular exam     Neuro/Psych Anxiety Depression ADHD   GI/Hepatic Neg liver ROS, GERD  Controlled,  Endo/Other  Morbid obesity (BMI 54)  Renal/GU negative Renal ROS  negative genitourinary   Musculoskeletal negative musculoskeletal ROS (+)   Abdominal   Peds  Hematology negative hematology ROS (+)   Anesthesia Other Findings Day of surgery medications reviewed with patient.  Reproductive/Obstetrics negative OB ROS                            Anesthesia Physical Anesthesia Plan  ASA: 3  Anesthesia Plan: MAC   Post-op Pain Management:    Induction:   PONV Risk Score and Plan: Treatment may vary due to age or medical condition and Propofol infusion  Airway Management Planned: Natural Airway and Nasal Cannula  Additional Equipment:   Intra-op Plan:   Post-operative Plan:   Informed Consent: I have reviewed the patients History and Physical, chart, labs and discussed the procedure including the risks, benefits and alternatives for the proposed anesthesia with the patient or authorized representative who has indicated his/her understanding and acceptance.       Plan Discussed with: CRNA  Anesthesia Plan Comments:        Anesthesia Quick Evaluation

## 2021-01-28 ENCOUNTER — Ambulatory Visit (HOSPITAL_COMMUNITY): Payer: BC Managed Care – PPO | Admitting: Certified Registered Nurse Anesthetist

## 2021-01-28 ENCOUNTER — Ambulatory Visit (HOSPITAL_COMMUNITY)
Admission: RE | Admit: 2021-01-28 | Discharge: 2021-01-28 | Disposition: A | Discharge status: Home / Self Care | Payer: BC Managed Care – PPO | Attending: Surgery | Admitting: Surgery

## 2021-01-28 ENCOUNTER — Encounter (HOSPITAL_COMMUNITY): Payer: Self-pay | Admitting: Surgery

## 2021-01-28 ENCOUNTER — Other Ambulatory Visit: Payer: Self-pay

## 2021-01-28 ENCOUNTER — Encounter (HOSPITAL_COMMUNITY)
Admission: RE | Disposition: A | Discharge status: Home / Self Care | Payer: Self-pay | Source: Home / Self Care | Attending: Surgery

## 2021-01-28 DIAGNOSIS — Z01818 Encounter for other preprocedural examination: Secondary | ICD-10-CM | POA: Diagnosis present

## 2021-01-28 DIAGNOSIS — Z79899 Other long term (current) drug therapy: Secondary | ICD-10-CM | POA: Diagnosis not present

## 2021-01-28 DIAGNOSIS — K2970 Gastritis, unspecified, without bleeding: Secondary | ICD-10-CM | POA: Diagnosis not present

## 2021-01-28 DIAGNOSIS — Z6841 Body Mass Index (BMI) 40.0 and over, adult: Secondary | ICD-10-CM | POA: Insufficient documentation

## 2021-01-28 DIAGNOSIS — B9681 Helicobacter pylori [H. pylori] as the cause of diseases classified elsewhere: Secondary | ICD-10-CM | POA: Diagnosis not present

## 2021-01-28 DIAGNOSIS — Z789 Other specified health status: Secondary | ICD-10-CM

## 2021-01-28 HISTORY — PX: ESOPHAGOGASTRODUODENOSCOPY: SHX5428

## 2021-01-28 HISTORY — PX: BIOPSY: SHX5522

## 2021-01-28 LAB — PREGNANCY, URINE: Preg Test, Ur: NEGATIVE

## 2021-01-28 SURGERY — EGD (ESOPHAGOGASTRODUODENOSCOPY)
Anesthesia: Monitor Anesthesia Care

## 2021-01-28 MED ORDER — DEXMEDETOMIDINE (PRECEDEX) IN NS 20 MCG/5ML (4 MCG/ML) IV SYRINGE
PREFILLED_SYRINGE | INTRAVENOUS | Status: DC | PRN
  Administered 2021-01-28: 4 ug via INTRAVENOUS
  Administered 2021-01-28 (×2): 8 ug via INTRAVENOUS

## 2021-01-28 MED ORDER — LIDOCAINE 2% (20 MG/ML) 5 ML SYRINGE
INTRAMUSCULAR | Status: DC | PRN
  Administered 2021-01-28: 100 mg via INTRAVENOUS

## 2021-01-28 MED ORDER — LACTATED RINGERS IV SOLN: INTRAVENOUS | Status: DC

## 2021-01-28 MED ORDER — PROPOFOL 500 MG/50ML IV EMUL
INTRAVENOUS | Status: DC | PRN
  Administered 2021-01-28: 150 ug/kg/min via INTRAVENOUS
  Administered 2021-01-28: 40 mg via INTRAVENOUS

## 2021-01-28 MED ORDER — PANTOPRAZOLE SODIUM 40 MG PO TBEC: 40.0000 mg | DELAYED_RELEASE_TABLET | Freq: Every day | ORAL | 3 refills | Status: DC

## 2021-01-28 MED ORDER — GLYCOPYRROLATE PF 0.2 MG/ML IJ SOSY
PREFILLED_SYRINGE | INTRAMUSCULAR | Status: DC | PRN
  Administered 2021-01-28: .2 mg via INTRAVENOUS

## 2021-01-28 MED ORDER — PROPOFOL 500 MG/50ML IV EMUL
INTRAVENOUS | Status: AC
  Filled 2021-01-28: qty 50

## 2021-01-28 NOTE — Anesthesia Postprocedure Evaluation (Signed)
Anesthesia Post Note  Patient: Vanessa Wilcox  Procedure(s) Performed: ESOPHAGOGASTRODUODENOSCOPY (EGD) BIOPSY     Patient location during evaluation: PACU Anesthesia Type: MAC Level of consciousness: awake and alert and oriented Pain management: pain level controlled Vital Signs Assessment: post-procedure vital signs reviewed and stable Respiratory status: spontaneous breathing, nonlabored ventilation and respiratory function stable Cardiovascular status: blood pressure returned to baseline Postop Assessment: no apparent nausea or vomiting Anesthetic complications: no   No notable events documented.  Last Vitals:  Vitals:   01/28/21 0830 01/28/21 0840  BP: 135/81 122/73  Pulse: 97 94  Resp: 19 18  Temp:    SpO2: 100% 97%    Last Pain:  Vitals:   01/28/21 0840  TempSrc:   PainSc: 0-No pain                 Brennan Bailey

## 2021-01-28 NOTE — Transfer of Care (Signed)
Immediate Anesthesia Transfer of Care Note  Patient: Vanessa Wilcox  Procedure(s) Performed: ESOPHAGOGASTRODUODENOSCOPY (EGD) BIOPSY  Patient Location: PACU and Endoscopy Unit  Anesthesia Type:MAC  Level of Consciousness: awake, alert , oriented and patient cooperative  Airway & Oxygen Therapy: Patient Spontanous Breathing and Patient connected to face mask oxygen  Post-op Assessment: Report given to RN, Post -op Vital signs reviewed and stable and Patient moving all extremities  Post vital signs: Reviewed and stable  Last Vitals:  Vitals Value Taken Time  BP 139/89 01/28/21 0822  Temp    Pulse 99 01/28/21 0825  Resp 15 01/28/21 0825  SpO2 100 % 01/28/21 0825  Vitals shown include unvalidated device data.  Last Pain:  Vitals:   01/28/21 0707  TempSrc: Temporal  PainSc: 0-No pain         Complications: No notable events documented.

## 2021-01-28 NOTE — H&P (Signed)
Admitting Physician: [place]  Service: Bariatric Surgery  CC: EGD for bariatric surgery preoperative workup  Subjective   HPI: Vanessa Wilcox is an 40 y.o. female who is here for EGD for bariatric surgery preoperative workup.  Past Medical History:  Diagnosis Date   ADHD    Anxiety    Back pain    Depression    Family history of lung cancer    Family history of multiple myeloma    Gestational hypertension    High blood pressure    STD (sexually transmitted disease)    Tx'd for chlamydia over 20 years ago    Past Surgical History:  Procedure Laterality Date   ASPIRATION BIOPSY     fluid around heart- negative results in 2000   fluid on heart  2000   chest tube placed   IUD removal under sedation      Family History  Problem Relation Age of Onset   Diabetes Mother    High blood pressure Mother    Depression Mother    Sleep apnea Mother    Obesity Mother    Hypertension Mother    Hyperlipidemia Mother    Thyroid disease Mother    Lung cancer Mother 57   High Cholesterol Father    Cancer Father        unknown type - leg amputated   Alcoholism Father    Ovarian cancer Half-Sister 62       Dec ovarian Cancer   Breast cancer Half-Sister 55   Diabetes Maternal Grandmother    Stroke Maternal Grandfather    Multiple myeloma Cousin 18       in remission (maternal first cousin)    Social:  reports that she has never smoked. She has never used smokeless tobacco. She reports current alcohol use. She reports that she does not use drugs.  Allergies: No Known Allergies  Medications: Current Outpatient Medications  Medication Instructions   acetaminophen (TYLENOL) 1,000 mg, Oral, Every 6 hours PRN   ASHWAGANDHA PO 2 tablets, Oral, Every evening   lisdexamfetamine (VYVANSE) 20 mg, Oral, Daily   norethindrone (MICRONOR) 0.35 MG tablet 1 tablet, Oral, Daily   Ozempic (1 MG/DOSE) 1 mg, Subcutaneous, Weekly   Vitamin D (Ergocalciferol) (DRISDOL) 50,000  Units, Oral, Every 7 days    ROS - all of the below systems have been reviewed with the patient and positives are indicated with bold text General: chills, fever or night sweats Eyes: blurry vision or double vision ENT: epistaxis or sore throat Allergy/Immunology: itchy/watery eyes or nasal congestion Hematologic/Lymphatic: bleeding problems, blood clots or swollen lymph nodes Endocrine: temperature intolerance or unexpected weight changes Breast: new or changing breast lumps or nipple discharge Resp: cough, shortness of breath, or wheezing CV: chest pain or dyspnea on exertion GI: as per HPI GU: dysuria, trouble voiding, or hematuria MSK: joint pain or joint stiffness Neuro: TIA or stroke symptoms Derm: pruritus and skin lesion changes Psych: anxiety and depression  Objective   PE Blood pressure (!) 168/85, pulse 76, temperature 98.4 F (36.9 C), temperature source Temporal, resp. rate (!) 23, height 5' 2.5" (1.588 m), weight 136.1 kg, SpO2 99 %. Constitutional: NAD; conversant; no deformities Eyes: Moist conjunctiva; no lid lag; anicteric; PERRL Neck: Trachea midline; no thyromegaly Lungs: Normal respiratory effort; no tactile fremitus CV: RRR; no palpable thrills; no pitting edema GI: Abd Soft, obese, non-tender; no palpable hepatosplenomegaly MSK: Normal range of motion of extremities; no clubbing/cyanosis Psychiatric: Appropriate affect; alert and oriented  x3 Lymphatic: No palpable cervical or axillary lymphadenopathy  Results for orders placed or performed during the hospital encounter of 01/28/21 (from the past 24 hour(s))  Pregnancy, urine     Status: None   Collection Time: 01/28/21  7:10 AM  Result Value Ref Range   Preg Test, Ur NEGATIVE NEGATIVE    Imaging Orders  No imaging studies ordered today     Assessment and Plan   Vanessa Wilcox is an 40 y.o. female with morbid obesity BMI 54, here for preoperative EGD.  The procedure itself as well as its risks,  benefits and alternatives were discussed and the patient granted consent to proceed.  We will proceed as scheduled.  Felicie Morn, MD  [facility] Use [email or web address] to contact on call provider

## 2021-01-28 NOTE — Discharge Instructions (Signed)
YOU HAD AN ENDOSCOPIC PROCEDURE TODAY: Refer to the procedure report and other information in the discharge instructions given to you for any specific questions about what was found during the examination.  YOU SHOULD EXPECT: Some feelings of bloating in the abdomen. Passage of more gas than usual. Walking can help get rid of the air that was put into your GI tract during the procedure and reduce the bloating. If you had a lower endoscopy (such as a colonoscopy or flexible sigmoidoscopy) you may notice spotting of blood in your stool or on the toilet paper. Some abdominal soreness may be present for a day or two, also.  DIET: Your first meal following the procedure should be a light meal and then it is ok to progress to your normal diet. A half-sandwich or bowl of soup is an example of a good first meal. Heavy or fried foods are harder to digest and may make you feel nauseous or bloated. Drink plenty of fluids but you should avoid alcoholic beverages for 24 hours. If you had a esophageal dilation, please see attached instructions for diet.    ACTIVITY: Your care partner should take you home directly after the procedure. You should plan to take it easy, moving slowly for the rest of the day. You can resume normal activity the day after the procedure however YOU SHOULD NOT DRIVE, use power tools, machinery or perform tasks that involve climbing or major physical exertion for 24 hours (because of the sedation medicines used during the test).   SYMPTOMS TO REPORT IMMEDIATELY::   Following upper endoscopy (EGD, EUS, ERCP, esophageal dilation) Vomiting of blood or coffee ground material  New, significant abdominal pain  New, significant chest pain or pain under the shoulder blades  Painful or persistently difficult swallowing  New shortness of breath  Black, tarry-looking or red, bloody stools

## 2021-01-28 NOTE — Op Note (Signed)
[facility] Patient Name: Vanessa Wilcox Procedure Date: 01/28/2021 MRN: [id number] Attending MD: Felicie Morn ,  Date of Birth: Jan 25, 1981 CSN: [id number] Age: 40 Admit Type: Outpatient Procedure:                Upper GI endoscopy Indications:               Providers:                Felicie Morn, Burtis Junes, RN, Cherylynn Ridges,                            Technician Referring MD:              Medicines:                Monitored Anesthesia Care Complications:            No immediate complications. Estimated Blood Loss:     Estimated blood loss was minimal. Procedure:                Pre-Anesthesia Assessment:                           - Prior to the procedure, a History and Physical                            was performed, and patient medications and                            allergies were reviewed. The patient is competent.                            The risks and benefits of the procedure and the                            sedation options and risks were discussed with the                            patient. All questions were answered and informed                            consent was obtained. Patient identification and                            proposed procedure were verified in the                            pre-procedure area. Mental Status Examination:                            alert and oriented. CV Examination: normal.                            Prophylactic Antibiotics: The patient does not                            require prophylactic antibiotics. Prior  Anticoagulants: The patient has taken no previous                            anticoagulant or antiplatelet agents. ASA Grade                            Assessment: II - A patient with mild systemic                            disease. After reviewing the risks and benefits,                            the patient was deemed in satisfactory condition to                             undergo the procedure. The anesthesia plan was to                            use monitored anesthesia care (MAC). Immediately                            prior to administration of medications, the patient                            was re-assessed for adequacy to receive sedatives.                            The heart rate, respiratory rate, oxygen                            saturations, blood pressure, adequacy of pulmonary                            ventilation, and response to care were monitored                            throughout the procedure. The physical status of                            the patient was re-assessed after the procedure.                           After obtaining informed consent, the endoscope was                            passed under direct vision. Throughout the                            procedure, the patient's blood pressure, pulse, and                            oxygen saturations were monitored continuously. The  GIF-H190 ([id number]) was introduced through the                            mouth, and advanced to the second part of duodenum.                            The upper GI endoscopy was accomplished without                            difficulty. The patient tolerated the procedure                            well. Scope In: Scope Out: Findings:      The examined esophagus was normal.      The gastroesophageal flap valve was visualized endoscopically and       classified as Hill Grade II (fold present, opens with respiration).      Patchy moderate inflammation characterized by erythema was found in the       prepyloric region of the stomach. Biopsies were taken with a cold       forceps for Helicobacter pylori testing. Verification of patient       identification for the specimen was done. Estimated blood loss was       minimal.      The in the duodenum was normal. Impression:               - Normal  esophagus.                           - Gastroesophageal flap valve classified as Hill                            Grade II (fold present, opens with respiration).                           - Gastritis. Biopsied.                           - Normal. Moderate Sedation:      Moderate (conscious) sedation was personally administered by an       anesthesia professional. The following parameters were monitored: oxygen       saturation, heart rate, blood pressure, and response to care. Total       physician intraservice time was 15 minutes. Recommendation:           - Discharge patient to home.                           - Start PPI preoperatively for gastritis.                           - Follow up H. Pylori results                           - Patient's anatomy appears appropriate for robotic  gastric bypass. Procedure Code(s):        --- Professional ---                           [phone], Esophagogastroduodenoscopy, flexible,                            transoral; with biopsy, single or multiple Diagnosis Code(s):        --- Professional ---                           K29.70, Gastritis, unspecified, without bleeding CPT copyright 2019 American Medical Association. All rights reserved. The codes documented in this report are preliminary and upon coder review may  be revised to meet current compliance requirements. Adelino,  01/28/2021 8:24:27 AM This report has been signed electronically. Number of Addenda: 0

## 2021-01-29 ENCOUNTER — Telehealth: Payer: Self-pay | Admitting: Neurology

## 2021-01-29 LAB — SURGICAL PATHOLOGY

## 2021-01-29 NOTE — Telephone Encounter (Signed)
Vanessa Wilcox @ [facility] is asking for a call with results to pt's sleep study so they can go about scheduling pt for her Bariatric weight loss surgery.  If Vanessa Wilcox is not available she is asking that a message  be left

## 2021-01-29 NOTE — Progress Notes (Signed)
The HST revealed mild apnea without hypoxia, loud snoring, and REM sleep exacerbation but not REM sleep dependency. CPAP order filed.   IMPRESSION:  This HST confirms the presence of presence of mild sleep apnea, not associated with hypoxia and strongly associated with REM sleep. High RDI indicated the presence of snoring.  RECOMMENDATION: this constellation of apnea is usually best served with CPAP, but weight loss can certainly help with reduction or alleviation of apnea. The data do not indicate obesity hypoventilation to be present unless the patient slept supine.  Auto CPAP will be prescribed at 6-16 cm water, 2 cm EPR and mask of choice, and a humidification degree of patient's comfort

## 2021-01-29 NOTE — Procedures (Signed)
[facility] TEST REPORT ( by Watch PAT)   STUDY DATE:  01-14-2021 DOB:  10-May-1981 MRN: [id number]   ORDERING CLINICIAN: Larey Seat, MD REFERRING CLINICIAN: Dr. Thermon Leyland, MD   CLINICAL INFORMATION/HISTORY: The patient reports that she has gained more weight over the last 4 years has developed knee pain shortness of breath also she does have very large breasts and this affects her posterior and breathing as well.  The patient is concerned that she needs bariatric surgery and would like to be evaluated for obstructive sleep apnea first.  She has tried Ozempic and Vyvanse which have not helped much with weight loss.  [facility]   The patient has a past medical history of degenerative back pain, gestational hypertension, preeclampsia and hypertension in the past as well as anxiety.  She reports nocturia 2-3 times at night and neck pain.  Family history is positive for her son who was diagnosed with apnea at age 60.   Epworth sleepiness score: 5/24.   BMI: 52.7kg/m   Neck Circumference: 16    Sleep Summary:   Total Recording Time  of 8 hours, 44 minutes.        Total Sleep Time of 7 hours and 53 minutes.:                 Percent REM (%):    24                                     Respiratory Indices:   Calculated pAHI (per hour): 9.2/h                           REM pAHI:   18.6/h                                              NREM pAHI:      6.7/h                        Supine AHI:    N/A                                               Oxygen Saturation Statistics:   Oxygen Saturation (%) Mean: Mean oxygen saturation was 95%, the minimum oxygenation was 86% the maximum 98%.                                                 O2 Saturation (minutes) <89%:0.2 min.           Pulse Rate Statistics:   Pulse Mean (bpm):  82               Pulse Range:    55- 115 bpm              IMPRESSION:  This HST confirms the presence of presence of  mild sleep apnea, not associated with hypoxia and strongly associated with REM sleep. High RDI indicated the presence of snoring.   RECOMMENDATION: this constellation of apnea is usually best served with CPAP, but weight loss can certainly help with reduction or alleviation of apnea. The data do not indicate obesity hypoventilation to be present unless the patient slept supine.   Auto CPAP will be prescribed at 6-16 cm water, 2 cm EPR and mask of choice, humidification degree of comfort    INTERPRETING PHYSICIAN:   Larey Seat, MD   Medical Director of [facility].  Board certified by the [facility] and ABPN

## 2021-01-29 NOTE — Telephone Encounter (Signed)
Called the patient and reviewed the SSR and also made April aware of the results. Informed of the mild apnea. Recommendation is weight loss/auto cpap. Pt made aware and she call if decides to pursue cpap

## 2021-01-29 NOTE — Addendum Note (Signed)
Addended by: Larey Seat on: 01/29/2021 05:00 PM   Modules accepted: Orders

## 2021-02-05 ENCOUNTER — Encounter (INDEPENDENT_AMBULATORY_CARE_PROVIDER_SITE_OTHER): Payer: Self-pay | Admitting: Family Medicine

## 2021-02-05 ENCOUNTER — Other Ambulatory Visit: Payer: Self-pay

## 2021-02-05 ENCOUNTER — Ambulatory Visit (INDEPENDENT_AMBULATORY_CARE_PROVIDER_SITE_OTHER): Payer: BC Managed Care – PPO | Admitting: Family Medicine

## 2021-02-05 VITALS — BP 130/82 | HR 82 | Temp 98.2°F | Ht 63.0 in | Wt 291.0 lb

## 2021-02-05 DIAGNOSIS — R632 Polyphagia: Secondary | ICD-10-CM | POA: Diagnosis not present

## 2021-02-05 DIAGNOSIS — E8881 Metabolic syndrome: Secondary | ICD-10-CM

## 2021-02-05 DIAGNOSIS — Z6841 Body Mass Index (BMI) 40.0 and over, adult: Secondary | ICD-10-CM

## 2021-02-05 DIAGNOSIS — Z9189 Other specified personal risk factors, not elsewhere classified: Secondary | ICD-10-CM

## 2021-02-05 DIAGNOSIS — G4733 Obstructive sleep apnea (adult) (pediatric): Secondary | ICD-10-CM

## 2021-02-06 MED ORDER — OZEMPIC (1 MG/DOSE) 4 MG/3ML ~~LOC~~ SOPN: 1.0000 mg | PEN_INJECTOR | SUBCUTANEOUS | 0 refills | Status: DC

## 2021-02-11 NOTE — Progress Notes (Signed)
Chief Complaint:   OBESITY Vanessa Wilcox is here to discuss her progress with her obesity treatment plan along with follow-up of her obesity related diagnoses.   Today's visit was #: 28 Starting weight: 306 lbs Starting date: 02/09/2019 Today's weight: 291 lbs Today's date: 02/05/2021 Weight change since last visit: 1 lbs Total lbs lost to date: 15 lbs Body mass index is 51.55 kg/m.  Total weight loss percentage to date: -4.90%  Interim History:  [name] says she is anticipating bariatric surgery next month.  We reviewed her sleep study and EGD results. Current Meal Plan: keeping a food journal and adhering to recommended goals of 1300 calories and 95 grams of protein for 40% of the time.  Current Exercise Plan: Cardio and strength training for 40 minutes 3 times per week. Current Anti-Obesity Medications: Ozempic 1 mg subcutaneously weekly. Side effects: None.  Assessment/Plan:   1. Metabolic syndrome Starting goal: Lose 7-10% of starting weight. She will continue to focus on protein-rich, low simple carbohydrate foods. We reviewed the importance of hydration, regular exercise for stress reduction, and restorative sleep.  We will continue to check lab work every 3 months, with 10% weight loss, or should any other concerns arise.  - Refill Semaglutide, 1 MG/DOSE, (OZEMPIC, 1 MG/DOSE,) 4 MG/3ML SOPN; Inject 1 mg into the skin once a week.  Dispense: 3 mL; Refill: 0  2. Polyphagia Improving but not optimized. Current treatment: Ozempic 1 mg subcutaneously weekly. She will continue to focus on protein-rich, low simple carbohydrate foods. We reviewed the importance of hydration, regular exercise for stress reduction, and restorative sleep.  3. OSA (obstructive sleep apnea), mild OSA is a cause of systemic hypertension and is associated with an increased incidence of stroke, heart failure, atrial fibrillation, and coronary heart disease. Severe OSA increases all-cause mortality and  cardiovascular mortality.   Goal: Treatment of OSA via CPAP compliance and weight loss. Plasma ghrelin levels (appetite or "hunger hormone") are significantly higher in OSA patients than in BMI-matched controls, but decrease to levels similar to those of obese patients without OSA after CPAP treatment.  Weight loss improves OSA by several mechanisms, including reduction in fatty tissue in the throat (i.e. parapharyngeal fat) and the tongue. Loss of abdominal fat increases mediastinal traction on the upper airway making it less likely to collapse during sleep. Studies have also shown that compliance with CPAP treatment improves leptin (hunger inhibitory hormone) imbalance.  4. At risk for heart disease Due to [name]'s current state of health and medical condition(s), she is at a higher risk for heart disease.  This puts the patient at much greater risk to subsequently develop cardiopulmonary conditions that can significantly affect patient's quality of life in a negative manner.    At least 8 minutes were spent on counseling [name] about these concerns today. Evidence-based interventions for health behavior change were utilized today including the discussion of self monitoring techniques, problem-solving barriers, and SMART goal setting techniques.  Specifically, regarding patient's less desirable eating habits and patterns, we employed the technique of small changes when [name] has not been able to fully commit to her prudent nutritional plan.  5. Obesity, current BMI 51.7  Course: Anahi is currently in the action stage of change. As such, her goal is to continue with weight loss efforts.   Nutrition goals: She has agreed to keeping a food journal and adhering to recommended goals of 1300 calories and 95 grams of protein.   Exercise goals:  As is.  Behavioral  modification strategies: increasing lean protein intake, decreasing simple carbohydrates, increasing vegetables, and increasing water  intake.  [name] has agreed to follow-up with our clinic in 4 weeks. She was informed of the importance of frequent follow-up visits to maximize her success with intensive lifestyle modifications for her multiple health conditions.   Objective:   Blood pressure 130/82, pulse 82, temperature 98.2 F (36.8 C), temperature source Oral, height '5\' 3"'$  (1.6 m), weight 291 lb (132 kg), SpO2 94 %. Body mass index is 51.55 kg/m.  General: Cooperative, alert, well developed, in no acute distress. HEENT: Conjunctivae and lids unremarkable. Cardiovascular: Regular rhythm.  Lungs: Normal work of breathing. Neurologic: No focal deficits.   Lab Results  Component Value Date   CREATININE 0.91 05/24/2020   BUN 8 05/24/2020   NA 139 05/24/2020   K 4.1 05/24/2020   CL 103 05/24/2020   CO2 28 05/24/2020   Lab Results  Component Value Date   ALT 13 05/24/2020   AST 17 05/24/2020   ALKPHOS 55 05/24/2020   BILITOT 0.4 05/24/2020   Lab Results  Component Value Date   HGBA1C 5.5 05/28/2020   HGBA1C 5.7 (H) 02/09/2019   Lab Results  Component Value Date   INSULIN 10.6 05/28/2020   INSULIN 21.5 02/09/2019   Lab Results  Component Value Date   VD25OH 29.9 (L) 05/28/2020   VD25OH 7.6 (L) 02/09/2019   Lab Results  Component Value Date   WBC 14.3 (H) 05/24/2020   HGB 13.9 05/24/2020   HCT 43.8 05/24/2020   MCV 83.7 05/24/2020   PLT 239 05/24/2020   Attestation Statements:   Reviewed by clinician on day of visit: allergies, medications, problem list, medical history, surgical history, family history, social history, and previous encounter notes.  I, Water quality scientist, CMA, am acting as transcriptionist for Briscoe Deutscher, DO  I have reviewed the above documentation for accuracy and completeness, and I agree with the above. Briscoe Deutscher, DO

## 2021-02-17 ENCOUNTER — Encounter: Payer: BC Managed Care – PPO | Attending: Surgery | Admitting: Skilled Nursing Facility1

## 2021-02-17 ENCOUNTER — Other Ambulatory Visit: Payer: Self-pay

## 2021-02-17 DIAGNOSIS — E669 Obesity, unspecified: Secondary | ICD-10-CM | POA: Insufficient documentation

## 2021-02-18 NOTE — Progress Notes (Signed)
Pre-Operative Nutrition Class:    Patient was seen on 02/17/2021 for Pre-Operative Bariatric Surgery Education at [facility].    Surgery date:  Surgery type: sleeve or RYGB Start weight at [facility]: 297.9 pounds Weight today: 298.4 pounds  Samples given per MNT protocol. Patient educated on appropriate usage: Bariatric Advantage Multivitamin Lot # [id number] Exp: 08/23   Procare Calcium  Lot # [id number] Exp: 03/23   Bariatric Advantage protein powder Lot # [id number] Exp: 10/23  The following the learning objectives were met by the patient during this course: Identify Pre-Op Dietary Goals and will begin 2 weeks pre-operatively Identify appropriate sources of fluids and proteins  State protein recommendations and appropriate sources pre and post-operatively Identify Post-Operative Dietary Goals and will follow for 2 weeks post-operatively Identify appropriate multivitamin and calcium sources Describe the need for physical activity post-operatively and will follow MD recommendations State when to call healthcare provider regarding medication questions or post-operative complications When having a diagnosis of diabetes understanding hypoglycemia symptoms and the inclusion of 1 complex carbohydrate per meal  Handouts given during class include: Pre-Op Bariatric Surgery Diet Handout Protein Shake Handout Post-Op Bariatric Surgery Nutrition Handout BELT Program Information Flyer Support Group Information Flyer [facility] Bariatric Supplements Price List  Follow-Up Plan: Patient will follow-up at [facility] 2 weeks post operatively for diet advancement per MD.

## 2021-02-21 NOTE — Progress Notes (Signed)
Please enter orders for PAT visit scheduled for 02-25-21.

## 2021-02-22 ENCOUNTER — Ambulatory Visit: Payer: Self-pay | Admitting: Surgery

## 2021-02-24 NOTE — Progress Notes (Signed)
DUE TO COVID-19 ONLY ONE VISITOR IS ALLOWED TO COME WITH YOU AND STAY IN THE WAITING ROOM ONLY DURING PRE OP AND PROCEDURE DAY OF SURGERY.  2 VISITOR  MAY VISIT WITH YOU AFTER SURGERY IN YOUR PRIVATE ROOM DURING VISITING HOURS ONLY!  YOU NEED TO HAVE A COVID 19 TEST ON_  02/28/21 _____'@_'$  '@_from'$  8am-3pm _____, THIS TEST MUST BE DONE BEFORE SURGERY,  Covid test is done at [place], [place] [address].  This is a drive thru.  No appt required. Please see map.                 Your procedure is scheduled on:  03/04/2021   Report to [facility] Main  Entrance   Report to admitting at     0830AM     Call this number if you have problems the morning of surgery [phone]    REMEMBER: NO  SOLID FOOD CANDY OR GUM AFTER MIDNIGHT. CLEAR LIQUIDS UNTIL   0745am        . NOTHING BY MOUTH EXCEPT CLEAR LIQUIDS UNTIL 0745am    . PLEASE FINISH ENSURE DRINK PER SURGEON ORDER  WHICH NEEDS TO BE COMPLETED AT   0745am    .      CLEAR LIQUID DIET   Foods Allowed                                                                    Coffee and tea, regular and decaf                            Fruit ices (not with fruit pulp)                                      Iced Popsicles                                    Carbonated beverages, regular and diet                                    Cranberry, grape and apple juices Sports drinks like Gatorade Lightly seasoned clear broth or consume(fat free) Sugar, honey syrup ___________________________________________________________________      BRUSH YOUR TEETH MORNING OF SURGERY AND RINSE YOUR MOUTH OUT, NO CHEWING GUM CANDY OR MINTS.     Take these medicines the morning of surgery with A SIP OF WATER:  protonix  DO NOT TAKE ANY DIABETIC MEDICATIONS DAY OF YOUR SURGERY                               You may not have any metal on your body including hair pins and              piercings  Do not wear jewelry, make-up, lotions, powders or  perfumes, deodorant             Do  not wear nail polish on your fingernails.  Do not shave  48 hours prior to surgery.              Men may shave face and neck.   Do not bring valuables to the hospital. [place].  Contacts, dentures or bridgework may not be worn into surgery.  Leave suitcase in the car. After surgery it may be brought to your room.     Patients discharged the day of surgery will not be allowed to drive home. IF YOU ARE HAVING SURGERY AND GOING HOME THE SAME DAY, YOU MUST HAVE AN ADULT TO DRIVE YOU HOME AND BE WITH YOU FOR 24 HOURS. YOU MAY GO HOME BY TAXI OR UBER OR ORTHERWISE, BUT AN ADULT MUST ACCOMPANY YOU HOME AND STAY WITH YOU FOR 24 HOURS.  Name and phone number of your driver:  Special Instructions: N/A              Please read over the following fact sheets you were given: _____________________________________________________________________  [facility] - Preparing for Surgery Before surgery, you can play an important role.  Because skin is not sterile, your skin needs to be as free of germs as possible.  You can reduce the number of germs on your skin by washing with CHG (chlorahexidine gluconate) soap before surgery.  CHG is an antiseptic cleaner which kills germs and bonds with the skin to continue killing germs even after washing. Please DO NOT use if you have an allergy to CHG or antibacterial soaps.  If your skin becomes reddened/irritated stop using the CHG and inform your nurse when you arrive at Short Stay. Do not shave (including legs and underarms) for at least 48 hours prior to the first CHG shower.  You may shave your face/neck. Please follow these instructions carefully:  1.  Shower with CHG Soap the night before surgery and the  morning of Surgery.  2.  If you choose to wash your hair, wash your hair first as usual with your  normal  shampoo.  3.  After you shampoo, rinse your hair and body thoroughly  to remove the  shampoo.                           4.  Use CHG as you would any other liquid soap.  You can apply chg directly  to the skin and wash                       Gently with a scrungie or clean washcloth.  5.  Apply the CHG Soap to your body ONLY FROM THE NECK DOWN.   Do not use on face/ open                           Wound or open sores. Avoid contact with eyes, ears mouth and genitals (private parts).                       Wash face,  Genitals (private parts) with your normal soap.             6.  Wash thoroughly, paying special attention to the area where your surgery  will be performed.  7.  Thoroughly rinse your body with warm  water from the neck down.  8.  DO NOT shower/wash with your normal soap after using and rinsing off  the CHG Soap.                9.  Pat yourself dry with a clean towel.            10.  Wear clean pajamas.            11.  Place clean sheets on your bed the night of your first shower and do not  sleep with pets. Day of Surgery : Do not apply any lotions/deodorants the morning of surgery.  Please wear clean clothes to the hospital/surgery center.  FAILURE TO FOLLOW THESE INSTRUCTIONS MAY RESULT IN THE CANCELLATION OF YOUR SURGERY PATIENT SIGNATURE_________________________________  NURSE SIGNATURE__________________________________  ________________________________________________________________________

## 2021-02-25 ENCOUNTER — Other Ambulatory Visit: Payer: Self-pay

## 2021-02-25 ENCOUNTER — Encounter (HOSPITAL_COMMUNITY): Payer: Self-pay

## 2021-02-25 ENCOUNTER — Encounter (HOSPITAL_COMMUNITY)
Admission: RE | Admit: 2021-02-25 | Discharge: 2021-02-25 | Disposition: A | Discharge status: Home / Self Care | Payer: BC Managed Care – PPO | Source: Ambulatory Visit | Attending: Surgery | Admitting: Surgery

## 2021-02-25 DIAGNOSIS — Z01812 Encounter for preprocedural laboratory examination: Secondary | ICD-10-CM | POA: Insufficient documentation

## 2021-02-25 HISTORY — DX: Prediabetes: R73.03

## 2021-02-25 LAB — CBC WITH DIFFERENTIAL/PLATELET
Abs Immature Granulocytes: 0.02 10*3/uL (ref 0.00–0.07)
Basophils Absolute: 0 10*3/uL (ref 0.0–0.1)
Basophils Relative: 1 %
Eosinophils Absolute: 0.1 10*3/uL (ref 0.0–0.5)
Eosinophils Relative: 1 %
HCT: 42.3 % (ref 36.0–46.0)
Hemoglobin: 13.6 g/dL (ref 12.0–15.0)
Immature Granulocytes: 0 %
Lymphocytes Relative: 31 %
Lymphs Abs: 2.1 10*3/uL (ref 0.7–4.0)
MCH: 26.9 pg (ref 26.0–34.0)
MCHC: 32.2 g/dL (ref 30.0–36.0)
MCV: 83.6 fL (ref 80.0–100.0)
Monocytes Absolute: 0.4 10*3/uL (ref 0.1–1.0)
Monocytes Relative: 6 %
Neutro Abs: 4.1 10*3/uL (ref 1.7–7.7)
Neutrophils Relative %: 61 %
Platelets: 239 10*3/uL (ref 150–400)
RBC: 5.06 MIL/uL (ref 3.87–5.11)
RDW: 14.5 % (ref 11.5–15.5)
WBC: 6.7 10*3/uL (ref 4.0–10.5)
nRBC: 0 % (ref 0.0–0.2)

## 2021-02-25 LAB — COMPREHENSIVE METABOLIC PANEL
ALT: 20 U/L (ref 0–44)
AST: 20 U/L (ref 15–41)
Albumin: 3.9 g/dL (ref 3.5–5.0)
Alkaline Phosphatase: 62 U/L (ref 38–126)
Anion gap: 5 (ref 5–15)
BUN: 20 mg/dL (ref 6–20)
CO2: 26 mmol/L (ref 22–32)
Calcium: 9.2 mg/dL (ref 8.9–10.3)
Chloride: 104 mmol/L (ref 98–111)
Creatinine, Ser: 0.78 mg/dL (ref 0.44–1.00)
GFR, Estimated: >60 mL/min (ref >60)
Glucose, Bld: 94 mg/dL (ref 70–99)
Potassium: 4 mmol/L (ref 3.5–5.1)
Sodium: 135 mmol/L (ref 135–145)
Total Bilirubin: 0.7 mg/dL (ref 0.3–1.2)
Total Protein: 7.3 g/dL (ref 6.5–8.1)

## 2021-02-25 LAB — GLUCOSE, CAPILLARY: Glucose-Capillary: 81 mg/dL (ref 70–99)

## 2021-02-25 LAB — HEMOGLOBIN A1C
Hgb A1c MFr Bld: 5.5 % (ref 4.8–5.6)
Mean Plasma Glucose: 111.15 mg/dL

## 2021-02-25 NOTE — Progress Notes (Addendum)
Anesthesia Review:  [id number] Sun 02/05/21- LOV - Weight Management [place]  Cardiologist : none  Chest x-ray : 12/04/20-2v  EKG : 11/26/20  Echo : Stress test: Cardiac Cath :  Activity level: can do a flight of stairs without difficulty  Sleep Study/ CPAP : none  Fasting Blood Sugar :      / Checks Blood Sugar -- times a day:   Blood Thinner/ Instructions /Last Dose: ASA / Instructions/ Last Dose :  Prediabetic does not check glucose at home on no meds  Hgba1c-02/25/21-  5.5  Covid test 02/28/21.

## 2021-02-28 ENCOUNTER — Other Ambulatory Visit: Payer: Self-pay | Admitting: Surgery

## 2021-03-01 LAB — SARS CORONAVIRUS 2 (TAT 6-24 HRS): SARS Coronavirus 2: NEGATIVE

## 2021-03-03 MED ORDER — BUPIVACAINE LIPOSOME 1.3 % IJ SUSP
20.0000 mL | Freq: Once | INTRAMUSCULAR | Status: DC
  Filled 2021-03-03: qty 20

## 2021-03-04 ENCOUNTER — Other Ambulatory Visit: Payer: Self-pay

## 2021-03-04 ENCOUNTER — Inpatient Hospital Stay (HOSPITAL_COMMUNITY)
Admission: RE | Admit: 2021-03-04 | Discharge: 2021-03-09 | DRG: 621 | Disposition: A | Discharge status: Home / Self Care | Payer: BC Managed Care – PPO | Attending: Surgery | Admitting: Surgery

## 2021-03-04 ENCOUNTER — Inpatient Hospital Stay (HOSPITAL_COMMUNITY): Payer: BC Managed Care – PPO | Admitting: Certified Registered Nurse Anesthetist

## 2021-03-04 ENCOUNTER — Encounter (HOSPITAL_COMMUNITY): Payer: Self-pay | Admitting: Surgery

## 2021-03-04 ENCOUNTER — Encounter (HOSPITAL_COMMUNITY)
Admission: RE | Disposition: A | Discharge status: Home / Self Care | Payer: Self-pay | Source: Home / Self Care | Attending: Surgery

## 2021-03-04 DIAGNOSIS — F909 Attention-deficit hyperactivity disorder, unspecified type: Secondary | ICD-10-CM | POA: Diagnosis present

## 2021-03-04 DIAGNOSIS — Z8249 Family history of ischemic heart disease and other diseases of the circulatory system: Secondary | ICD-10-CM | POA: Diagnosis not present

## 2021-03-04 DIAGNOSIS — R1310 Dysphagia, unspecified: Secondary | ICD-10-CM | POA: Diagnosis not present

## 2021-03-04 DIAGNOSIS — R609 Edema, unspecified: Secondary | ICD-10-CM | POA: Diagnosis not present

## 2021-03-04 DIAGNOSIS — F419 Anxiety disorder, unspecified: Secondary | ICD-10-CM | POA: Diagnosis present

## 2021-03-04 DIAGNOSIS — Z811 Family history of alcohol abuse and dependence: Secondary | ICD-10-CM | POA: Diagnosis not present

## 2021-03-04 DIAGNOSIS — Z79899 Other long term (current) drug therapy: Secondary | ICD-10-CM

## 2021-03-04 DIAGNOSIS — K219 Gastro-esophageal reflux disease without esophagitis: Secondary | ICD-10-CM | POA: Diagnosis present

## 2021-03-04 DIAGNOSIS — Z6841 Body Mass Index (BMI) 40.0 and over, adult: Secondary | ICD-10-CM

## 2021-03-04 DIAGNOSIS — F32A Depression, unspecified: Secondary | ICD-10-CM | POA: Diagnosis present

## 2021-03-04 DIAGNOSIS — R112 Nausea with vomiting, unspecified: Secondary | ICD-10-CM | POA: Diagnosis not present

## 2021-03-04 DIAGNOSIS — R000 Tachycardia, unspecified: Secondary | ICD-10-CM | POA: Diagnosis not present

## 2021-03-04 DIAGNOSIS — I10 Essential (primary) hypertension: Secondary | ICD-10-CM | POA: Diagnosis present

## 2021-03-04 DIAGNOSIS — Z833 Family history of diabetes mellitus: Secondary | ICD-10-CM | POA: Diagnosis not present

## 2021-03-04 DIAGNOSIS — Z807 Family history of other malignant neoplasms of lymphoid, hematopoietic and related tissues: Secondary | ICD-10-CM | POA: Diagnosis not present

## 2021-03-04 DIAGNOSIS — Z8349 Family history of other endocrine, nutritional and metabolic diseases: Secondary | ICD-10-CM | POA: Diagnosis not present

## 2021-03-04 DIAGNOSIS — Z8041 Family history of malignant neoplasm of ovary: Secondary | ICD-10-CM | POA: Diagnosis not present

## 2021-03-04 DIAGNOSIS — Z801 Family history of malignant neoplasm of trachea, bronchus and lung: Secondary | ICD-10-CM | POA: Diagnosis not present

## 2021-03-04 DIAGNOSIS — Z823 Family history of stroke: Secondary | ICD-10-CM

## 2021-03-04 DIAGNOSIS — Z803 Family history of malignant neoplasm of breast: Secondary | ICD-10-CM | POA: Diagnosis not present

## 2021-03-04 DIAGNOSIS — Z83438 Family history of other disorder of lipoprotein metabolism and other lipidemia: Secondary | ICD-10-CM | POA: Diagnosis not present

## 2021-03-04 DIAGNOSIS — Z9889 Other specified postprocedural states: Secondary | ICD-10-CM

## 2021-03-04 DIAGNOSIS — R7303 Prediabetes: Secondary | ICD-10-CM | POA: Diagnosis present

## 2021-03-04 DIAGNOSIS — K9189 Other postprocedural complications and disorders of digestive system: Secondary | ICD-10-CM

## 2021-03-04 DIAGNOSIS — Z9884 Bariatric surgery status: Secondary | ICD-10-CM

## 2021-03-04 DIAGNOSIS — Z818 Family history of other mental and behavioral disorders: Secondary | ICD-10-CM | POA: Diagnosis not present

## 2021-03-04 HISTORY — PX: UPPER GI ENDOSCOPY: SHX6162

## 2021-03-04 HISTORY — PX: ROUX-EN-Y GASTRIC BYPASS: SHX1104

## 2021-03-04 LAB — ABO/RH: ABO/RH(D): B POS

## 2021-03-04 LAB — CBC
HCT: 44.6 % (ref 36.0–46.0)
Hemoglobin: 14.1 g/dL (ref 12.0–15.0)
MCH: 26.8 pg (ref 26.0–34.0)
MCHC: 31.6 g/dL (ref 30.0–36.0)
MCV: 84.6 fL (ref 80.0–100.0)
Platelets: 275 10*3/uL (ref 150–400)
RBC: 5.27 MIL/uL — ABNORMAL HIGH (ref 3.87–5.11)
RDW: 14.3 % (ref 11.5–15.5)
WBC: 15.8 10*3/uL — ABNORMAL HIGH (ref 4.0–10.5)
nRBC: 0 % (ref 0.0–0.2)

## 2021-03-04 LAB — CREATININE, SERUM
Creatinine, Ser: 0.85 mg/dL (ref 0.44–1.00)
GFR, Estimated: >60 mL/min (ref >60)

## 2021-03-04 LAB — TYPE AND SCREEN
ABO/RH(D): B POS
Antibody Screen: NEGATIVE

## 2021-03-04 LAB — PREGNANCY, URINE: Preg Test, Ur: NEGATIVE

## 2021-03-04 SURGERY — CREATION, GASTRIC BYPASS, ROUX-EN-Y, ROBOT-ASSISTED
Anesthesia: General | Site: Esophagus

## 2021-03-04 MED ORDER — ACETAMINOPHEN 160 MG/5ML PO SOLN
1000.0000 mg | Freq: Three times a day (TID) | ORAL | Status: DC
  Filled 2021-03-04: qty 40.6

## 2021-03-04 MED ORDER — 0.9 % SODIUM CHLORIDE (POUR BTL) OPTIME
TOPICAL | Status: DC | PRN
  Administered 2021-03-04: 1000 mL

## 2021-03-04 MED ORDER — BUPIVACAINE-EPINEPHRINE 0.25% -1:200000 IJ SOLN
INTRAMUSCULAR | Status: DC | PRN
  Administered 2021-03-04: 30 mL

## 2021-03-04 MED ORDER — CHLORHEXIDINE GLUCONATE CLOTH 2 % EX PADS: 6.0000 | MEDICATED_PAD | Freq: Once | CUTANEOUS | Status: DC

## 2021-03-04 MED ORDER — SODIUM CHLORIDE 0.9 % IV SOLN
2.0000 g | INTRAVENOUS | Status: AC
  Administered 2021-03-04: 2 g via INTRAVENOUS
  Filled 2021-03-04: qty 2

## 2021-03-04 MED ORDER — LIDOCAINE 2% (20 MG/ML) 5 ML SYRINGE
INTRAMUSCULAR | Status: AC
  Filled 2021-03-04: qty 5

## 2021-03-04 MED ORDER — ENSURE MAX PROTEIN PO LIQD
2.0000 [oz_av] | ORAL | Status: DC
  Administered 2021-03-05 – 2021-03-09 (×12): 2 [oz_av] via ORAL

## 2021-03-04 MED ORDER — PROMETHAZINE HCL 25 MG/ML IJ SOLN: 6.2500 mg | INTRAMUSCULAR | Status: DC | PRN

## 2021-03-04 MED ORDER — FENTANYL CITRATE (PF) 100 MCG/2ML IJ SOLN
INTRAMUSCULAR | Status: AC
  Filled 2021-03-04: qty 2

## 2021-03-04 MED ORDER — MIDAZOLAM HCL 2 MG/2ML IJ SOLN
INTRAMUSCULAR | Status: AC
  Filled 2021-03-04: qty 2

## 2021-03-04 MED ORDER — LISDEXAMFETAMINE DIMESYLATE 20 MG PO CAPS
20.0000 mg | ORAL_CAPSULE | Freq: Every day | ORAL | Status: DC
  Filled 2021-03-04 (×4): qty 1

## 2021-03-04 MED ORDER — PROPOFOL 10 MG/ML IV BOLUS
INTRAVENOUS | Status: AC
  Filled 2021-03-04: qty 20

## 2021-03-04 MED ORDER — HYDROMORPHONE HCL 1 MG/ML IJ SOLN
INTRAMUSCULAR | Status: AC
  Administered 2021-03-04: 0.25 mg via INTRAVENOUS
  Filled 2021-03-04: qty 1

## 2021-03-04 MED ORDER — DEXAMETHASONE SODIUM PHOSPHATE 10 MG/ML IJ SOLN
INTRAMUSCULAR | Status: AC
  Filled 2021-03-04: qty 1

## 2021-03-04 MED ORDER — SUGAMMADEX SODIUM 500 MG/5ML IV SOLN
INTRAVENOUS | Status: AC
  Filled 2021-03-04: qty 5

## 2021-03-04 MED ORDER — PANTOPRAZOLE SODIUM 40 MG IV SOLR
40.0000 mg | Freq: Every day | INTRAVENOUS | Status: DC
  Administered 2021-03-04 – 2021-03-07 (×4): 40 mg via INTRAVENOUS
  Filled 2021-03-04 (×4): qty 40

## 2021-03-04 MED ORDER — ENOXAPARIN SODIUM 40 MG/0.4ML IJ SOSY
40.0000 mg | PREFILLED_SYRINGE | INTRAMUSCULAR | Status: AC
  Administered 2021-03-04: 40 mg via SUBCUTANEOUS
  Filled 2021-03-04: qty 0.4

## 2021-03-04 MED ORDER — LIDOCAINE 2% (20 MG/ML) 5 ML SYRINGE
INTRAMUSCULAR | Status: DC | PRN
  Administered 2021-03-04: 100 mg via INTRAVENOUS

## 2021-03-04 MED ORDER — OXYCODONE HCL 5 MG/5ML PO SOLN
5.0000 mg | Freq: Four times a day (QID) | ORAL | Status: DC | PRN
  Administered 2021-03-05: 5 mg via ORAL
  Filled 2021-03-04: qty 5

## 2021-03-04 MED ORDER — PHENYLEPHRINE HCL (PRESSORS) 10 MG/ML IV SOLN
INTRAVENOUS | Status: AC
  Filled 2021-03-04: qty 1

## 2021-03-04 MED ORDER — APREPITANT 40 MG PO CAPS
40.0000 mg | ORAL_CAPSULE | ORAL | Status: AC
  Administered 2021-03-04: 40 mg via ORAL
  Filled 2021-03-04: qty 1

## 2021-03-04 MED ORDER — ONDANSETRON HCL 4 MG/2ML IJ SOLN
4.0000 mg | INTRAMUSCULAR | Status: DC | PRN
  Administered 2021-03-05 – 2021-03-08 (×5): 4 mg via INTRAVENOUS
  Filled 2021-03-04 (×5): qty 2

## 2021-03-04 MED ORDER — MIDAZOLAM HCL 5 MG/5ML IJ SOLN
INTRAMUSCULAR | Status: DC | PRN
  Administered 2021-03-04: 2 mg via INTRAVENOUS

## 2021-03-04 MED ORDER — SIMETHICONE 80 MG PO CHEW
80.0000 mg | CHEWABLE_TABLET | Freq: Four times a day (QID) | ORAL | Status: DC | PRN
  Administered 2021-03-04: 80 mg via ORAL
  Filled 2021-03-04: qty 1

## 2021-03-04 MED ORDER — GABAPENTIN 300 MG PO CAPS
300.0000 mg | ORAL_CAPSULE | ORAL | Status: AC
  Administered 2021-03-04: 300 mg via ORAL
  Filled 2021-03-04: qty 1

## 2021-03-04 MED ORDER — SCOPOLAMINE 1 MG/3DAYS TD PT72: 1.0000 | MEDICATED_PATCH | TRANSDERMAL | Status: DC

## 2021-03-04 MED ORDER — HYDROMORPHONE HCL 1 MG/ML IJ SOLN
0.2500 mg | INTRAMUSCULAR | Status: DC | PRN
  Administered 2021-03-04: 0.5 mg via INTRAVENOUS
  Administered 2021-03-04: 0.25 mg via INTRAVENOUS

## 2021-03-04 MED ORDER — DEXAMETHASONE SODIUM PHOSPHATE 10 MG/ML IJ SOLN
INTRAMUSCULAR | Status: DC | PRN
  Administered 2021-03-04: 10 mg via INTRAVENOUS

## 2021-03-04 MED ORDER — LACTATED RINGERS IV SOLN
INTRAVENOUS | Status: DC
Start: 2021-03-04 — End: 2021-03-06
  Administered 2021-03-04: 75 mL/h via INTRAVENOUS

## 2021-03-04 MED ORDER — PROPOFOL 10 MG/ML IV BOLUS
INTRAVENOUS | Status: DC | PRN
  Administered 2021-03-04: 200 mg via INTRAVENOUS

## 2021-03-04 MED ORDER — LACTATED RINGERS IV SOLN: INTRAVENOUS | Status: DC

## 2021-03-04 MED ORDER — ONDANSETRON HCL 4 MG/2ML IJ SOLN
INTRAMUSCULAR | Status: AC
  Filled 2021-03-04: qty 2

## 2021-03-04 MED ORDER — ROCURONIUM BROMIDE 10 MG/ML (PF) SYRINGE
PREFILLED_SYRINGE | INTRAVENOUS | Status: DC | PRN
  Administered 2021-03-04: 80 mg via INTRAVENOUS
  Administered 2021-03-04: 30 mg via INTRAVENOUS
  Administered 2021-03-04: 20 mg via INTRAVENOUS

## 2021-03-04 MED ORDER — ROCURONIUM BROMIDE 10 MG/ML (PF) SYRINGE
PREFILLED_SYRINGE | INTRAVENOUS | Status: AC
  Filled 2021-03-04: qty 10

## 2021-03-04 MED ORDER — ACETAMINOPHEN 500 MG PO TABS
1000.0000 mg | ORAL_TABLET | ORAL | Status: AC
Start: 2021-03-04 — End: 2021-03-04
  Administered 2021-03-04: 1000 mg via ORAL
  Filled 2021-03-04: qty 2

## 2021-03-04 MED ORDER — ACETAMINOPHEN 500 MG PO TABS
1000.0000 mg | ORAL_TABLET | Freq: Three times a day (TID) | ORAL | Status: DC
  Administered 2021-03-05 – 2021-03-09 (×8): 1000 mg via ORAL
  Filled 2021-03-04 (×11): qty 2

## 2021-03-04 MED ORDER — ONDANSETRON HCL 4 MG/2ML IJ SOLN
INTRAMUSCULAR | Status: DC | PRN
  Administered 2021-03-04: 4 mg via INTRAVENOUS

## 2021-03-04 MED ORDER — BUPIVACAINE-EPINEPHRINE (PF) 0.25% -1:200000 IJ SOLN
INTRAMUSCULAR | Status: AC
  Filled 2021-03-04: qty 30

## 2021-03-04 MED ORDER — LACTATED RINGERS IR SOLN
Status: DC | PRN
  Administered 2021-03-04: 1000 mL

## 2021-03-04 MED ORDER — FENTANYL CITRATE (PF) 100 MCG/2ML IJ SOLN
INTRAMUSCULAR | Status: DC | PRN
  Administered 2021-03-04: 50 ug via INTRAVENOUS
  Administered 2021-03-04: 25 ug via INTRAVENOUS
  Administered 2021-03-04: 100 ug via INTRAVENOUS
  Administered 2021-03-04: 25 ug via INTRAVENOUS

## 2021-03-04 MED ORDER — MEPERIDINE HCL 50 MG/ML IJ SOLN: 6.2500 mg | INTRAMUSCULAR | Status: DC | PRN

## 2021-03-04 MED ORDER — BUPIVACAINE LIPOSOME 1.3 % IJ SUSP
INTRAMUSCULAR | Status: DC | PRN
  Administered 2021-03-04: 20 mL

## 2021-03-04 MED ORDER — SCOPOLAMINE 1 MG/3DAYS TD PT72
MEDICATED_PATCH | TRANSDERMAL | Status: AC
  Administered 2021-03-04: 1.5 mg via TRANSDERMAL
  Filled 2021-03-04: qty 1

## 2021-03-04 MED ORDER — SUGAMMADEX SODIUM 200 MG/2ML IV SOLN
INTRAVENOUS | Status: DC | PRN
Start: 2021-03-04 — End: 2021-03-04
  Administered 2021-03-04: 300 mg via INTRAVENOUS

## 2021-03-04 MED ORDER — MORPHINE SULFATE (PF) 2 MG/ML IV SOLN
1.0000 mg | INTRAVENOUS | Status: DC | PRN
  Administered 2021-03-04: 1 mg via INTRAVENOUS
  Administered 2021-03-06 – 2021-03-08 (×2): 2 mg via INTRAVENOUS
  Filled 2021-03-04 (×4): qty 1

## 2021-03-04 MED ORDER — PROPOFOL 500 MG/50ML IV EMUL
INTRAVENOUS | Status: DC | PRN
  Administered 2021-03-04: 25 ug/kg/min via INTRAVENOUS

## 2021-03-04 MED ORDER — ENOXAPARIN SODIUM 30 MG/0.3ML IJ SOSY
30.0000 mg | PREFILLED_SYRINGE | Freq: Two times a day (BID) | INTRAMUSCULAR | Status: DC
  Administered 2021-03-04 – 2021-03-07 (×6): 30 mg via SUBCUTANEOUS
  Filled 2021-03-04 (×6): qty 0.3

## 2021-03-04 SURGICAL SUPPLY — 78 items
APPLIER CLIP 5 13 M/L LIGAMAX5 (MISCELLANEOUS)
APPLIER CLIP ROT 10 11.4 M/L (STAPLE)
BAG COUNTER SPONGE SURGICOUNT (BAG) IMPLANT
BLADE SURG SZ11 CARB STEEL (BLADE) ×3 IMPLANT
CANNULA REDUC XI 12-8 STAPL (CANNULA) ×1
CANNULA REDUCER 12-8 DVNC XI (CANNULA) ×2 IMPLANT
CHLORAPREP W/TINT 26 (MISCELLANEOUS) ×3 IMPLANT
CLIP APPLIE 5 13 M/L LIGAMAX5 (MISCELLANEOUS) IMPLANT
CLIP APPLIE ROT 10 11.4 M/L (STAPLE) IMPLANT
COVER SURGICAL LIGHT HANDLE (MISCELLANEOUS) ×6 IMPLANT
COVER TIP SHEARS 8 DVNC (MISCELLANEOUS) ×2 IMPLANT
COVER TIP SHEARS 8MM DA VINCI (MISCELLANEOUS) ×1
DECANTER SPIKE VIAL GLASS SM (MISCELLANEOUS) ×3 IMPLANT
DERMABOND ADVANCED (GAUZE/BANDAGES/DRESSINGS) ×1
DERMABOND ADVANCED .7 DNX12 (GAUZE/BANDAGES/DRESSINGS) ×2 IMPLANT
DRAPE ARM DVNC X/XI (DISPOSABLE) ×8 IMPLANT
DRAPE COLUMN DVNC XI (DISPOSABLE) ×2 IMPLANT
DRAPE DA VINCI XI ARM (DISPOSABLE) ×4
DRAPE DA VINCI XI COLUMN (DISPOSABLE) ×1
ELECT REM PT RETURN 15FT ADLT (MISCELLANEOUS) ×3 IMPLANT
ENDOLOOP SUT PDS II  0 18 (SUTURE)
ENDOLOOP SUT PDS II 0 18 (SUTURE) IMPLANT
GAUZE 4X4 16PLY ~~LOC~~+RFID DBL (SPONGE) ×3 IMPLANT
GLOVE SURG ENC MOIS LTX SZ7.5 (GLOVE) ×9 IMPLANT
GLOVE SURG UNDER LTX SZ8 (GLOVE) ×9 IMPLANT
GOWN STRL REUS W/TWL XL LVL3 (GOWN DISPOSABLE) ×9 IMPLANT
GRASPER SUT TROCAR 14GX15 (MISCELLANEOUS) ×3 IMPLANT
IRRIG SUCT STRYKERFLOW 2 WTIP (MISCELLANEOUS) ×3
IRRIGATION SUCT STRKRFLW 2 WTP (MISCELLANEOUS) ×2 IMPLANT
KIT BASIN OR (CUSTOM PROCEDURE TRAY) ×3 IMPLANT
KIT GASTRIC LAVAGE 34FR ADT (SET/KITS/TRAYS/PACK) ×3 IMPLANT
LUBRICANT JELLY K Y 4OZ (MISCELLANEOUS) IMPLANT
MARKER SKIN DUAL TIP RULER LAB (MISCELLANEOUS) ×3 IMPLANT
MAT PREVALON FULL STRYKER (MISCELLANEOUS) ×3 IMPLANT
NEEDLE SPNL 18GX3.5 QUINCKE PK (NEEDLE) ×3 IMPLANT
NEEDLE SPNL 22GX3.5 QUINCKE BK (NEEDLE) IMPLANT
OBTURATOR OPTICAL STANDARD 8MM (TROCAR) ×1
OBTURATOR OPTICAL STND 8 DVNC (TROCAR) ×2
OBTURATOR OPTICALSTD 8 DVNC (TROCAR) ×2 IMPLANT
PACK CARDIOVASCULAR III (CUSTOM PROCEDURE TRAY) ×3 IMPLANT
RELOAD STAPLER 2.5X60 WHT DVNC (STAPLE) ×12 IMPLANT
RELOAD STAPLER 3.5X60 BLU DVNC (STAPLE) ×4 IMPLANT
SCISSORS LAP 5X45 EPIX DISP (ENDOMECHANICALS) IMPLANT
SEAL CANN UNIV 5-8 DVNC XI (MISCELLANEOUS) ×6 IMPLANT
SEAL XI 5MM-8MM UNIVERSAL (MISCELLANEOUS) ×3
SEALER SYNCHRO 8 IS4000 DV (MISCELLANEOUS) ×1
SEALER SYNCHRO 8 IS4000 DVNC (MISCELLANEOUS) ×2 IMPLANT
SEALER VESSEL DA VINCI XI (MISCELLANEOUS)
SEALER VESSEL EXT DVNC XI (MISCELLANEOUS) IMPLANT
SOL ANTI FOG 6CC (MISCELLANEOUS) ×2 IMPLANT
SOLUTION ANTI FOG 6CC (MISCELLANEOUS) ×1
SOLUTION ELECTROLUBE (MISCELLANEOUS) ×3 IMPLANT
STAPLER 60 DA VINCI SURE FORM (STAPLE) ×1
STAPLER 60 SUREFORM DVNC (STAPLE) ×2 IMPLANT
STAPLER CANNULA SEAL DVNC XI (STAPLE) ×2 IMPLANT
STAPLER CANNULA SEAL XI (STAPLE) ×1
STAPLER RELOAD 2.5X60 WHITE (STAPLE) ×6
STAPLER RELOAD 2.5X60 WHT DVNC (STAPLE) ×12
STAPLER RELOAD 3.5X60 BLU DVNC (STAPLE) ×4
STAPLER RELOAD 3.5X60 BLUE (STAPLE) ×2
SUT ETHIBOND 0 (SUTURE) ×9 IMPLANT
SUT MNCRL AB 4-0 PS2 18 (SUTURE) ×3 IMPLANT
SUT V-LOC BARB 180 2/0GR6 GS22 (SUTURE) ×6
SUT VIC AB 2-0 SH 27 (SUTURE) ×1
SUT VIC AB 2-0 SH 27XBRD (SUTURE) ×2 IMPLANT
SUT VIC AB 3-0 SH 27 (SUTURE) ×1
SUT VIC AB 3-0 SH 27XBRD (SUTURE) ×2 IMPLANT
SUT VICRYL 0 TIES 12 18 (SUTURE) ×3 IMPLANT
SUT VLOC BARB 180 ABS3/0GR12 (SUTURE) ×3
SUTURE V-LC BRB 180 2/0GR6GS22 (SUTURE) ×4 IMPLANT
SUTURE VLOC BRB 180 ABS3/0GR12 (SUTURE) ×2 IMPLANT
SYR 20ML LL LF (SYRINGE) ×3 IMPLANT
TOWEL OR 17X26 10 PK STRL BLUE (TOWEL DISPOSABLE) IMPLANT
TRAY FOLEY MTR SLVR 16FR STAT (SET/KITS/TRAYS/PACK) IMPLANT
TROCAR ADV FIXATION 12X100MM (TROCAR) ×3 IMPLANT
TROCAR Z-THREAD FIOS 5X100MM (TROCAR) ×3 IMPLANT
TUBE CALIBRATION LAPBAND (TUBING) IMPLANT
TUBING INSUFFLATION 10FT LAP (TUBING) ×3 IMPLANT

## 2021-03-04 NOTE — Anesthesia Preprocedure Evaluation (Addendum)
Anesthesia Evaluation  Patient identified by MRN, date of birth, ID band Patient awake    Reviewed: Allergy & Precautions, NPO status , Patient's Chart, lab work & pertinent test results  History of Anesthesia Complications Negative for: history of anesthetic complications  Airway Mallampati: II  TM Distance: >3 FB Neck ROM: Full    Dental no notable dental hx. (+) Dental Advisory Given, Teeth Intact,    Pulmonary neg pulmonary ROS,    Pulmonary exam normal breath sounds clear to auscultation       Cardiovascular hypertension, + Orthopnea  Normal cardiovascular exam Rhythm:Regular Rate:Normal     Neuro/Psych PSYCHIATRIC DISORDERS Anxiety Depression ADHD   GI/Hepatic Neg liver ROS, GERD  Controlled,  Endo/Other  Morbid obesity (BMI 54)  Renal/GU negative Renal ROS     Musculoskeletal negative musculoskeletal ROS (+)   Abdominal (+) + obese,   Peds  Hematology negative hematology ROS (+)   Anesthesia Other Findings Day of surgery medications reviewed with patient.  Reproductive/Obstetrics negative OB ROS                           Anesthesia Physical  Anesthesia Plan  ASA: 3  Anesthesia Plan: General   Post-op Pain Management:    Induction: Intravenous  PONV Risk Score and Plan: 4 or greater and Treatment may vary due to age or medical condition, Propofol infusion, Ondansetron, Dexamethasone, Midazolam, Diphenhydramine and Aprepitant  Airway Management Planned: Oral ETT  Additional Equipment: None  Intra-op Plan:   Post-operative Plan: Extubation in OR  Informed Consent: I have reviewed the patients History and Physical, chart, labs and discussed the procedure including the risks, benefits and alternatives for the proposed anesthesia with the patient or authorized representative who has indicated his/her understanding and acceptance.     Dental advisory given  Plan  Discussed with: CRNA  Anesthesia Plan Comments:       Anesthesia Quick Evaluation

## 2021-03-04 NOTE — Transfer of Care (Signed)
Immediate Anesthesia Transfer of Care Note  Patient: Vanessa Wilcox  Procedure(s) Performed: XI ROBOT ASSISTED ROUX-EN-Y (Abdomen) UPPER GI ENDOSCOPY (Esophagus)  Patient Location: PACU  Anesthesia Type:General  Level of Consciousness: drowsy  Airway & Oxygen Therapy: Patient Spontanous Breathing and Patient connected to face mask oxygen  Post-op Assessment: Report given to RN and Post -op Vital signs reviewed and stable  Post vital signs: Reviewed and stable  Last Vitals:  Vitals Value Taken Time  BP 144/84 03/04/21 1421  Temp 36.8 C 03/04/21 1421  Pulse 94 03/04/21 1425  Resp 19 03/04/21 1425  SpO2 100 % 03/04/21 1425  Vitals shown include unvalidated device data.  Last Pain:  Vitals:   03/04/21 0905  TempSrc:   PainSc: 0-No pain      Patients Stated Pain Goal: 4 ([id number])  Complications: No notable events documented.

## 2021-03-04 NOTE — Op Note (Signed)
Patient: Vanessa Wilcox ([birth year], [id number])  Date of Surgery: 03/04/2021   Preoperative Diagnosis: morbid obesity, BMI 52  Postoperative Diagnosis: morbid obesity, BMI 52  Surgical Procedure: XI ROBOT ASSISTED ROUX-EN-Y: [id number] (CPT) UPPER GI ENDOSCOPY: [id number]   Operative Team Members:  Surgeon(s) and Role:    * Rashard Ryle, Nickola Major, MD - Primary    * Clovis Riley, MD - Assisting   Anesthesiologist: Nolon Nations, MD CRNA: Milford Cage, CRNA; Cleda Daub, CRNA; Key, Kristopher, CRNA   Anesthesia: General   Fluids:  Total I/O In: 100 [IV 0000000 Out: -   Complications: None  Drains:  none   Specimen: * No specimens in log *   Disposition:  PACU - hemodynamically stable.  Plan of Care: Admit to inpatient     Indications for Procedure: Vanessa Wilcox is an 40 y.o. female with morbid obesity BMI 52, and prediabetes here for elective robotic Roux-en-Y gastric bypass with upper endoscopy.  On multiple occasions prior to surgery we have discussed the surgeries themselves as well as the risks, benefits, and alternatives offered for metabolic surgery.  She decided to pursue a Roux-en-Y gastric bypass.  After full discussion all questions answered the patient granted consent to proceed.  We will proceed to the operating room as scheduled.  Findings: Normal anatomy  Infection status: Patient: Private Patient Elective Case Case: Elective Infection Present At Time Of Surgery (PATOS): Some spillage of foregut  and jejunal contents while creating anastomoses   Description of Procedure:   On the date stated above, the patient was taken to the operating room suite and placed in supine positioning.  General endotracheal anesthesia was induced.  A timeout was completed verifying the correct patient, procedure, positioning and equipment needed for the case.  The patient's abdomen was prepped and draped in the usual sterile fashion.  A 5 mm trocar was inserted in the  right upper quadrant using the optical technique.  There was no trauma to the underlying viscera with initial trocar placement.  Four robotic trocars were placed across the abdomen at this level.  The robotic stapling trocar was placed in the right most position, replacing the 34m entry trocar.  A 12 mm balloon assistant trocar was placed in the left lower quadrant tunneling through the rectus muscle.  The [facility] was placed through the subxiphoid region and under the left lobe of the liver and was connected to the rail of the bed.  A TAP block was placed using marcaine and Exparel under direct vision of the laparoscope.  The [place] and we transitioned to robotic surgery.  Using the tip up grasper, fenestrated bipolar, 30 degree camera and Synchroseal from the patient's right to left, we began by dissecting the angle of His off the left crus of the diaphragm.  The adhesions between the stomach, spleen and diaphragm were divided using the Synchroseal to define the angle of His.  I then started 4-6 cm down on the lesser curve of the stomach and created a defect in the gastrohepatic ligament tracking behind the lesser curve of the stomach to enter the lesser sac.  I then used multiple blue and one white load of the robotic 60 mm Sureform linear stapler to form the gastric pouch.  I then directed my attention to the lower abdomen.  The omentum was divided with the Synchroseal and I identified the ligament of treitz.  The jejunum was run to a point 50 cm from  the ligament of Treitz.  This loop of bowel was then brought into the left upper quadrant, over the transverse colon, between the split omentum.  A 2-0 v-loc suture was used to create the posterior outer row of the gastrojejunal anastomosis.  A window was made in the jejunal mesentery and the jejunum was divided just proximal to the gastrojejunal anastomosis using a white load of the robotic 60 mm Sureform linear  stapler to divide the roux limb from the hepatobiliary limb.  I then continued to create the gastrojejunal anastomosis.  An approximately 2 cm gastrotomy was made in the pouch and a matching 2 cm enterotomy was created in the roux limb.  Then, 3-0 v-loc was used to create a posterior, inner, full thickness layer of the anastomosis.  A second 3-0 v-loc was used to create an anterior, inner, full thickness layer of the anastomosis.  While sewing the anterior inner layer, the Ewald tube was passed through the anastomosis to ensure patency.  The outer, anterior layer was then created using 2-0 v-loc suture.  At this point the gastrojejunal anastomosis was complete and the Ewald tube was removed.  The roux limb was then run 100 cm from the gastrojejunal anastomosis.  This loop of bowel was brought into the left upper quadrant for anastomosis to the hepatobiliary limb.  A vicryl stay suture was placed from the antimesenteric border of the hepatobiliary limb to the roux limb.  Enterotomies were created in both limbs using the monopolar scissors.  A robotic 60 mm white load on the Sureform linear stapler was introduced into both limbs and fired to create the jejunojejunostomy.  A second intra-luminal staple firing with a white load was fired to connect the distal roux limb to the common channel creating a "W" shaped anastomosis.  The common enterotomy was closed with a white load of the stapler.   The jejunojejunostomy mesenteric defect was closed using running 0-Ethibond suture.  The retro-roux defect was closed, closing the roux limb mesentery to the transverse mesocolon mesentery using a running 0-Ethibond suture.   I ran the roux limb from the gastrojejunal anastomosis to the jejunojejunostomy and found the anatomy as expected without any twisting of the mesentery.  The tip-up grasper was used to occlude the proximal roux limb.  I then transitioned to endoscopic portion of the procedure.  The adult upper endoscope  was inserted into the pouch.  The pouch appeared appropriately sized.  There was good intra-luminal hemostasis.  The endoscope was inserted through the anastomosis into the roux limb.  The anastomosis appeared well formed without any stricture.  The anastomosis was submerged in irrigation in the left upper quadrant and there was no leakage of air bubbles with endoscopic insufflation suggesting a negative leak test and an air tight anastomosis.    The foregut was decompressed with the endoscope and the endoscope was removed.  The small bowel was released by the tip-up grasper and then the robotic instruments were removed and the robot was undocked.  The stapler port in the right abdomen was closed at the fascial level using 0-vicryl on a PMI suture passer.  The 12 mm assistant port was not closed as it was tunneled through the abdominal wall.  The liver retractor was removed under direct vision.  The pneumoperitoneum was evacuated.  The skin was closed using 4-0 Monocryl and Dermabond.  All sponge and needle counts were correct at the end of the case.    Louanna Raw, MD General, Bariatric, &  Minimally Invasive Surgery [facility] Surgery, [place]

## 2021-03-04 NOTE — Progress Notes (Signed)
PHARMACY CONSULT FOR:  Risk Assessment for Post-Discharge VTE Following Bariatric Surgery  Post-Discharge VTE Risk Assessment: This patient's probability of 30-day post-discharge VTE is increased due to the factors marked:   Female    Age >/=60 years  x  BMI >/=50 kg/m2    CHF    Dyspnea at Rest    Paraplegia  x  Non-gastric-band surgery    Operation Time >/=3 hr    Return to OR     Length of Stay >/= 3 d   Hx of VTE   Hypercoagulable condition   Significant venous stasis       Predicted probability of 30-day post-discharge VTE: 0.27%  Other patient-specific factors to consider: none  Recommendation for Discharge: No pharmacologic prophylaxis post-discharge  Vanessa Wilcox is a 40 y.o. 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  No results for input(s): WBC, HGB, HCT, PLT, APTT, CREATININE, LABCREA, CREATININE, CREAT24HRUR, MG, PHOS, ALBUMIN, PROT, ALBUMIN, AST, ALT, ALKPHOS, BILITOT, BILIDIR, IBILI in the last 72 hours. Estimated Creatinine Clearance: 122.1 mL/min (by C-G formula based on SCr of 0.78 mg/dL).    Past Medical History:  Diagnosis Date   ADHD    Anxiety    Back pain    Depression    Family history of lung cancer    Family history of multiple myeloma    Pre-diabetes    STD (sexually transmitted disease)    Tx'd for chlamydia over 20 years ago     Medications Prior to Admission  Medication Sig Dispense Refill Last Dose   acetaminophen (TYLENOL) 500 MG tablet Take 1,000 mg by mouth every 6 (six) hours as needed for moderate pain or headache.   Past Week   ASHWAGANDHA PO Take 2 tablets by mouth every evening.   Past Week   lisdexamfetamine (VYVANSE) 20 MG capsule Take 1 capsule (20 mg total) by mouth daily. 30 capsule 0    Vitamin D, Ergocalciferol, (DRISDOL) 1.25 MG  (50000 UNIT) CAPS capsule Take 1 capsule (50,000 Units total) by mouth every 7 (seven) days. (Patient taking differently: Take 50,000 Units by mouth every Sunday.) 4 capsule 0    norethindrone (MICRONOR) 0.35 MG tablet Take 1 tablet by mouth daily. (Patient not taking: Reported on 02/20/2021)   Not Taking   pantoprazole (PROTONIX) 40 MG tablet Take 1 tablet (40 mg total) by mouth daily. (Patient not taking: No sig reported) 90 tablet 3 Not Taking   Semaglutide, 1 MG/DOSE, (OZEMPIC, 1 MG/DOSE,) 4 MG/3ML SOPN Inject 1 mg into the skin once a week. (Patient not taking: Reported on 02/20/2021) 3 mL 0 Not Taking     Eudelia Bunch, Pharm.D [phone] 03/04/2021 2:56 PM

## 2021-03-04 NOTE — Progress Notes (Signed)
Discussed QI "Goals for Discharge" document with patient and family including ambulation in halls, Incentive Spirometry use every hour, and oral care.  Also discussed pain and nausea control.  Enabled or verified head of bed 30 degree alarm activated.  BSTOP education provided including BSTOP information guide, "Guide for Pain Management after your Bariatric Procedure".  Diet progression education provided including "Bariatric Surgery Post-Op Food Plan Phase 1: Liquids".  Questions answered.  Will continue to partner with bedside RN and follow up with patient per protocol.

## 2021-03-04 NOTE — H&P (Signed)
Admitting Physician: [place]  Service: Bariatric surgery  CC: Morbid Obesity BMI 52  Subjective   HPI: Vanessa Wilcox is an 40 y.o. female who is here for elective robotic gastric bypass.  Past Medical History:  Diagnosis Date   ADHD    Anxiety    Back pain    Depression    Family history of lung cancer    Family history of multiple myeloma    Pre-diabetes    STD (sexually transmitted disease)    Tx'd for chlamydia over 20 years ago    Past Surgical History:  Procedure Laterality Date   ASPIRATION BIOPSY     fluid around heart- negative results in 2000   BIOPSY  01/28/2021   Procedure: BIOPSY;  Surgeon: Felicie Morn, MD;  Location: [facility];  Service: General;;   ESOPHAGOGASTRODUODENOSCOPY N/A 01/28/2021   Procedure: ESOPHAGOGASTRODUODENOSCOPY (EGD);  Surgeon: Felicie Morn, MD;  Location: [facility];  Service: General;  Laterality: N/A;   fluid on heart  2000   chest tube placed   IUD removal under sedation      Family History  Problem Relation Age of Onset   Diabetes Mother    High blood pressure Mother    Depression Mother    Sleep apnea Mother    Obesity Mother    Hypertension Mother    Hyperlipidemia Mother    Thyroid disease Mother    Lung cancer Mother 95   High Cholesterol Father    Cancer Father        unknown type - leg amputated   Alcoholism Father    Ovarian cancer Half-Sister 58       Dec ovarian Cancer   Breast cancer Half-Sister 36   Diabetes Maternal Grandmother    Stroke Maternal Grandfather    Multiple myeloma Cousin 81       in remission (maternal first cousin)    Social:  reports that she has never smoked. She has never used smokeless tobacco. She reports current alcohol use. She reports that she does not use drugs.  Allergies: No Known Allergies  Medications: Current Outpatient Medications  Medication Instructions   acetaminophen (TYLENOL) 1,000 mg, Oral, Every 6 hours PRN   ASHWAGANDHA PO 2  tablets, Oral, Every evening   lisdexamfetamine (VYVANSE) 20 mg, Oral, Daily   norethindrone (MICRONOR) 0.35 MG tablet 1 tablet, Daily   Ozempic (1 MG/DOSE) 1 mg, Subcutaneous, Weekly   pantoprazole (PROTONIX) 40 mg, Oral, Daily   Vitamin D (Ergocalciferol) (DRISDOL) 50,000 Units, Oral, Every 7 days    ROS - all of the below systems have been reviewed with the patient and positives are indicated with bold text General: chills, fever or night sweats Eyes: blurry vision or double vision ENT: epistaxis or sore throat Allergy/Immunology: itchy/watery eyes or nasal congestion Hematologic/Lymphatic: bleeding problems, blood clots or swollen lymph nodes Endocrine: temperature intolerance or unexpected weight changes Breast: new or changing breast lumps or nipple discharge Resp: cough, shortness of breath, or wheezing CV: chest pain or dyspnea on exertion GI: as per HPI GU: dysuria, trouble voiding, or hematuria MSK: joint pain or joint stiffness Neuro: TIA or stroke symptoms Derm: pruritus and skin lesion changes Psych: anxiety and depression  Objective   PE Blood pressure 140/90, pulse 83, temperature 98.1 F (36.7 C), temperature source Oral, resp. rate 16, height 5' 2.5" (1.588 m), weight 132 kg, SpO2 100 %. Constitutional: NAD; conversant; no deformities Eyes: Moist conjunctiva; no lid lag; anicteric; PERRL Neck:  Trachea midline; no thyromegaly Lungs: Normal respiratory effort; no tactile fremitus CV: RRR; no palpable thrills; no pitting edema GI: Abd Soft, non-tender; no palpable hepatosplenomegaly MSK: Normal range of motion of extremities; no clubbing/cyanosis Psychiatric: Appropriate affect; alert and oriented x3 Lymphatic: No palpable cervical or axillary lymphadenopathy  Results for orders placed or performed during the hospital encounter of 03/04/21 (from the past 24 hour(s))  Pregnancy, urine STAT morning of surgery     Status: None   Collection Time: 03/04/21  8:36 AM   Result Value Ref Range   Preg Test, Ur NEGATIVE NEGATIVE  ABO/Rh     Status: None   Collection Time: 03/04/21  8:59 AM  Result Value Ref Range   ABO/RH(D)      B POS Performed at [facility], [place] [address] [place], [place] [address]     Imaging Orders  No imaging studies ordered today     Assessment and Plan   Vanessa Wilcox is an 40 y.o. female with morbid obesity BMI 52, and prediabetes here for elective robotic Roux-en-Y gastric bypass with upper endoscopy.  On multiple occasions prior to surgery we have discussed the surgeries themselves as well as the risks, benefits, and alternatives offered for metabolic surgery.  She decided to pursue a Roux-en-Y gastric bypass.  After full discussion all questions answered the patient granted consent to proceed.  We will proceed to the operating room as scheduled.   Felicie Morn, MD  [facility] Use [email or web address] to contact on call provider

## 2021-03-04 NOTE — Anesthesia Procedure Notes (Signed)
Procedure Name: Intubation Date/Time: 03/04/2021 10:57 AM Performed by: Milford Cage, CRNA Pre-anesthesia Checklist: Patient identified, Emergency Drugs available, Suction available and Patient being monitored Patient Re-evaluated:Patient Re-evaluated prior to induction Oxygen Delivery Method: Circle system utilized Preoxygenation: Pre-oxygenation with 100% oxygen Induction Type: IV induction Ventilation: Mask ventilation without difficulty and Oral airway inserted - appropriate to patient size Laryngoscope Size: Mac and 3 Grade View: Grade II Tube type: Oral Tube size: 7.5 mm Number of attempts: 1 Airway Equipment and Method: Stylet Placement Confirmation: ETT inserted through vocal cords under direct vision, positive ETCO2 and breath sounds checked- equal and bilateral Secured at: 21 cm Tube secured with: Tape Dental Injury: Teeth and Oropharynx as per pre-operative assessment

## 2021-03-05 ENCOUNTER — Ambulatory Visit (INDEPENDENT_AMBULATORY_CARE_PROVIDER_SITE_OTHER): Payer: BC Managed Care – PPO | Admitting: Family Medicine

## 2021-03-05 ENCOUNTER — Encounter (HOSPITAL_COMMUNITY): Payer: Self-pay | Admitting: Surgery

## 2021-03-05 LAB — CBC WITH DIFFERENTIAL/PLATELET
Abs Immature Granulocytes: 0.02 10*3/uL (ref 0.00–0.07)
Basophils Absolute: 0 10*3/uL (ref 0.0–0.1)
Basophils Relative: 0 %
Eosinophils Absolute: 0 10*3/uL (ref 0.0–0.5)
Eosinophils Relative: 0 %
HCT: 36.1 % (ref 36.0–46.0)
Hemoglobin: 11.7 g/dL — ABNORMAL LOW (ref 12.0–15.0)
Immature Granulocytes: 0 %
Lymphocytes Relative: 10 %
Lymphs Abs: 1.1 10*3/uL (ref 0.7–4.0)
MCH: 26.7 pg (ref 26.0–34.0)
MCHC: 32.4 g/dL (ref 30.0–36.0)
MCV: 82.2 fL (ref 80.0–100.0)
Monocytes Absolute: 0.8 10*3/uL (ref 0.1–1.0)
Monocytes Relative: 8 %
Neutro Abs: 8.6 10*3/uL — ABNORMAL HIGH (ref 1.7–7.7)
Neutrophils Relative %: 82 %
Platelets: 241 10*3/uL (ref 150–400)
RBC: 4.39 MIL/uL (ref 3.87–5.11)
RDW: 14.2 % (ref 11.5–15.5)
WBC: 10.6 10*3/uL — ABNORMAL HIGH (ref 4.0–10.5)
nRBC: 0 % (ref 0.0–0.2)

## 2021-03-05 MED ORDER — OXYCODONE HCL 5 MG/5ML PO SOLN
5.0000 mg | ORAL | Status: DC | PRN
  Administered 2021-03-05 (×2): 5 mg via ORAL
  Filled 2021-03-05 (×3): qty 5

## 2021-03-05 NOTE — Progress Notes (Signed)
Patient started protein.

## 2021-03-05 NOTE — Anesthesia Postprocedure Evaluation (Signed)
Anesthesia Post Note  Patient: Vanessa Wilcox  Procedure(s) Performed: XI ROBOT ASSISTED ROUX-EN-Y (Abdomen) UPPER GI ENDOSCOPY (Esophagus)     Patient location during evaluation: PACU Anesthesia Type: General Level of consciousness: sedated and patient cooperative Pain management: pain level controlled Vital Signs Assessment: post-procedure vital signs reviewed and stable Respiratory status: spontaneous breathing Cardiovascular status: stable Anesthetic complications: no   No notable events documented.  Last Vitals:  Vitals:   03/05/21 0552 03/05/21 0922  BP: (!) 132/94 139/84  Pulse: (!) 106 82  Resp: 16 18  Temp: 37 C 36.9 C  SpO2: 98% 99%    Last Pain:  Vitals:   03/05/21 1212  TempSrc:   PainSc: [place]

## 2021-03-05 NOTE — Progress Notes (Signed)
Patient alert and oriented, Post op day 1.  Provided support and encouragement.  Encouraged pulmonary toilet, ambulation and small sips of liquids.  All questions answered.  Will continue to monitor. 

## 2021-03-05 NOTE — Progress Notes (Signed)
Progress Note: General Surgery Service   Chief Complaint/Subjective: Gas pain and drowsy yesterday after anesthesia.  Better this morning.  Pain now mostly around incisions.  Ambulating.  Sipping - some pain with liquids.  Doing IS.  Objective: Vital signs in last 24 hours: Temp:  [97.3 F (36.3 C)-98.6 F (37 C)] 98.6 F (37 C) (08/31 0552) Pulse Rate:  [80-106] 106 (08/31 0552) Resp:  [12-20] 16 (08/31 0552) BP: (132-165)/(70-116) 132/94 (08/31 0552) SpO2:  [96 %-100 %] 98 % (08/31 0552) Last BM Date: 03/03/21  Intake/Output from previous day: 08/30 0701 - 08/31 0700 In: 1830 [P.O.:300; I.V.:1430; IV Piggyback:100] Out: 600 [Urine:500; Blood:100] Intake/Output this shift: Total I/O In: 36 [P.O.:60] Out: -   Constitutional: NAD; conversant; no deformities Eyes: Moist conjunctiva; no lid lag; anicteric; PERRL Neck: Trachea midline; no thyromegaly Lungs: Normal respiratory effort; no tactile fremitus CV: RRR; no palpable thrills; no pitting edema GI: Abd Incisions c/d/I w/ glue; no palpable hepatosplenomegaly MSK: Normal range of motion of extremities; no clubbing/cyanosis Psychiatric: Appropriate affect; alert and oriented x3 Lymphatic: No palpable cervical or axillary lymphadenopathy  Lab Results: CBC  Recent Labs    03/04/21 1522 03/05/21 0430  WBC 15.8* 10.6*  HGB 14.1 11.7*  HCT 44.6 36.1  PLT 275 241   BMET Recent Labs    03/04/21 1522  CREATININE 0.85   PT/INR No results for input(s): LABPROT, INR in the last 72 hours. ABG No results for input(s): PHART, HCO3 in the last 72 hours.  Invalid input(s): PCO2, PO2  Anti-infectives: Anti-infectives (From admission, onward)    Start     Dose/Rate Route Frequency Ordered Stop   03/04/21 0830  cefoTEtan (CEFOTAN) 2 g in sodium chloride 0.9 % 100 mL IVPB        2 g 200 mL/hr over 30 Minutes Intravenous On call to O.R. 03/04/21 0826 03/04/21 1100       Medications: Scheduled Meds:  acetaminophen   1,000 mg Oral Q8H   Or   acetaminophen (TYLENOL) oral liquid 160 mg/5 mL  1,000 mg Oral Q8H   enoxaparin (LOVENOX) injection  30 mg Subcutaneous Q12H   lisdexamfetamine  20 mg Oral Daily   pantoprazole (PROTONIX) IV  40 mg Intravenous QHS   Ensure Max Protein  2 oz Oral Q2H   Continuous Infusions:  lactated ringers 75 mL/hr (03/04/21 2343)   PRN Meds:.morphine injection, ondansetron (ZOFRAN) IV, oxyCODONE, simethicone  Assessment/Plan: s/p Procedure(s): XI ROBOT ASSISTED ROUX-EN-Y UPPER GI ENDOSCOPY 03/04/2021  Doing well POD 1 Continue bariatric protocol Likely discharge tomorrow   LOS: 1 day     Felicie Morn, MD  [facility] Use [email or web address] to contact on call provider

## 2021-03-05 NOTE — Discharge Instructions (Signed)

## 2021-03-05 NOTE — Progress Notes (Signed)
Patient alert and oriented, pain is controlled. Patient is tolerating fluids, advanced to protein shake today, patient is tolerating well. Reviewed Gastric Bypass discharge instructions with patient and patient is able to articulate understanding. Provided information on BELT program, Support Group and [facility] outpatient pharmacy. All questions answered, will continue to monitor.    

## 2021-03-05 NOTE — Progress Notes (Signed)
Nutrition Education Note ° °Received consult for diet education for patient s/p bariatric surgery. ° °Discussed 2 week post op diet with pt. Emphasized that liquids must be non carbonated, non caffeinated, and sugar free. Fluid goals discussed. Pt to follow up with outpatient bariatric RD for further diet progression after 2 weeks. Multivitamins and minerals also reviewed. Teach back method used, pt expressed understanding, expect good compliance. ° °If nutrition issues arise, please consult RD. ° °Vanessa Drummond, MS, RD, LDN °Inpatient Clinical Dietitian °Contact information available via Amion ° ° °

## 2021-03-06 LAB — CBC WITH DIFFERENTIAL/PLATELET
Abs Immature Granulocytes: 0.05 10*3/uL (ref 0.00–0.07)
Basophils Absolute: 0 10*3/uL (ref 0.0–0.1)
Basophils Relative: 0 %
Eosinophils Absolute: 0 10*3/uL (ref 0.0–0.5)
Eosinophils Relative: 0 %
HCT: 31.6 % — ABNORMAL LOW (ref 36.0–46.0)
Hemoglobin: 10.3 g/dL — ABNORMAL LOW (ref 12.0–15.0)
Immature Granulocytes: 0 %
Lymphocytes Relative: 17 %
Lymphs Abs: 2.1 10*3/uL (ref 0.7–4.0)
MCH: 27.2 pg (ref 26.0–34.0)
MCHC: 32.6 g/dL (ref 30.0–36.0)
MCV: 83.6 fL (ref 80.0–100.0)
Monocytes Absolute: 0.9 10*3/uL (ref 0.1–1.0)
Monocytes Relative: 7 %
Neutro Abs: 9.3 10*3/uL — ABNORMAL HIGH (ref 1.7–7.7)
Neutrophils Relative %: 76 %
Platelets: 232 10*3/uL (ref 150–400)
RBC: 3.78 MIL/uL — ABNORMAL LOW (ref 3.87–5.11)
RDW: 14.6 % (ref 11.5–15.5)
WBC: 12.4 10*3/uL — ABNORMAL HIGH (ref 4.0–10.5)
nRBC: 0 % (ref 0.0–0.2)

## 2021-03-06 MED ORDER — KCL IN DEXTROSE-NACL 20-5-0.45 MEQ/L-%-% IV SOLN
INTRAVENOUS | Status: DC
  Filled 2021-03-06: qty 1000

## 2021-03-06 MED ORDER — METOCLOPRAMIDE HCL 5 MG/ML IJ SOLN
10.0000 mg | Freq: Four times a day (QID) | INTRAMUSCULAR | Status: DC
  Administered 2021-03-06 – 2021-03-09 (×12): 10 mg via INTRAVENOUS
  Filled 2021-03-06 (×12): qty 2

## 2021-03-06 NOTE — Progress Notes (Signed)
Patient alert and oriented, Post op day 2.  Patient is in recliner, very emotional and nauseated.  Pt c/o chest discomfort, but is not able to accurately describe.  Pt states that she had 3 liquid stools and has only been able to consume 2oz of protein shake.  Pt vomits 250m of liquid while with patient.  Provided support and encouragement.  Encouraged pulmonary toilet and ambulation.  MD made aware.  All questions answered.  Will continue to monitor.

## 2021-03-06 NOTE — Progress Notes (Signed)
Patient assessed for PIV, Ultrasound guided PIV, midline and PICC.  Unable to locate any suitable veins for any attempt despite spending 45 minutes looking over patient's bilateral upper extremities.  Assessed patient's feet to find that there are no suitable sites in this location either.  Bedside RN Vee updated.  Will plan to have second IV team RN assess after 2300.

## 2021-03-06 NOTE — Progress Notes (Signed)
Progress Note: General Surgery Service   Chief Complaint/Subjective: Took some protein shake, tylenol and oxycodone last night and threw it all up.  Objective: Vital signs in last 24 hours: Temp:  [97.5 F (36.4 C)-99 F (37.2 C)] 98.8 F (37.1 C) (09/01 0631) Pulse Rate:  [81-99] 99 (09/01 0631) Resp:  [16-18] 16 (09/01 0631) BP: (132-157)/(82-94) 137/94 (09/01 0631) SpO2:  [97 %-100 %] 97 % (09/01 0631) Last BM Date: 03/04/21  Intake/Output from previous day: 08/31 0701 - 09/01 0700 In: 2671.7 [P.O.:120; I.V.:2551.7] Out: 1900 [Urine:1800; Emesis/NG output:100] Intake/Output this shift: No intake/output data recorded.  Constitutional: NAD; conversant; no deformities Eyes: Moist conjunctiva; no lid lag; anicteric; PERRL Neck: Trachea midline; no thyromegaly Lungs: Normal respiratory effort; no tactile fremitus CV: RRR; no palpable thrills; no pitting edema GI: Abd Incisions c/d/I w/ glue; no palpable hepatosplenomegaly MSK: Normal range of motion of extremities; no clubbing/cyanosis Psychiatric: Appropriate affect; alert and oriented x3 Lymphatic: No palpable cervical or axillary lymphadenopathy  Lab Results: CBC  Recent Labs    03/05/21 0430 03/06/21 0446  WBC 10.6* 12.4*  HGB 11.7* 10.3*  HCT 36.1 31.6*  PLT 241 232    BMET Recent Labs    03/04/21 1522  CREATININE 0.85    PT/INR No results for input(s): LABPROT, INR in the last 72 hours. ABG No results for input(s): PHART, HCO3 in the last 72 hours.  Invalid input(s): PCO2, PO2  Anti-infectives: Anti-infectives (From admission, onward)    Start     Dose/Rate Route Frequency Ordered Stop   03/04/21 0830  cefoTEtan (CEFOTAN) 2 g in sodium chloride 0.9 % 100 mL IVPB        2 g 200 mL/hr over 30 Minutes Intravenous On call to O.R. 03/04/21 0826 03/04/21 1100       Medications: Scheduled Meds:  acetaminophen  1,000 mg Oral Q8H   Or   acetaminophen (TYLENOL) oral liquid 160 mg/5 mL  1,000 mg  Oral Q8H   enoxaparin (LOVENOX) injection  30 mg Subcutaneous Q12H   lisdexamfetamine  20 mg Oral Daily   pantoprazole (PROTONIX) IV  40 mg Intravenous QHS   Ensure Max Protein  2 oz Oral Q2H   Continuous Infusions:  lactated ringers 75 mL/hr at 03/06/21 0026   PRN Meds:.morphine injection, ondansetron (ZOFRAN) IV, oxyCODONE, simethicone  Assessment/Plan: s/p Procedure(s): XI ROBOT ASSISTED ROUX-EN-Y UPPER GI ENDOSCOPY 03/04/2021  Pace liquid intake - no more than 4 oz per hour Space out medications Likely some edema at [place] anastomosis causing nausea/vomiting issues this morning Start sipping on liquids again this morning, possibly home later today   LOS: 2 days     Felicie Morn, MD  [facility] Use [email or web address] to contact on call provider

## 2021-03-07 ENCOUNTER — Inpatient Hospital Stay (HOSPITAL_COMMUNITY): Payer: BC Managed Care – PPO

## 2021-03-07 ENCOUNTER — Other Ambulatory Visit (HOSPITAL_COMMUNITY): Payer: Self-pay

## 2021-03-07 MED ORDER — ONDANSETRON 4 MG PO TBDP
4.0000 mg | ORAL_TABLET | Freq: Four times a day (QID) | ORAL | 0 refills | Status: DC | PRN
  Filled 2021-03-07: qty 18, 21d supply, fill #0

## 2021-03-07 MED ORDER — ENOXAPARIN (LOVENOX) PATIENT EDUCATION KIT
PACK | Freq: Once | Status: DC
  Filled 2021-03-07: qty 1

## 2021-03-07 MED ORDER — PANTOPRAZOLE SODIUM 40 MG PO TBEC
40.0000 mg | DELAYED_RELEASE_TABLET | Freq: Every day | ORAL | 0 refills | Status: DC
  Filled 2021-03-07: qty 90, 90d supply, fill #0

## 2021-03-07 MED ORDER — ACETAMINOPHEN 500 MG PO TABS: 1000.0000 mg | ORAL_TABLET | Freq: Three times a day (TID) | ORAL | 0 refills | Status: AC

## 2021-03-07 MED ORDER — GABAPENTIN 100 MG PO CAPS
200.0000 mg | ORAL_CAPSULE | Freq: Two times a day (BID) | ORAL | 0 refills | Status: DC
  Filled 2021-03-07: qty 20, 5d supply, fill #0

## 2021-03-07 MED ORDER — ENOXAPARIN SODIUM 60 MG/0.6ML IJ SOSY
60.0000 mg | PREFILLED_SYRINGE | Freq: Two times a day (BID) | INTRAMUSCULAR | 0 refills | Status: DC
  Filled 2021-03-07: qty 16.8, 14d supply, fill #0

## 2021-03-07 MED ORDER — OXYCODONE HCL 5 MG PO TABS
5.0000 mg | ORAL_TABLET | Freq: Four times a day (QID) | ORAL | 0 refills | Status: DC | PRN
  Filled 2021-03-07: qty 10, 3d supply, fill #0

## 2021-03-07 MED ORDER — ENOXAPARIN SODIUM 60 MG/0.6ML IJ SOSY
60.0000 mg | PREFILLED_SYRINGE | Freq: Two times a day (BID) | INTRAMUSCULAR | Status: DC
  Administered 2021-03-07 – 2021-03-09 (×4): 60 mg via SUBCUTANEOUS
  Filled 2021-03-07 (×4): qty 0.6

## 2021-03-07 MED ORDER — IOHEXOL 300 MG/ML  SOLN
50.0000 mL | Freq: Once | INTRAMUSCULAR | Status: AC | PRN
  Administered 2021-03-07: 25 mL via ORAL

## 2021-03-07 NOTE — Progress Notes (Signed)
PHARMACY CONSULT FOR:  Risk Assessment for Post-Discharge VTE Following Bariatric Surgery  Post-Discharge VTE Risk Assessment: This patient's probability of 30-day post-discharge VTE is increased due to the factors marked:   Female    Age >/=60 years  x  BMI >/=50 kg/m2    CHF    Dyspnea at Rest    Paraplegia  x  Non-gastric-band surgery    Operation Time >/=3 hr    Return to OR    X Length of Stay >/= 3 d   Hx of VTE   Hypercoagulable condition   Significant venous stasis   Predicted probability of 30-day post-discharge VTE: originally calculate was 0.27% on POD0 Today, 9/2 is Day 3 post-op, recalculating risk = 0.42%, > 0.4%  Other patient-specific factors to consider: none  Recommendation for Discharge: new recommendation - Lovenox 41m SQ bid x 2 weeks  Vanessa Garmanis a 40y.o. female who underwent laparoscopic Roux-en-Y gastric bypass 03/07/2021  No Known Allergies  Patient Measurements: Height: 5' 2.5" (158.8 cm) Weight: 132 kg (291 lb) IBW/kg (Calculated) : 51.25 Body mass index is 52.38 kg/m.  Recent Labs    03/04/21 1522 03/05/21 0430 03/06/21 0446  WBC 15.8* 10.6* 12.4*  HGB 14.1 11.7* 10.3*  HCT 44.6 36.1 31.6*  PLT 275 241 232  CREATININE 0.85  --   --    Estimated Creatinine Clearance: 115 mL/min (by C-G formula based on SCr of 0.85 mg/dL).    Past Medical History:  Diagnosis Date   ADHD    Anxiety    Back pain    Depression    Family history of lung cancer    Family history of multiple myeloma    Pre-diabetes    STD (sexually transmitted disease)    Tx'd for chlamydia over 20 years ago     Medications Prior to Admission  Medication Sig Dispense Refill Last Dose   acetaminophen (TYLENOL) 500 MG tablet Take 1,000 mg by mouth every 6 (six) hours as needed for moderate pain or headache.   Past Week   ASHWAGANDHA PO Take 2 tablets by mouth every evening.   Past Week   lisdexamfetamine (VYVANSE) 20 MG capsule Take 1 capsule (20 mg total) by  mouth daily. 30 capsule 0    Vitamin D, Ergocalciferol, (DRISDOL) 1.25 MG (50000 UNIT) CAPS capsule Take 1 capsule (50,000 Units total) by mouth every 7 (seven) days. (Patient taking differently: Take 50,000 Units by mouth every Sunday.) 4 capsule 0    norethindrone (MICRONOR) 0.35 MG tablet Take 1 tablet by mouth daily. (Patient not taking: Reported on 02/20/2021)   Not Taking   pantoprazole (PROTONIX) 40 MG tablet Take 1 tablet (40 mg total) by mouth daily. (Patient not taking: No sig reported) 90 tablet 3 Not Taking   Semaglutide, 1 MG/DOSE, (OZEMPIC, 1 MG/DOSE,) 4 MG/3ML SOPN Inject 1 mg into the skin once a week. (Patient not taking: Reported on 02/20/2021) 3 mL 0 Not Taking    GMinda DittoPharmD [facility] [id number]/08/2020, 11:16 AM

## 2021-03-07 NOTE — Progress Notes (Signed)
Patient alert and oriented, Post op day 3.  Pt is sitting up in recliner, states that she is feeling much better.  Pt just came back from having upper GI completed, waiting to see surgeon.  Provided support and encouragement.  Encouraged pulmonary toilet, ambulation and small sips of liquids.  All questions answered.  Will continue to monitor.

## 2021-03-07 NOTE — Progress Notes (Signed)
Pharmacy's updated assessment recommends Lovenox injections after discharge for patient.  Discussed update with patient and had patient to watch "Lovenox Self Injection video".  Questions answered.  Will continue to monitor as needed.

## 2021-03-07 NOTE — Progress Notes (Signed)
Progress Note: General Surgery Service   Chief Complaint/Subjective: UGI with no emptying of pouch over 5 minutes, no leak or other complication.  Objective: Vital signs in last 24 hours: Temp:  [98.5 F (36.9 C)-98.9 F (37.2 C)] 98.6 F (37 C) (09/02 1328) Pulse Rate:  [75-100] 75 (09/02 1328) Resp:  [18] 18 (09/02 1328) BP: (115-141)/(58-86) 116/80 (09/02 1328) SpO2:  [96 %-100 %] 100 % (09/02 1328) Last BM Date: 03/06/21  Intake/Output from previous day: 09/01 0701 - 09/02 0700 In: 1185.5 [P.O.:190; I.V.:995.5] Out: 200 [Emesis/NG output:200] Intake/Output this shift: Total I/O In: 384.5 [P.O.:60; I.V.:324.5] Out: -    GI: Abd Incisions c/d/I w/ glue; no palpable hepatosplenomegaly  Lab Results: CBC  Recent Labs    03/05/21 0430 03/06/21 0446  WBC 10.6* 12.4*  HGB 11.7* 10.3*  HCT 36.1 31.6*  PLT 241 232    BMET Recent Labs    03/04/21 1522  CREATININE 0.85    PT/INR No results for input(s): LABPROT, INR in the last 72 hours. ABG No results for input(s): PHART, HCO3 in the last 72 hours.  Invalid input(s): PCO2, PO2  Anti-infectives: Anti-infectives (From admission, onward)    Start     Dose/Rate Route Frequency Ordered Stop   03/04/21 0830  cefoTEtan (CEFOTAN) 2 g in sodium chloride 0.9 % 100 mL IVPB        2 g 200 mL/hr over 30 Minutes Intravenous On call to O.R. 03/04/21 0826 03/04/21 1100       Medications: Scheduled Meds:  acetaminophen  1,000 mg Oral Q8H   Or   acetaminophen (TYLENOL) oral liquid 160 mg/5 mL  1,000 mg Oral Q8H   enoxaparin (LOVENOX) injection  30 mg Subcutaneous Q12H   enoxaparin   Does not apply Once   lisdexamfetamine  20 mg Oral Daily   metoCLOPramide (REGLAN) injection  10 mg Intravenous Q6H   pantoprazole (PROTONIX) IV  40 mg Intravenous QHS   Ensure Max Protein  2 oz Oral Q2H   Continuous Infusions:   PRN Meds:.morphine injection, ondansetron (ZOFRAN) IV, oxyCODONE, simethicone  Assessment/Plan: s/p  Procedure(s): XI ROBOT ASSISTED ROUX-EN-Y UPPER GI ENDOSCOPY 03/04/2021  Presumed edema at [place] anastomosis causing nausea/vomiting postop requiring few extra days for admission Pace liquid intake, goal is 4 oz per hour, slowly increase to goal Once patient meets goal of 4 oz per hour, okay for discharge home Discharge paperwork completed, including lovenox prescription  Nausea, abdominal pain, tachycardia all improved today, hopefully she is turning the corner.   LOS: 3 days     Felicie Morn, MD  [facility] Use [email or web address] to contact on call provider

## 2021-03-07 NOTE — Plan of Care (Signed)
  Problem: Education: Goal: Knowledge of General Education information will improve Description: Including pain rating scale, medication(s)/side effects and non-pharmacologic comfort measures Outcome: Progressing   Problem: Health Behavior/Discharge Planning: Goal: Ability to manage health-related needs will improve Outcome: Progressing   Problem: Clinical Measurements: Goal: Ability to maintain clinical measurements within normal limits will improve Outcome: Progressing Goal: Will remain free from infection Outcome: Progressing Goal: Diagnostic test results will improve Outcome: Progressing Goal: Respiratory complications will improve Outcome: Progressing Goal: Cardiovascular complication will be avoided Outcome: Progressing   Problem: Activity: Goal: Risk for activity intolerance will decrease Outcome: Progressing   Problem: Nutrition: Goal: Adequate nutrition will be maintained Outcome: Progressing   Problem: Coping: Goal: Level of anxiety will decrease Outcome: Progressing   Problem: Elimination: Goal: Will not experience complications related to bowel motility Outcome: Progressing Goal: Will not experience complications related to urinary retention Outcome: Progressing   Problem: Pain Managment: Goal: General experience of comfort will improve Outcome: Progressing   Problem: Safety: Goal: Ability to remain free from injury will improve Outcome: Progressing   Problem: Skin Integrity: Goal: Risk for impaired skin integrity will decrease Outcome: Progressing   Problem: Education: Goal: Ability to state signs and symptoms to report to health care provider will improve Outcome: Progressing Goal: Knowledge of the prescribed self-care regimen will improve Outcome: Progressing Goal: Knowledge of discharge needs will improve Outcome: Progressing   Problem: Activity: Goal: Ability to tolerate increased activity will improve Outcome: Progressing   Problem:  Bowel/Gastric: Goal: Gastrointestinal status for postoperative course will improve Outcome: Progressing Goal: Occurrences of nausea will decrease Outcome: Progressing   Problem: Coping: Goal: Development of coping mechanisms to deal with changes in body function or appearance will improve Outcome: Progressing   Problem: Fluid Volume: Goal: Maintenance of adequate hydration will improve Outcome: Progressing   Problem: Nutritional: Goal: Nutritional status will improve Outcome: Progressing   Problem: Clinical Measurements: Goal: Will show no signs or symptoms of venous thromboembolism Outcome: Progressing Goal: Will remain free from infection Outcome: Progressing Goal: Will show no signs of GI Leak Outcome: Progressing   Problem: Respiratory: Goal: Will regain and/or maintain adequate ventilation Outcome: Progressing   Problem: Pain Management: Goal: Pain level will decrease Outcome: Progressing   Problem: Skin Integrity: Goal: Demonstration of wound healing without infection will improve Outcome: Progressing

## 2021-03-07 NOTE — Discharge Summary (Signed)
Patient ID: Vanessa Wilcox [id number] y.o. 1980/12/09  03/04/2021  Discharge date and time: 03/09/2021  Admitting Physician: Inger  Discharge Physician: Nickola Major Stechschulte  Admission Diagnoses: Morbid obesity with BMI of 50.0-59.9, adult ([place]) [E66.01, Z68.43] Patient Active Problem List   Diagnosis Date Noted   Morbid obesity with BMI of 50.0-59.9, adult ([place]) 03/04/2021   Difficult intravenous access 01/28/2021   Orthopnea 12/17/2020   Snoring 12/17/2020   Binge eating disorder 44/31/5400   Metabolic syndrome 86/76/1950   Depression 08/13/2020   Family history of lung cancer    Family history of multiple myeloma    Vitamin D deficiency 05/29/2020   Family history of ovarian cancer 05/06/2020   Family history of breast cancer 05/06/2020   Class 3 severe obesity with serious comorbidity and body mass index (BMI) of 50.0 to 59.9 in adult [facility]) 05/25/2019   Prediabetes 03/20/2019   HTN (hypertension), benign 02/13/2019     Discharge Diagnoses: Morbid obesity Patient Active Problem List   Diagnosis Date Noted   Morbid obesity with BMI of 50.0-59.9, adult ([place]) 03/04/2021   Difficult intravenous access 01/28/2021   Orthopnea 12/17/2020   Snoring 12/17/2020   Binge eating disorder 93/26/7124   Metabolic syndrome 58/03/9832   Depression 08/13/2020   Family history of lung cancer    Family history of multiple myeloma    Vitamin D deficiency 05/29/2020   Family history of ovarian cancer 05/06/2020   Family history of breast cancer 05/06/2020   Class 3 severe obesity with serious comorbidity and body mass index (BMI) of 50.0 to 59.9 in adult [facility]) 05/25/2019   Prediabetes 03/20/2019   HTN (hypertension), benign 02/13/2019    Operations: Procedure(s): XI ROBOT ASSISTED ROUX-EN-Y UPPER GI ENDOSCOPY  Admission Condition: good  Discharged Condition: good  Indication for Admission: Morbid obesity  Hospital Course: 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  She was slow to recover and dealt with nausea and vomiting in the hospital, likely related to edema at the gastrojejunal anastomosis.  These symptoms passed and she was able to tolerate a liquid diet and then was discharged from the hospital.  Consults: None  Significant Diagnostic Studies: UGI 03/07/2021  Treatments: surgery: as above  Disposition: Home  Patient Instructions:  Allergies as of 03/07/2021   No Known Allergies      Medication List     TAKE these medications    acetaminophen 500 MG tablet Commonly known as: TYLENOL Take 1,000 mg by mouth every 6 (six) hours as needed for moderate pain or headache. What changed: Another medication with the same name was added. Make sure you understand how and when to take each.   acetaminophen 500 MG tablet Commonly known as: TYLENOL Take 2 tablets (1,000 mg total) by mouth every 8 (eight) hours for 5 days. What changed: You were already taking a medication with the same name, and this prescription was added. Make sure you understand how and when to take each.   ASHWAGANDHA PO Take 2 tablets by mouth every evening.   enoxaparin 60 MG/0.6ML injection Commonly known as: LOVENOX Inject 0.6 mLs (60 mg total) into the skin every 12 (twelve) hours for 14 days.   gabapentin 100 MG capsule Commonly known as: NEURONTIN Take 2 capsules (200 mg total) by mouth every 12 (twelve) hours.   lisdexamfetamine 20 MG capsule Commonly known as: Vyvanse Take 1 capsule (20 mg total) by mouth daily.   norethindrone 0.35 MG tablet Commonly known as: [employer]  Take 1 tablet by mouth daily.   ondansetron 4 MG disintegrating tablet Commonly known as: ZOFRAN-ODT Dissolve 1 tablet (4 mg total) by mouth every 6 (six) hours as needed for nausea or vomiting.   oxyCODONE 5 MG immediate release tablet Commonly known as: Oxy IR/ROXICODONE Take 1 tablet (5 mg total) by mouth every 6 (six) hours as needed for severe pain.    Ozempic (1 MG/DOSE) 4 MG/3ML Sopn Generic drug: Semaglutide (1 MG/DOSE) Inject 1 mg into the skin once a week. Notes to patient: Monitor Blood Sugar Frequently and keep a log for primary care physician, you may need to adjust medication dosage with rapid weight loss.     pantoprazole 40 MG tablet Commonly known as: PROTONIX Take 1 tablet (40 mg total) by mouth daily.   Vitamin D (Ergocalciferol) 1.25 MG (50000 UNIT) Caps capsule Commonly known as: DRISDOL Take 1 capsule (50,000 Units total) by mouth every 7 (seven) days. What changed: when to take this        Activity: no heavy lifting for 4 weeks Diet:  per bariatric protocol Wound Care: keep wound clean and dry  Follow-up:  With Dr. Thermon Leyland  Signed: Nickola Major Stechschulte General, Bariatric, & Minimally Invasive Surgery [facility] Surgery, [place]   03/07/2021, 2:47 PM

## 2021-03-08 DIAGNOSIS — Z9884 Bariatric surgery status: Secondary | ICD-10-CM

## 2021-03-08 DIAGNOSIS — K9189 Other postprocedural complications and disorders of digestive system: Secondary | ICD-10-CM

## 2021-03-08 MED ORDER — PANTOPRAZOLE SODIUM 40 MG PO TBEC
40.0000 mg | DELAYED_RELEASE_TABLET | Freq: Every day | ORAL | Status: DC
  Administered 2021-03-08 – 2021-03-09 (×2): 40 mg via ORAL
  Filled 2021-03-08: qty 1

## 2021-03-08 NOTE — Progress Notes (Signed)
Vanessa Wilcox [id number] 04/12/1981  CARE TEAM:  PCP: Donald Prose, MD  Outpatient Care Team: Patient Care Team: Donald Prose, MD as PCP - General (Family Medicine) Stechschulte, Nickola Major, MD as Consulting Physician (Surgery)  Inpatient Treatment Team: Treatment Team: Attending Provider: Felicie Morn, MD; Utilization Review: Alease Medina, RN; Registered Nurse: Ladoris Gene, RN; Social Worker: Lowella Curb, LCSW   Problem List:   Active Problems:   Morbid obesity with BMI of 50.0-59.9, adult ([facility])   4 Days Post-Op  03/04/2021  Postoperative Diagnosis: morbid obesity, BMI 52   Surgical Procedure: XI ROBOT ASSISTED ROUX-EN-Y: [id number] (CPT) UPPER GI ENDOSCOPY: [id number]    Operative Team Members:  Surgeon(s) and Role:    * Stechschulte, Nickola Major, MD - Primary    * Clovis Riley, MD - Assisting     Assessment  Severe dysphagia from the edema and narrowing of gastrojejunostomy from gastric bypass slowly resolving  [facility] Stay = 4 days)  Plan:  -Continue bariatric diet plan for now.  Diuresis x1.  PPI.  VTE prophylaxis- SCDs, etc  mobilize as tolerated to help recovery  Disposition:  Disposition:  The patient is from: Home  Anticipate discharge to:  Home  Anticipated Date of Discharge is:  September 4,2022    Barriers to discharge:  Pending Clinical improvement (more likely than not)  Patient currently is NOT MEDICALLY STABLE for discharge from the hospital from a surgery standpoint.      25 minutes spent in review, evaluation, examination, counseling, and coordination of care.   I have reviewed this patient's available data, including medical history, events of note, physical examination and test results as part of my evaluation.  A significant portion of that time was spent in counseling.  Care during the described time interval was provided by me.  03/08/2021    Subjective: (Chief complaint)  Patient feeling much better.  Not  having burning chest pain or dysphagia with liquids.  Able to tolerate 1 ounce at a time.  Sitting up in chair.  Mild soreness at right sided port sites.    Objective:  Vital signs:  Vitals:   03/07/21 1328 03/07/21 1807 03/07/21 2058 03/08/21 0618  BP: 116/80 119/66 (!) 151/67 116/65  Pulse: 75 75 86 95  Resp: '18 19 18 18  '$ Temp: 98.6 F (37 C) 98.6 F (37 C) 98.6 F (37 C) 98.1 F (36.7 C)  TempSrc: Oral Oral Oral Oral  SpO2: 100% 100% 100% 96%  Weight:      Height:        Last BM Date: 03/06/21  Intake/Output   Yesterday:  09/02 0701 - 09/03 0700 In: 571.3 [P.O.:160; I.V.:411.3] Out: -  This shift:  No intake/output data recorded.  Bowel function:  Flatus: YES  BM:  YES  Drain: (No drain)   Physical Exam:  General: Pt awake/alert in no acute distress.  Sitting up in chair smiling. Eyes: PERRL, normal EOM.  Sclera clear.  No icterus Neuro: CN II-XII intact w/o focal sensory/motor deficits. Lymph: No head/neck/groin lymphadenopathy Psych:  No delerium/psychosis/paranoia.  Oriented x 4 HENT: Normocephalic, Mucus membranes moist.  No thrush Neck: Supple, No tracheal deviation.  No obvious thyromegaly Chest: No pain to chest wall compression.  Good respiratory excursion.  No audible wheezing CV:  Pulses intact.  Regular rhythm.  No major extremity edema MS: Normal AROM mjr joints.  No obvious deformity  Abdomen: Soft.  Nondistended.  Mildly tender at incisions only.  No  evidence of peritonitis.  No incarcerated hernias.  Ext:   No deformity.  No mjr edema.  No cyanosis Skin: No petechiae / purpurea.  No major sores.  Warm and dry    Results:   Cultures: Recent Results (from the past 720 hour(s))  SARS Coronavirus 2 (TAT 6-24 hrs)     Status: None   Collection Time: 02/28/21 12:00 AM  Result Value Ref Range Status   SARS Coronavirus 2 RESULT: NEGATIVE  Final    Comment: RESULT: NEGATIVESARS-CoV-2 INTERPRETATION:A NEGATIVE  test result means that  SARS-CoV-2 RNA was not present in the specimen above the limit of detection of this test. This does not preclude a possible SARS-CoV-2 infection and should not be used as the  sole basis for patient management decisions. Negative results must be combined with clinical observations, patient history, and epidemiological information. Optimum specimen types and timing for peak viral levels during infections caused by SARS-CoV-2  have not been determined. Collection of multiple specimens or types of specimens may be necessary to detect virus. Improper specimen collection and handling, sequence variability under primers/probes, or organism present below the limit of detection may  lead to false negative results. Positive and negative predictive values of testing are highly dependent on prevalence. False negative test results are more likely when prevalence of disease is high.The expected result is NEGATIVE.Fact S heet for  Healthcare Providers: LocalChronicle.no Sheet for Patients: SalonLookup.es Reference Range - Negative     Labs: No results found for this or any previous visit (from the past 48 hour(s)).  Imaging / Studies: DG UGI W SINGLE CM (SOL OR THIN BA)  Addendum Date: 03/07/2021   ADDENDUM REPORT: 03/07/2021 09:28 ADDITIONAL FINDINGS: ADDITIONAL FINDINGS The gastric pouch (which fills with contrast) is medial adjacent to the presumed stomach mass with thickened gastric wall. Findings conveyed toPAUL STECHSCHULTE on 03/07/2021  at09:26. Electronically Signed   By: Suzy Bouchard M.D.   On: 03/07/2021 09:28   Result Date: 03/07/2021 CLINICAL DATA:  Post bariatric surgery. Robotic assisted Roux-en-Y surgery 3 days prior. Vomiting. EXAM: UPPER GI SERIES WITH KUB TECHNIQUE: After obtaining a scout radiograph a routine upper GI series was performed using water-soluble contrast (approximately 35 mL) FLUOROSCOPY TIME:  Fluoroscopy Time:  1 minute  6 seconds Radiation Exposure Index (if provided by the fluoroscopic device): 95.2 Number of Acquired Spot Images: 2 COMPARISON:  CT 10/03/2016 FINDINGS: Initial KUB demonstrates a rounded density in the LEFT upper quadrant measuring 10.5 by 8.5 cm with central gas. This is favored to represent stomach with thickened gastric wall and central gastric gas. The uniformly thickened gastric wall measures 2.4 cm. This presumed thickened stomach is immediately lateral to a line of surgical staples which curves along the medial border of this upper quadrant mass. Patient drank sips of water-soluble contrast. Contrast flowed readily into the gastric pouch without obstruction. The gastric pouch is distended by the water-soluble contrast. Contrast failed to flow through the gastrojejunostomy. There is beaking at the presumed location of the gastrojejunostomy seen best on lateral projection. Multiple sips were taken in multiple obliquities. Approximately 5 minutes time elapsed during wedge no contrast flowed through the gastrojejunostomy. IMPRESSION: 1. No flow contrast through the gastrojejunostomy over the course of exam. 2. Contrast readily flowed into the gastric pouch through the GE junction. 3. No evidence of leak water-soluble contrast. 4. On scout exam, large upper quadrant mass presumably represents the stomach with uniform thickened gastric wall (description above). Contact to ordering physician initiated at 9:09  am  03/07/2021. Electronically Signed: By: Suzy Bouchard M.D. On: 03/07/2021 09:10    Medications / Allergies: per chart  Antibiotics: Anti-infectives (From admission, onward)    Start     Dose/Rate Route Frequency Ordered Stop   03/04/21 0830  cefoTEtan (CEFOTAN) 2 g in sodium chloride 0.9 % 100 mL IVPB        2 g 200 mL/hr over 30 Minutes Intravenous On call to O.R. 03/04/21 0826 03/04/21 1100         Note: Portions of this report may have been transcribed using voice recognition  software. Every effort was made to ensure accuracy; however, inadvertent computerized transcription errors may be present.   Any transcriptional errors that result from this process are unintentional.    Adin Hector, MD, FACS, MASCRS Esophageal, Gastrointestinal & Colorectal Surgery Robotic and Minimally Invasive Surgery  [facility], [place]  [place]. [address] [place], [place] [address] [phone] Fax [phone] Main  CONTACT INFORMATION:  Weekday (9AM-5PM): Call [facility] main office at [phone]  Weeknight (5PM-9AM) or Weekend/Holiday: Check [email or web address] (password " [email or web address]") for General Surgery [facility] coverage  (Please, do not use SecureChat as it is not reliable communication to operating surgeons for immediate patient care)      03/08/2021  10:27 AM

## 2021-03-09 MED ORDER — ONDANSETRON 4 MG PO TBDP: 4.0000 mg | ORAL_TABLET | Freq: Four times a day (QID) | ORAL | 2 refills | Status: DC | PRN

## 2021-03-09 MED ORDER — OXYCODONE HCL 5 MG PO TABS: 5.0000 mg | ORAL_TABLET | Freq: Four times a day (QID) | ORAL | 0 refills | Status: DC | PRN

## 2021-03-09 MED ORDER — ENOXAPARIN SODIUM 60 MG/0.6ML IJ SOSY: 60.0000 mg | PREFILLED_SYRINGE | Freq: Two times a day (BID) | INTRAMUSCULAR | 0 refills | Status: DC

## 2021-03-09 MED ORDER — GABAPENTIN 100 MG PO CAPS: 200.0000 mg | ORAL_CAPSULE | Freq: Two times a day (BID) | ORAL | 1 refills | Status: DC

## 2021-03-09 NOTE — Plan of Care (Signed)

## 2021-03-09 NOTE — Plan of Care (Signed)
  Problem: Education: ?Goal: Knowledge of General Education information will improve ?Description: Including pain rating scale, medication(s)/side effects and non-pharmacologic comfort measures ?Outcome: Completed/Met ?  ?Problem: Health Behavior/Discharge Planning: ?Goal: Ability to manage health-related needs will improve ?Outcome: Completed/Met ?  ?Problem: Clinical Measurements: ?Goal: Ability to maintain clinical measurements within normal limits will improve ?Outcome: Completed/Met ?Goal: Will remain free from infection ?Outcome: Completed/Met ?Goal: Diagnostic test results will improve ?Outcome: Completed/Met ?Goal: Respiratory complications will improve ?Outcome: Completed/Met ?Goal: Cardiovascular complication will be avoided ?Outcome: Completed/Met ?  ?Problem: Activity: ?Goal: Risk for activity intolerance will decrease ?Outcome: Completed/Met ?  ?Problem: Nutrition: ?Goal: Adequate nutrition will be maintained ?Outcome: Completed/Met ?  ?Problem: Coping: ?Goal: Level of anxiety will decrease ?Outcome: Completed/Met ?  ?Problem: Elimination: ?Goal: Will not experience complications related to bowel motility ?Outcome: Completed/Met ?Goal: Will not experience complications related to urinary retention ?Outcome: Completed/Met ?  ?Problem: Pain Managment: ?Goal: General experience of comfort will improve ?Outcome: Completed/Met ?  ?Problem: Safety: ?Goal: Ability to remain free from injury will improve ?Outcome: Completed/Met ?  ?Problem: Skin Integrity: ?Goal: Risk for impaired skin integrity will decrease ?Outcome: Completed/Met ?  ?Problem: Education: ?Goal: Ability to state signs and symptoms to report to health care provider will improve ?Outcome: Completed/Met ?Goal: Knowledge of the prescribed self-care regimen will improve ?Outcome: Completed/Met ?Goal: Knowledge of discharge needs will improve ?Outcome: Completed/Met ?  ?Problem: Activity: ?Goal: Ability to tolerate increased activity will  improve ?Outcome: Completed/Met ?  ?Problem: Bowel/Gastric: ?Goal: Gastrointestinal status for postoperative course will improve ?Outcome: Completed/Met ?Goal: Occurrences of nausea will decrease ?Outcome: Completed/Met ?  ?Problem: Coping: ?Goal: Development of coping mechanisms to deal with changes in body function or appearance will improve ?Outcome: Completed/Met ?  ?Problem: Fluid Volume: ?Goal: Maintenance of adequate hydration will improve ?Outcome: Completed/Met ?  ?Problem: Nutritional: ?Goal: Nutritional status will improve ?Outcome: Completed/Met ?  ?Problem: Clinical Measurements: ?Goal: Will show no signs or symptoms of venous thromboembolism ?Outcome: Completed/Met ?Goal: Will remain free from infection ?Outcome: Completed/Met ?Goal: Will show no signs of GI Leak ?Outcome: Completed/Met ?  ?Problem: Respiratory: ?Goal: Will regain and/or maintain adequate ventilation ?Outcome: Completed/Met ?  ?Problem: Pain Management: ?Goal: Pain level will decrease ?Outcome: Completed/Met ?  ?Problem: Skin Integrity: ?Goal: Demonstration of wound healing without infection will improve ?Outcome: Completed/Met ?  ?

## 2021-03-09 NOTE — Progress Notes (Signed)
Pt was discharged home today. Instructions were reviewed with patient, and questions were answered. Pt was taken to main entrance via wheelchair by NT.  

## 2021-03-11 ENCOUNTER — Other Ambulatory Visit (HOSPITAL_COMMUNITY): Payer: Self-pay

## 2021-03-12 ENCOUNTER — Telehealth (HOSPITAL_COMMUNITY): Payer: Self-pay | Admitting: *Deleted

## 2021-03-12 NOTE — Telephone Encounter (Signed)
1.  Tell me about your pain and pain management? Pt denies any current pain. Pt c/o right lower abdominal incisional pain with movement and exertion.  Pt states that she has only taken pain medication once yesterday. Discussed with patient to try and splint her abdomen with changing positions to assist with the discomfort.  Encouraged pt to try options and/or contact [facility] if still concerned.   2.  Let's talk about fluid intake.  How much total fluid are you taking in? Pt states that she is working to meet goal of 64 oz of fluid today.  Pt has consumed 34 fl oz with 1 protein shake, protein yogurt, bottled water, and Powerade. Pt plans to drink rest of protein, and fluids for the remainder of the day to meet goal.  Pt instructed to assess status and suggestions daily utilizing Hydration Action Plan on discharge folder and to call [facility] if in the "red zone".   3.  How much protein have you taken in the last 2 days? Pt states that she is working to meet goal of goal of 60g of protein today.  Pt has already consumed 1 protein shake.  Pt plans to drink remainder of protein throughout the rest of the day to meet goal.  4.  Have you had nausea?  Tell me about when have experienced nausea and what you did to help? Pt denies nausea.   5.  Has the frequency or color changed with your urine? Pt states that she is urinating "fine" with no changes in frequency or urgency.     6.  Tell me what your incisions look like? "Incisions look fine". Pt denies a fever, chills.  Pt states incisions are not swollen, open, or draining.  Pt encouraged to call [facility] if incisions change.   7.  Have you been passing gas? BM? Pt states that she has not had a BM since Friday.  Pt instructed to take either Miralax or MoM as instructed per "Gastric Bypass/Sleeve Discharge Home Care Instructions".  Pt has already taken a dose of Miralax yesterday and plans to take a dose today. Pt to call surgeon's office if not able to have BM with  medication.   8.  If a problem or question were to arise who would you call?  Do you know contact numbers for [place], [facility], and [facility]? Pt denies dehydration symptoms.  Pt can describe s/sx of dehydration.  Pt knows to call [facility] for surgical, [facility] for nutrition, and [place] for non-urgent questions or concerns.   9.  How has the walking going? Pt states she is walking around and able to be active without difficulty.   10. Are you still using your incentive spirometer?  If so, how often? Pt states that she is "using her I.S about every hour". Pt encouraged to use incentive spirometer, at least 10x every hour while awake until she sees the surgeon.  11.  How are your vitamins and calcium going?  How are you taking them? Pt states that she is taking her calcium supplement without difficulty.  Pt states the bariatric vitamin to arrive tomorrow.  Reminded patient that the first 30 days post-operatively are important for successful recovery.  Practice good hand hygiene, wearing a mask when appropriate (since optional in most places), and minimizing exposure to people who live outside of the home, especially if they are exhibiting any respiratory, GI, or illness-like symptoms.

## 2021-03-13 ENCOUNTER — Ambulatory Visit (INDEPENDENT_AMBULATORY_CARE_PROVIDER_SITE_OTHER): Payer: BC Managed Care – PPO | Admitting: Family Medicine

## 2021-03-13 ENCOUNTER — Other Ambulatory Visit: Payer: Self-pay

## 2021-03-13 ENCOUNTER — Encounter (INDEPENDENT_AMBULATORY_CARE_PROVIDER_SITE_OTHER): Payer: Self-pay | Admitting: Family Medicine

## 2021-03-13 VITALS — BP 115/75 | HR 93 | Temp 98.3°F | Ht 63.0 in | Wt 277.0 lb

## 2021-03-13 DIAGNOSIS — E8881 Metabolic syndrome: Secondary | ICD-10-CM

## 2021-03-13 DIAGNOSIS — Z9884 Bariatric surgery status: Secondary | ICD-10-CM | POA: Diagnosis not present

## 2021-03-13 DIAGNOSIS — Z6841 Body Mass Index (BMI) 40.0 and over, adult: Secondary | ICD-10-CM | POA: Diagnosis not present

## 2021-03-18 ENCOUNTER — Other Ambulatory Visit: Payer: Self-pay

## 2021-03-18 ENCOUNTER — Encounter: Payer: BC Managed Care – PPO | Attending: Surgery | Admitting: Skilled Nursing Facility1

## 2021-03-18 DIAGNOSIS — Z6841 Body Mass Index (BMI) 40.0 and over, adult: Secondary | ICD-10-CM | POA: Diagnosis present

## 2021-03-18 NOTE — Progress Notes (Signed)
Chief Complaint:   OBESITY Vanessa Wilcox is here to discuss her progress with her obesity treatment plan along with follow-up of her obesity related diagnoses.   Today's visit was #: 62 Starting weight: 306 lbs Starting date: 02/09/2019 Today's weight: 277 lbs Today's date: 03/13/2021 Weight change since last visit: 14 lbs Total lbs lost to date: 29 lbs Body mass index is 49.07 kg/m.  Total weight loss percentage to date: -9.48%  Current Meal Plan: keeping a food journal and adhering to recommended goals of 1300 calories and 95 grams of protein for 0% of the time.  Current Exercise Plan: Walking for 45 minutes 2-3 times per week. Current Anti-Obesity Medications: Ozempic 1 mg subcutaneously weekly. Side effects: None.  Interim History:  Vanessa Wilcox's goal weight is 180-190 pounds.  She has been seeing a therapist and says that she likes her.  She reports that bariatric surgery went well.  Assessment/Plan:   1. Metabolic syndrome Starting goal: Lose 7-10% of starting weight. She will continue to focus on protein-rich, low simple carbohydrate foods. We reviewed the importance of hydration, regular exercise for stress reduction, and restorative sleep.  We will continue to check lab work every 3 months, with 10% weight loss, or should any other concerns arise.  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  Went well.  She is currently on liquids only.  Vanessa Wilcox is at risk for malnutrition due to her previous bariatric surgery.   3. Class 3 severe obesity with serious comorbidity and body mass index (BMI) of 45.0 to 49.9 in adult, unspecified obesity type Vanessa Wilcox)  Course: Vanessa Wilcox is currently in the action stage of change. As such, her goal is to continue with weight loss efforts.   Nutrition goals:  S/P bariatric surgyer - currently on liquids.   Exercise goals:  Gentle walking.  Behavioral modification strategies: increasing lean protein intake, decreasing simple carbohydrates,  increasing vegetables, and increasing water intake.  Vanessa Wilcox has agreed to follow-up with our clinic in 4 weeks. She was informed of the importance of frequent follow-up visits to maximize her success with intensive lifestyle modifications for her multiple health conditions.   Objective:   Blood pressure 115/75, pulse 93, temperature 98.3 F (36.8 C), temperature source Oral, height '5\' 3"'$  (1.6 m), weight 277 lb (125.6 kg), SpO2 96 %. Body mass index is 49.07 kg/m.  General: Cooperative, alert, well developed, in no acute distress. HEENT: Conjunctivae and lids unremarkable. Cardiovascular: Regular rhythm.  Lungs: Normal work of breathing. Neurologic: No focal deficits.   Lab Results  Component Value Date   CREATININE 0.85 03/04/2021   BUN 20 02/25/2021   NA 135 02/25/2021   K 4.0 02/25/2021   CL 104 02/25/2021   CO2 26 02/25/2021   Lab Results  Component Value Date   ALT 20 02/25/2021   AST 20 02/25/2021   ALKPHOS 62 02/25/2021   BILITOT 0.7 02/25/2021   Lab Results  Component Value Date   HGBA1C 5.5 02/25/2021   HGBA1C 5.5 05/28/2020   HGBA1C 5.7 (H) 02/09/2019   Lab Results  Component Value Date   INSULIN 10.6 05/28/2020   INSULIN 21.5 02/09/2019   Lab Results  Component Value Date   VD25OH 29.9 (L) 05/28/2020   VD25OH 7.6 (L) 02/09/2019   Lab Results  Component Value Date   WBC 12.4 (H) 03/06/2021   HGB 10.3 (L) 03/06/2021   HCT 31.6 (L) 03/06/2021   MCV 83.6 03/06/2021   PLT 232 03/06/2021   Attestation  Statements:   Reviewed by clinician on day of visit: allergies, medications, problem list, medical history, surgical history, family history, social history, and previous encounter notes.  I, Water quality scientist, CMA, am acting as transcriptionist for Briscoe Deutscher, DO  I have reviewed the above documentation for accuracy and completeness, and I agree with the above. Briscoe Deutscher, DO

## 2021-03-19 NOTE — Progress Notes (Signed)
2 Week Post-Operative Nutrition Class   Patient was seen on 03/18/2021 for Post-Operative Nutrition education at [facility].    Surgery date: 03/04/2021 Surgery type: sleeve or RYGB Start weight at [facility]: 297.9 pounds Weight today: 276 pounds Bowel Habits: Every day to every other day no complaints   Body Composition Scale 03/18/2021  Current Body Weight 276  Total Body Fat % 48.1  Visceral Fat 18  Fat-Free Mass % 51.8   Total Body Water % 40.4  Muscle-Mass lbs 31.2  BMI 49.7  Body Fat Displacement          Torso  lbs 82.4         Left Leg  lbs 16.4         Right Leg  lbs 16.4         Left Arm  lbs 8.2         Right Arm   lbs 8.2      The following the learning objectives were met by the patient during this course: Identifies Phase 3 (Soft, High Proteins) Dietary Goals and will begin from 2 weeks post-operatively to 2 months post-operatively Identifies appropriate sources of fluids and proteins  Identifies appropriate fat sources and healthy verses unhealthy fat types   States protein recommendations and appropriate sources post-operatively Identifies the need for appropriate texture modifications, mastication, and bite sizes when consuming solids Identifies appropriate fat consumption and sources Identifies appropriate multivitamin and calcium sources post-operatively Describes the need for physical activity post-operatively and will follow MD recommendations States when to call healthcare provider regarding medication questions or post-operative complications   Handouts given during class include: Phase 3A: Soft, High Protein Diet Handout Phase 3 High Protein Meals Healthy Fats   Follow-Up Plan: Patient will follow-up at [facility] in 6 weeks for 2 month post-op nutrition visit for diet advancement per MD.

## 2021-03-24 ENCOUNTER — Telehealth: Payer: Self-pay | Admitting: Skilled Nursing Facility1

## 2021-03-24 NOTE — Telephone Encounter (Signed)
RD called pt to verify fluid intake once starting soft, solid proteins 2 week post-bariatric surgery.   Daily Fluid intake:  Daily Protein intake: Bowel Habits:   Concerns/issues:    LVM 

## 2021-04-22 ENCOUNTER — Encounter (INDEPENDENT_AMBULATORY_CARE_PROVIDER_SITE_OTHER): Payer: Self-pay | Admitting: Family Medicine

## 2021-04-22 ENCOUNTER — Ambulatory Visit (INDEPENDENT_AMBULATORY_CARE_PROVIDER_SITE_OTHER): Payer: BC Managed Care – PPO | Admitting: Family Medicine

## 2021-04-22 ENCOUNTER — Other Ambulatory Visit: Payer: Self-pay

## 2021-04-22 VITALS — BP 126/84 | HR 78 | Temp 97.3°F | Ht 63.0 in | Wt 258.0 lb

## 2021-04-22 DIAGNOSIS — Z6841 Body Mass Index (BMI) 40.0 and over, adult: Secondary | ICD-10-CM | POA: Diagnosis not present

## 2021-04-22 DIAGNOSIS — F5081 Binge eating disorder: Secondary | ICD-10-CM | POA: Diagnosis not present

## 2021-04-22 DIAGNOSIS — Z9884 Bariatric surgery status: Secondary | ICD-10-CM

## 2021-04-24 NOTE — Progress Notes (Signed)
Chief Complaint:   OBESITY Vanessa Wilcox is here to discuss her progress with her obesity treatment plan along with follow-up of her obesity related diagnoses. See Medical Weight Management Flowsheet for complete bioelectrical impedance results.  Today's visit was #: 97 Starting weight: 306 lbs Starting date: 02/09/2019 Weight change since last visit: 19 lbs Total lbs lost to date: 48 lbs Total weight loss percentage to date: -15.69%  Nutrition Plan: Status post bariatric surgery/Increased protein. Activity: Walking for 30 minutes 5 times per week.   Interim History: Vanessa Wilcox is doing well.  She feels that she should have lost more weight by now.  She says it seemed to slow when she went back to work.  Assessment/Plan:   1. Binge eating disorder, with ADHD  Okay to restart Vyvanse.  She has 1/2 bottle at home, so will use that. The patient understands monitoring parameters and red flags.   2. History of Roux-en-Y gastric bypass - Taking once daily bariatric multivitamin, calcium three times daily, and vitamin D.   - Compliant with current stage of diet.   - Will see RD next week.   - Discussed ways to increase protein via food.   - Reviewed the importance of solidifying healthy habits when busy at work.  3. Obesity, current BMI 45.8  Course: Vanessa Wilcox is currently in the action stage of change. As such, her goal is to continue with weight loss efforts.   Nutrition goals: She has agreed to the Bariatric Plan.   Exercise goals:  As is.  Behavioral modification strategies: increasing lean protein intake, decreasing simple carbohydrates, increasing vegetables, and increasing water intake.  Vanessa Wilcox has agreed to follow-up with our clinic in 4 weeks. She was informed of the importance of frequent follow-up visits to maximize her success with intensive lifestyle modifications for her multiple health conditions.   Objective:   Blood pressure 126/84, pulse 78, temperature (!) 97.3 F (36.3  C), temperature source Oral, height 5\' 3"  (1.6 m), weight 258 lb (117 kg), SpO2 99 %. Body mass index is 45.7 kg/m.  General: Cooperative, alert, well developed, in no acute distress. HEENT: Conjunctivae and lids unremarkable. Cardiovascular: Regular rhythm.  Lungs: Normal work of breathing. Neurologic: No focal deficits.   Lab Results  Component Value Date   CREATININE 0.85 03/04/2021   BUN 20 02/25/2021   NA 135 02/25/2021   K 4.0 02/25/2021   CL 104 02/25/2021   CO2 26 02/25/2021   Lab Results  Component Value Date   ALT 20 02/25/2021   AST 20 02/25/2021   ALKPHOS 62 02/25/2021   BILITOT 0.7 02/25/2021   Lab Results  Component Value Date   HGBA1C 5.5 02/25/2021   HGBA1C 5.5 05/28/2020   HGBA1C 5.7 (H) 02/09/2019   Lab Results  Component Value Date   INSULIN 10.6 05/28/2020   INSULIN 21.5 02/09/2019   Lab Results  Component Value Date   VD25OH 29.9 (L) 05/28/2020   VD25OH 7.6 (L) 02/09/2019   Lab Results  Component Value Date   WBC 12.4 (H) 03/06/2021   HGB 10.3 (L) 03/06/2021   HCT 31.6 (L) 03/06/2021   MCV 83.6 03/06/2021   PLT 232 03/06/2021   Attestation Statements:   Reviewed by clinician on day of visit: allergies, medications, problem list, medical history, surgical history, family history, social history, and previous encounter notes.  Time spent on visit including pre-visit chart review and post-visit care and charting was 32 minutes.   I, Water quality scientist, CMA, am  acting as transcriptionist for Briscoe Deutscher, DO  I have reviewed the above documentation for accuracy and completeness, and I agree with the above. -  Briscoe Deutscher, DO, MS, FAAFP, DABOM - Family and Bariatric Medicine.

## 2021-04-28 ENCOUNTER — Encounter: Payer: BC Managed Care – PPO | Attending: Surgery | Admitting: Skilled Nursing Facility1

## 2021-04-28 ENCOUNTER — Other Ambulatory Visit: Payer: Self-pay

## 2021-04-28 DIAGNOSIS — Z6841 Body Mass Index (BMI) 40.0 and over, adult: Secondary | ICD-10-CM | POA: Insufficient documentation

## 2021-04-28 NOTE — Progress Notes (Signed)
Bariatric Nutrition Follow-Up Visit Medical Nutrition Therapy   NUTRITION ASSESSMENT    Anthropometrics  Start weight at [facility]: 297.9 lbs (date: 01/02/2021)  Weight: 257.2 pounds   Clinical  Medical hx: HTN, anxiety, depression, ADHD   Labs: vitamin D 29.9, A1C 5.5 Notable signs/symptoms: N/A Any previous deficiencies? Vitamin D  Body Composition Scale 03/18/2021 04/28/2021  Current Body Weight 276 257.2  Total Body Fat % 48.1 46.1  Visceral Fat 18 16  Fat-Free Mass % 51.8 53.8   Total Body Water % 40.4 41.4  Muscle-Mass lbs 31.2 31.6  BMI 49.7 45.1  Body Fat Displacement           Torso  lbs 82.4 73.5         Left Leg  lbs 16.4 14.7         Right Leg  lbs 16.4 14.7         Left Arm  lbs 8.2 7.3         Right Arm   lbs 8.2 7.3     Lifestyle & Dietary Hx  Pt states she is over protein shakes right now.   Pt states she is having trouble getting in her protein and fluid due to getting in enough protein and shakes. Pt states she is going to healthy weight and wellness.   Estimated daily fluid intake: 30 oz Estimated daily protein intake: 50 g Supplements: multi and calcium Current average weekly physical activity: gym 3-4 days a week 45-1 hour   24-Hr Dietary Recall First Meal: greek yogurt Snack:   Second Meal: greek yogurt Snack:   Third Meal: chicken Snack:  Beverages: water, water + flavoring   Post-Op Goals/ Signs/ Symptoms Using straws: no Drinking while eating: no Chewing/swallowing difficulties: no Changes in vision: no Changes to mood/headaches: no Hair loss/changes to skin/nails: no Difficulty focusing/concentrating: no Sweating: no Limb weakness: no Dizziness/lightheadedness: no Palpitations: no  Carbonated/caffeinated beverages: no N/V/D/C/Gas: bowel movement every 3-4 days Abdominal pain: no Dumping syndrome: no    NUTRITION DIAGNOSIS  Overweight/obesity ([place]-3.3) related to past poor dietary habits and physical inactivity as evidenced by  completed bariatric surgery and following dietary guidelines for continued weight loss and healthy nutrition status.     NUTRITION INTERVENTION Nutrition counseling (C-1) and education (E-2) to facilitate bariatric surgery goals, including: Diet advancement to the next phase (phase 4) now including non starchy vegetables  The importance of consuming adequate calories as well as certain nutrients daily due to the body's need for essential vitamins, minerals, and fats The importance of daily physical activity and to reach a goal of at least 150 minutes of moderate to vigorous physical activity weekly (or as directed by their physician) due to benefits such as increased musculature and improved lab values The importance of intuitive eating specifically learning hunger-satiety cues and understanding the importance of learning a new body: The importance of mindful eating to avoid grazing behaviors   Goals: -Continue to aim for a minimum of 64 fluid ounces 7 days a week with at least 30 ounces being plain water  -Eat non-starchy vegetables 2 times a day 7 days a week  -Start out with soft cooked vegetables today and tomorrow; if tolerated begin to eat raw vegetables or cooked including salads  -Eat your 3 ounces of protein first then start in on your non-starchy vegetables; once you understand how much of your meal leads to satisfaction and not full while still eating 3 ounces of protein and non-starchy vegetables you can  eat them in any order   -Continue to aim for 30 minutes of activity at least 5 times a week  -Do NOT cook with/add to your food: alfredo sauce, cheese sauce, barbeque sauce, ketchup, fat back, butter, bacon grease, grease, Crisco, OR SUGAR  -drink a bottle of water with each calcium; drink any fluid you want that will make you drink     Handouts Provided Include  Phase 4  Learning Style & Readiness for Change Teaching method utilized: Visual & Auditory  Demonstrated degree  of understanding via: Teach Back  Readiness Level: contemplative Barriers to learning/adherence to lifestyle change: unidentified   RD's Notes for Next Visit Assess adherence to pt chosen goals    MONITORING & EVALUATION Dietary intake, weekly physical activity, body weight  Next Steps Patient is to follow-up in 3 months

## 2021-05-14 ENCOUNTER — Other Ambulatory Visit: Payer: Self-pay

## 2021-05-14 ENCOUNTER — Encounter: Payer: Self-pay | Admitting: Obstetrics and Gynecology

## 2021-05-14 ENCOUNTER — Ambulatory Visit (INDEPENDENT_AMBULATORY_CARE_PROVIDER_SITE_OTHER): Payer: BC Managed Care – PPO | Admitting: Obstetrics and Gynecology

## 2021-05-14 DIAGNOSIS — Z01419 Encounter for gynecological examination (general) (routine) without abnormal findings: Secondary | ICD-10-CM | POA: Diagnosis not present

## 2021-05-14 DIAGNOSIS — R2232 Localized swelling, mass and lump, left upper limb: Secondary | ICD-10-CM | POA: Diagnosis not present

## 2021-05-14 MED ORDER — NORETHINDRONE 0.35 MG PO TABS: 1.0000 | ORAL_TABLET | Freq: Every day | ORAL | 3 refills | Status: DC

## 2021-05-14 NOTE — Patient Instructions (Signed)

## 2021-05-14 NOTE — Progress Notes (Signed)
40 y.o. G3P0021 Married [place] American female here for annual exam.    Lost 40 pounds since her weight loss surgery, August 30.   Menses are monthly and are light.  No menses in October.  Stopped taking her pills a week before surgery and then restarted 2 weeks after surgery.  She does want to continue her POPs.   Patient had a pelvic ultrasound done 06/25/20 which showed a uterine fibroid and normal ovaries.  PCP:  Donald Prose, MD   Patient's last menstrual period was 03/17/2021 (exact date).     Period Pattern: (!) Irregular (Irregular since bariatric surgery 03-04-21)     Sexually active: No. Not in 2-3 months The current method of family planning is POPs Exercising: Yes.     weights Smoker:  no  Health Maintenance: Pap:   07-18-19 pap Neg:Neg HR HPV, 10-05-16 Neg:Neg HR HPV History of abnormal Pap:  no MMG: 05-28-20 3D/Neg/BiRads1 Colonoscopy:  n/a BMD:   n/a  Result  n/a TDaP:  up to date Gardasil:   no HIV: Neg in the past Hep C: Neg in the past Screening Labs:  PCP.  Flu vaccine:  declined.  Covid vaccine:  due for her booster.   reports that she has never smoked. She has never used smokeless tobacco. She reports that she does not currently use alcohol. She reports that she does not use drugs.  Past Medical History:  Diagnosis Date   ADHD    Anxiety    Back pain    Depression    Family history of lung cancer    Family history of multiple myeloma    Pre-diabetes    STD (sexually transmitted disease)    Tx'd for chlamydia over 20 years ago    Past Surgical History:  Procedure Laterality Date   ASPIRATION BIOPSY     fluid around heart- negative results in 2000   BIOPSY  01/28/2021   Procedure: BIOPSY;  Surgeon: Felicie Morn, MD;  Location: [facility];  Service: General;;   ESOPHAGOGASTRODUODENOSCOPY N/A 01/28/2021   Procedure: ESOPHAGOGASTRODUODENOSCOPY (EGD);  Surgeon: Felicie Morn, MD;  Location: [facility];  Service: General;   Laterality: N/A;   fluid on heart  2000   chest tube placed   IUD removal under sedation     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   [facility] N/A 03/04/2021   Procedure: UPPER GI ENDOSCOPY;  Surgeon: Felicie Morn, MD;  Location: [facility];  Service: General;  Laterality: N/A;    Current Outpatient Medications  Medication Sig Dispense Refill   ASHWAGANDHA PO Take 2 tablets by mouth every evening.     lisdexamfetamine (VYVANSE) 20 MG capsule Take 1 capsule (20 mg total) by mouth daily. 30 capsule 0   norethindrone (MICRONOR) 0.35 MG tablet Take 1 tablet by mouth daily.     pantoprazole (PROTONIX) 40 MG tablet Take 40 mg by mouth daily.     Vitamin D, Ergocalciferol, (DRISDOL) 1.25 MG (50000 UNIT) CAPS capsule Take 1 capsule (50,000 Units total) by mouth every 7 (seven) days. (Patient taking differently: Take 50,000 Units by mouth every Sunday.) 4 capsule 0   No current facility-administered medications for this visit.    Family History  Problem Relation Age of Onset   Diabetes Mother    High blood pressure Mother    Depression Mother    Sleep apnea Mother    Obesity Mother    Hypertension Mother    Hyperlipidemia  Mother    Thyroid disease Mother    Lung cancer Mother 56   High Cholesterol Father    Cancer Father        unknown type - leg amputated   Alcoholism Father    Ovarian cancer Half-Sister 20       Dec ovarian Cancer   Breast cancer Half-Sister 53   Diabetes Maternal Grandmother    Stroke Maternal Grandfather    Multiple myeloma Cousin 37       in remission (maternal first cousin)    Review of Systems  All other systems reviewed and are negative.  Exam:   BP (P) 134/80   Pulse (P) 78   Ht (P) 5' 2.5" (1.588 m)   Wt (P) 255 lb (115.7 kg)   LMP 03/17/2021 (Exact Date)   BMI (P) 45.90 kg/m     General appearance: alert, cooperative and appears stated age Head: normocephalic, without obvious abnormality, atraumatic Neck:  no adenopathy, supple, symmetrical, trachea midline and thyroid normal to inspection and palpation Lungs: clear to auscultation bilaterally Breasts: right - normal appearance, no masses or tenderness, No nipple retraction or dimpling, No nipple discharge or bleeding, No axillary adenopathy Left - normal appearance, no masses or tenderness, No nipple retraction or dimpling, No nipple discharge or bleeding, 2 cm superficial axillary mass, nontender. Heart: regular rate and rhythm Abdomen: soft, non-tender; no masses, no organomegaly Extremities: extremities normal, atraumatic, no cyanosis or edema Skin: skin color, texture, turgor normal. No rashes or lesions Lymph nodes: cervical, supraclavicular, and axillary nodes normal. Neurologic: grossly normal  Pelvic: External genitalia:  no lesions              No abnormal inguinal nodes palpated.              Urethra:  normal appearing urethra with no masses, tenderness or lesions              Bartholins and Skenes: normal                 Vagina: normal appearing vagina with normal color and discharge, no lesions              Cervix: no lesions.  Menstrual flow noted.               Pap taken: no. Bimanual Exam:  Uterus:  normal size, contour, position, consistency, mobility, non-tender              Adnexa: no mass, fullness, tenderness              Rectal exam: yes.  Confirms.              Anus:  normal sphincter tone, no lesions  Chaperone was present for exam:  Estill Bamberg, CMA  Assessment:   Well woman visit with gynecologic exam. FH breast and ovarian cancer in sister.  Patient has had genetic counseling but not testing. Patient's lifetime risk of breast cancer is 10.7%. Uterine fibroid.  Status post roux-en-y surgery.   Successful weight loss.  Left axillary mass 2 cm, I suspect sebaceous cyst.   Plan: Will schedule bilateral dx mammogram and left breast/axillary [name] at the [facility].  Self breast awareness reviewed. Pap and HR HPV  2026. Guidelines for Calcium, Vitamin D, regular exercise program including cardiovascular and weight bearing exercise. Refill of Micronor x 1 year. Patient and I discussed pelvic ultrasound to evaluate her ovaries, and she would like her next routine pelvic US to  be in 2023.  This can be done sooner for any concerns.  Follow up annually and prn.    After visit summary provided.

## 2021-05-16 ENCOUNTER — Telehealth: Payer: Self-pay | Admitting: Obstetrics and Gynecology

## 2021-05-16 DIAGNOSIS — R2232 Localized swelling, mass and lump, left upper limb: Secondary | ICD-10-CM

## 2021-05-16 NOTE — Telephone Encounter (Signed)
Please schedule bilateral diagnostic mammogram and [facility]  Patient has a 2 cm left axillary mass.

## 2021-05-20 NOTE — Telephone Encounter (Signed)
Patient scheduled on 06/17/21 at 1:30pm at the [facility]. Patient informed with time and date.

## 2021-05-21 ENCOUNTER — Other Ambulatory Visit: Payer: Self-pay

## 2021-05-21 ENCOUNTER — Encounter (INDEPENDENT_AMBULATORY_CARE_PROVIDER_SITE_OTHER): Payer: Self-pay | Admitting: Family Medicine

## 2021-05-21 ENCOUNTER — Ambulatory Visit (INDEPENDENT_AMBULATORY_CARE_PROVIDER_SITE_OTHER): Payer: BC Managed Care – PPO | Admitting: Family Medicine

## 2021-05-21 VITALS — BP 117/78 | HR 65 | Temp 97.9°F | Ht 63.0 in | Wt 246.0 lb

## 2021-05-21 DIAGNOSIS — F5081 Binge eating disorder: Secondary | ICD-10-CM | POA: Diagnosis not present

## 2021-05-21 DIAGNOSIS — E8881 Metabolic syndrome: Secondary | ICD-10-CM

## 2021-05-21 DIAGNOSIS — Z9884 Bariatric surgery status: Secondary | ICD-10-CM

## 2021-05-21 DIAGNOSIS — Z6841 Body Mass Index (BMI) 40.0 and over, adult: Secondary | ICD-10-CM

## 2021-05-26 NOTE — Progress Notes (Signed)
Chief Complaint:   OBESITY Vanessa Wilcox is here to discuss her progress with her obesity treatment plan along with follow-up of her obesity related diagnoses. See Medical Weight Management Flowsheet for complete bioelectrical impedance results.  Today's visit was #: 79 Starting weight: 306 lbs Starting date: 02/09/2019 Weight change since last visit: 12 lbs Total lbs lost to date: 60 lbs Total weight loss percentage to date: -19.61%  Nutrition Plan: Bariatric Plan for 100% of the time. Activity: Walking 10,000 steps and going to the gym for 30-45 minutes 3-5 times per week.  Interim History: Vanessa Wilcox had bariatric surgery on August 30 - she is 10 weeks out. Still needs help with meal ideas. I will send handouts/info to her mychart.  Assessment/Plan:   1. Metabolic syndrome Starting goal: Lose 7-10% of starting weight. She will continue to focus on protein-rich, low simple carbohydrate foods. We reviewed the importance of hydration, regular exercise for stress reduction, and restorative sleep.  We will continue to check lab work every 3 months, with 10% weight loss, or should any other concerns arise.  2. History of Roux-en-Y gastric bypass Vanessa Wilcox is 10 weeks out from having her Roux-en-Y gastric bypass, performed by Dr. Thermon Leyland.  She is taking a bariatric multivitamin ans is compliant with her current stage of diet.  3. Binge eating disorder, with ADHD  [name] restarted Vyvanse 20 mg at last office visit. The current medical regimen is effective;  continue present plan and medications.  4. Obesity with current BMI of 43.6  Course: Vanessa Wilcox is currently in the action stage of change. As such, her goal is to continue with weight loss efforts.   Nutrition goals: She has agreed to the Bariatric Plan.   Exercise goals:  As is.  Behavioral modification strategies: increasing lean protein intake, decreasing simple carbohydrates, increasing vegetables, and increasing water  intake.  Vanessa Wilcox has agreed to follow-up with our clinic in 4 weeks. She was informed of the importance of frequent follow-up visits to maximize her success with intensive lifestyle modifications for her multiple health conditions.   Objective:   Blood pressure 117/78, pulse 65, temperature 97.9 F (36.6 C), temperature source Oral, height 5\' 3"  (1.6 m), weight 246 lb (111.6 kg), SpO2 99 %. Body mass index is 43.58 kg/m.  General: Cooperative, alert, well developed, in no acute distress. HEENT: Conjunctivae and lids unremarkable. Cardiovascular: Regular rhythm.  Lungs: Normal work of breathing. Neurologic: No focal deficits.   Lab Results  Component Value Date   CREATININE 0.85 03/04/2021   BUN 20 02/25/2021   NA 135 02/25/2021   K 4.0 02/25/2021   CL 104 02/25/2021   CO2 26 02/25/2021   Lab Results  Component Value Date   ALT 20 02/25/2021   AST 20 02/25/2021   ALKPHOS 62 02/25/2021   BILITOT 0.7 02/25/2021   Lab Results  Component Value Date   HGBA1C 5.5 02/25/2021   HGBA1C 5.5 05/28/2020   HGBA1C 5.7 (H) 02/09/2019   Lab Results  Component Value Date   INSULIN 10.6 05/28/2020   INSULIN 21.5 02/09/2019   Lab Results  Component Value Date   VD25OH 29.9 (L) 05/28/2020   VD25OH 7.6 (L) 02/09/2019   Lab Results  Component Value Date   WBC 12.4 (H) 03/06/2021   HGB 10.3 (L) 03/06/2021   HCT 31.6 (L) 03/06/2021   MCV 83.6 03/06/2021   PLT 232 03/06/2021   Attestation Statements:   Reviewed by clinician on day of visit: allergies, medications, problem  list, medical history, surgical history, family history, social history, and previous encounter notes.  I, Water quality scientist, CMA, am acting as transcriptionist for Briscoe Deutscher, DO  I have reviewed the above documentation for accuracy and completeness, and I agree with the above. -  Briscoe Deutscher, DO, MS, FAAFP, DABOM - Family and Bariatric Medicine.

## 2021-05-31 NOTE — Telephone Encounter (Signed)
Please place in mammogram hold and then close this encounter.

## 2021-06-01 ENCOUNTER — Encounter (INDEPENDENT_AMBULATORY_CARE_PROVIDER_SITE_OTHER): Payer: Self-pay | Admitting: Family Medicine

## 2021-06-05 NOTE — Telephone Encounter (Signed)
Placed in mammo hold. 

## 2021-06-17 ENCOUNTER — Ambulatory Visit
Admission: RE | Admit: 2021-06-17 | Discharge: 2021-06-17 | Disposition: A | Discharge status: Home / Self Care | Payer: BC Managed Care – PPO | Source: Ambulatory Visit | Attending: Obstetrics and Gynecology | Admitting: Obstetrics and Gynecology

## 2021-06-17 DIAGNOSIS — R2232 Localized swelling, mass and lump, left upper limb: Secondary | ICD-10-CM

## 2021-06-25 ENCOUNTER — Other Ambulatory Visit: Payer: Self-pay

## 2021-06-25 ENCOUNTER — Ambulatory Visit (INDEPENDENT_AMBULATORY_CARE_PROVIDER_SITE_OTHER): Payer: BC Managed Care – PPO | Admitting: Family Medicine

## 2021-06-25 ENCOUNTER — Encounter (INDEPENDENT_AMBULATORY_CARE_PROVIDER_SITE_OTHER): Payer: Self-pay | Admitting: Family Medicine

## 2021-06-25 VITALS — BP 120/76 | HR 85 | Temp 98.1°F | Ht 63.0 in | Wt 236.0 lb

## 2021-06-25 DIAGNOSIS — N912 Amenorrhea, unspecified: Secondary | ICD-10-CM

## 2021-06-25 DIAGNOSIS — F5081 Binge eating disorder: Secondary | ICD-10-CM

## 2021-06-25 DIAGNOSIS — E559 Vitamin D deficiency, unspecified: Secondary | ICD-10-CM

## 2021-06-25 DIAGNOSIS — Z9884 Bariatric surgery status: Secondary | ICD-10-CM | POA: Diagnosis not present

## 2021-06-25 DIAGNOSIS — Z6841 Body Mass Index (BMI) 40.0 and over, adult: Secondary | ICD-10-CM

## 2021-06-25 MED ORDER — VITAMIN D (ERGOCALCIFEROL) 1.25 MG (50000 UNIT) PO CAPS: 50000.0000 [IU] | ORAL_CAPSULE | ORAL | 0 refills | Status: DC

## 2021-06-25 MED ORDER — LISDEXAMFETAMINE DIMESYLATE 20 MG PO CAPS: 20.0000 mg | ORAL_CAPSULE | Freq: Every day | ORAL | 0 refills | Status: DC

## 2021-06-26 LAB — VITAMIN D 25 HYDROXY (VIT D DEFICIENCY, FRACTURES): Vit D, 25-Hydroxy: 55.9 ng/mL (ref 30.0–100.0)

## 2021-06-26 LAB — CBC WITH DIFFERENTIAL/PLATELET
Basophils Absolute: 0 10*3/uL (ref 0.0–0.2)
Basos: 0 %
EOS (ABSOLUTE): 0.1 10*3/uL (ref 0.0–0.4)
Eos: 2 %
Hemoglobin: 12.6 g/dL (ref 11.1–15.9)
Immature Grans (Abs): 0 10*3/uL (ref 0.0–0.1)
Immature Granulocytes: 0 %
Lymphocytes Absolute: 1.8 10*3/uL (ref 0.7–3.1)
Lymphs: 22 %
MCH: 25.5 pg — ABNORMAL LOW (ref 26.6–33.0)
MCHC: 31.8 g/dL (ref 31.5–35.7)
MCV: 80 fL (ref 79–97)
Monocytes Absolute: 0.6 10*3/uL (ref 0.1–0.9)
Monocytes: 7 %
Neutrophils Absolute: 5.6 10*3/uL (ref 1.4–7.0)
Neutrophils: 69 %
Platelets: 224 10*3/uL (ref 150–450)
RBC: 4.94 x10E6/uL (ref 3.77–5.28)
RDW: 15 % (ref 11.7–15.4)
WBC: 8.2 10*3/uL (ref 3.4–10.8)

## 2021-06-26 LAB — ANEMIA PANEL
Ferritin: 29 ng/mL (ref 15–150)
Folate, Hemolysate: 337 ng/mL
Folate, RBC: 851 ng/mL (ref >498)
Hematocrit: 39.6 % (ref 34.0–46.6)
Iron Saturation: 8 % — CL (ref 15–55)
Iron: 26 ug/dL — ABNORMAL LOW (ref 27–159)
Retic Ct Pct: 1.1 % (ref 0.6–2.6)
Total Iron Binding Capacity: 345 ug/dL (ref 250–450)
UIBC: 319 ug/dL (ref 131–425)
Vitamin B-12: 642 pg/mL (ref 232–1245)

## 2021-06-26 LAB — BETA HCG QUANT (REF LAB): hCG Quant: <1 m[IU]/mL

## 2021-06-26 NOTE — Progress Notes (Signed)
Chief Complaint:   OBESITY Vanessa Wilcox is here to discuss her progress with her obesity treatment plan along with follow-up of her obesity related diagnoses. See Medical Weight Management Flowsheet for complete bioelectrical impedance results.  Today's visit was #: 17 Starting weight: 306 lbs Starting date: 02/09/2019 Weight change since last visit: 10 lbs Total lbs lost to date: 70 lbs Total weight loss percentage to date: -22.88%  Nutrition Plan: Bariatric Plan for 90% of the time. Activity: Walking/strength training for 45-60 minutes 5 times per week.   Interim History: Vanessa Wilcox is doing more tracking, and she says it is helping her.  She has had no menses for almost 2 months.  She reports that she is getting enough protein and water.  Assessment/Plan:   1. Amenorrhea LMP was November 7-11.  Plan:  Will check beta hCG today.  - Beta hCG quant (ref lab)  2. Vitamin D deficiency At goal.  She is taking vitamin D 50,000 IU weekly.  Plan: Continue to take prescription Vitamin D @50 ,000 IU every week as prescribed.  Will check vitamin D level today.  Lab Results  Component Value Date   VD25OH 55.9 06/25/2021   VD25OH 29.9 (L) 05/28/2020   VD25OH 7.6 (L) 02/09/2019   - Refill Vitamin D, Ergocalciferol, (DRISDOL) 1.25 MG (50000 UNIT) CAPS capsule; Take 1 capsule (50,000 Units total) by mouth every 7 (seven) days.  Dispense: 4 capsule; Refill: 0 - VITAMIN D 25 Hydroxy (Vit-D Deficiency, Fractures)  3. History of Roux-en-Y gastric bypass Vanessa Wilcox is at risk for malnutrition due to her previous bariatric surgery. Will check labs today, as per below.  Counseling You may need to eat 3 meals and 2 snacks, or 5 small meals each day in order to reach your protein and calorie goals.  Allow at least 15 minutes for each meal so that you can eat mindfully. Listen to your body so that you do not overeat. For most people, your sleeve or pouch will comfortably hold 4-6 ounces. Eat foods from  all food groups. This includes fruits and vegetables, grains, dairy, and meat and other proteins. Include a protein-rich food at every meal and snack, and eat the protein food first.  You should be taking a Bariatric Multivitamin as well as calcium.   - Anemia panel - CBC with Differential/Platelet  4. Binge eating disorder Vanessa Wilcox is taking Vyvanse 20 mg daily for BED.  Will refill today, as per below.  - Refill lisdexamfetamine (VYVANSE) 20 MG capsule; Take 1 capsule (20 mg total) by mouth daily.  Dispense: 30 capsule; Refill: 0  I have consulted the [place] Controlled Substances Registry for this patient, and feel the risk/benefit ratio today is favorable for proceeding with this prescription for a controlled substance. The patient understands monitoring parameters and red flags.   5. Obesity, current BMI 41.8  Course: Vanessa Wilcox is currently in the action stage of change. As such, her goal is to continue with weight loss efforts.   Nutrition goals: She has agreed to the Bariatric Plan.   Exercise goals:  As is.  Behavioral modification strategies: increasing lean protein intake, decreasing simple carbohydrates, increasing vegetables, and increasing water intake.  Vanessa Wilcox has agreed to follow-up with our clinic in 6-8 weeks. She was informed of the importance of frequent follow-up visits to maximize her success with intensive lifestyle modifications for her multiple health conditions.   Vanessa Wilcox was informed we would discuss her lab results at her next visit unless there is a critical  issue that needs to be addressed sooner. Vanessa Wilcox agreed to keep her next visit at the agreed upon time to discuss these results.  Objective:   Blood pressure 120/76, pulse 85, temperature 98.1 F (36.7 C), temperature source Oral, height 5\' 3"  (1.6 m), weight 236 lb (107 kg), SpO2 99 %. Body mass index is 41.81 kg/m.  General: Cooperative, alert, well developed, in no acute distress. HEENT: Conjunctivae and  lids unremarkable. Cardiovascular: Regular rhythm.  Lungs: Normal work of breathing. Neurologic: No focal deficits.   Lab Results  Component Value Date   CREATININE 0.85 03/04/2021   BUN 20 02/25/2021   NA 135 02/25/2021   K 4.0 02/25/2021   CL 104 02/25/2021   CO2 26 02/25/2021   Lab Results  Component Value Date   ALT 20 02/25/2021   AST 20 02/25/2021   ALKPHOS 62 02/25/2021   BILITOT 0.7 02/25/2021   Lab Results  Component Value Date   HGBA1C 5.5 02/25/2021   HGBA1C 5.5 05/28/2020   HGBA1C 5.7 (H) 02/09/2019   Lab Results  Component Value Date   INSULIN 10.6 05/28/2020   INSULIN 21.5 02/09/2019   Lab Results  Component Value Date   VD25OH 55.9 06/25/2021   VD25OH 29.9 (L) 05/28/2020   VD25OH 7.6 (L) 02/09/2019   Lab Results  Component Value Date   WBC 8.2 06/25/2021   HGB 12.6 06/25/2021   HCT 39.6 06/25/2021   MCV 80 06/25/2021   PLT 224 06/25/2021   Lab Results  Component Value Date   IRON 26 (L) 06/25/2021   TIBC 345 06/25/2021   FERRITIN 29 06/25/2021   Attestation Statements:   Reviewed by clinician on day of visit: allergies, medications, problem list, medical history, surgical history, family history, social history, and previous encounter notes.  I, Water quality scientist, CMA, am acting as transcriptionist for Briscoe Deutscher, DO  I have reviewed the above documentation for accuracy and completeness, and I agree with the above. -  Briscoe Deutscher, DO, MS, FAAFP, DABOM - Family and Bariatric Medicine.

## 2021-07-15 ENCOUNTER — Other Ambulatory Visit: Payer: Self-pay

## 2021-07-15 ENCOUNTER — Encounter: Payer: BC Managed Care – PPO | Attending: Surgery | Admitting: Skilled Nursing Facility1

## 2021-07-15 DIAGNOSIS — Z6841 Body Mass Index (BMI) 40.0 and over, adult: Secondary | ICD-10-CM | POA: Diagnosis present

## 2021-07-16 NOTE — Progress Notes (Signed)
Follow-up visit:  Post-Operative RYGB Surgery  Medical Nutrition Therapy:  Appt start time: 6:00pm end time:  7:00pm  Primary concerns today: Post-operative Bariatric Surgery Nutrition Management 6 Month Post-Op Class  Anthropometrics  Start weight at [facility]: 297.9 lbs (date: 01/02/2021)  Weight: 233.6 pounds   Clinical  Medical hx: HTN, anxiety, depression, ADHD   Labs: vitamin D 29.9, A1C 5.5 Notable signs/symptoms: N/A Any previous deficiencies? Vitamin D  Body Composition Scale 03/18/2021 04/28/2021 07/16/2021  Current Body Weight 276 257.2 233.6  Total Body Fat % 48.1 46.1 44.7  Visceral Fat 18 16 15   Fat-Free Mass % 51.8 53.8 55.2   Total Body Water % 40.4 41.4 42.1  Muscle-Mass lbs 31.2 31.6 30.4  BMI 49.7 45.1 42.5  Body Fat Displacement            Torso  lbs 82.4 73.5 64.6         Left Leg  lbs 16.4 14.7 12.9         Right Leg  lbs 16.4 14.7 12.9         Left Arm  lbs 8.2 7.3 6.4         Right Arm   lbs 8.2 7.3 6.4     Information Reviewed/ Discussed During Appointment: -Review of composition scale numbers -Fluid requirements (64-100 ounces) -Protein requirements (60-80g) -Strategies for tolerating diet -Advancement of diet to include Starchy vegetables -Barriers to inclusion of new foods -Inclusion of appropriate multivitamin and calcium supplements  -Exercise recommendations   Fluid intake: adequate   Medications: See List Supplementation: appropriate    Using straws: no Drinking while eating: no Having you been chewing well: yes Chewing/swallowing difficulties: no Changes in vision: no Changes to mood/headaches: no Hair loss/Cahnges to skin/Changes to nails: no Any difficulty focusing or concentrating: no Sweating: no Dizziness/Lightheaded: no Palpitations: no  Carbonated beverages: no N/V/D/C/GAS: no Abdominal Pain: no Dumping syndrome: no  Recent physical activity:  ADL's  Progress Towards Goal(s):  In Progress Teaching method utilized:  Visual & Auditory  Demonstrated degree of understanding via: Teach Back  Readiness Level: Action Barriers to learning/adherence to lifestyle change: none identified  Handouts given during visit include: Phase V diet Progression  Goals Sheet The Benefits of Exercise are endless..... Support Group Topics   Teaching Method Utilized:  Visual Auditory Hands on  Demonstrated degree of understanding via:  Teach Back   Monitoring/Evaluation:  Dietary intake, exercise, and body weight. Follow up in 3 months for 9 month post-op visit.

## 2021-08-13 ENCOUNTER — Ambulatory Visit (INDEPENDENT_AMBULATORY_CARE_PROVIDER_SITE_OTHER): Payer: BC Managed Care – PPO | Admitting: Family Medicine

## 2021-08-13 ENCOUNTER — Other Ambulatory Visit: Payer: Self-pay

## 2021-08-13 ENCOUNTER — Encounter (INDEPENDENT_AMBULATORY_CARE_PROVIDER_SITE_OTHER): Payer: Self-pay | Admitting: Family Medicine

## 2021-08-13 VITALS — BP 115/78 | HR 64 | Temp 98.2°F | Ht 63.0 in | Wt 225.0 lb

## 2021-08-13 DIAGNOSIS — E559 Vitamin D deficiency, unspecified: Secondary | ICD-10-CM

## 2021-08-13 DIAGNOSIS — R790 Abnormal level of blood mineral: Secondary | ICD-10-CM

## 2021-08-13 DIAGNOSIS — E669 Obesity, unspecified: Secondary | ICD-10-CM

## 2021-08-13 DIAGNOSIS — F5081 Binge eating disorder: Secondary | ICD-10-CM | POA: Diagnosis not present

## 2021-08-13 DIAGNOSIS — Z9884 Bariatric surgery status: Secondary | ICD-10-CM

## 2021-08-13 DIAGNOSIS — Z6841 Body Mass Index (BMI) 40.0 and over, adult: Secondary | ICD-10-CM

## 2021-08-13 DIAGNOSIS — Z6839 Body mass index (BMI) 39.0-39.9, adult: Secondary | ICD-10-CM

## 2021-08-14 MED ORDER — VITAMIN D (ERGOCALCIFEROL) 1.25 MG (50000 UNIT) PO CAPS: 50000.0000 [IU] | ORAL_CAPSULE | ORAL | 0 refills | Status: DC

## 2021-08-14 MED ORDER — LISDEXAMFETAMINE DIMESYLATE 10 MG PO CAPS: 10.0000 mg | ORAL_CAPSULE | Freq: Every day | ORAL | 0 refills | Status: DC

## 2021-08-14 NOTE — Progress Notes (Signed)
Chief Complaint:   OBESITY Vanessa Wilcox is here to discuss her progress with her obesity treatment plan along with follow-up of her obesity related diagnoses. See Medical Weight Management Flowsheet for complete bioelectrical impedance results.  Today's visit was #: 48 Starting weight: 306 lbs Starting date: 02/09/2019 Weight change since last visit: 11 lbs Total lbs lost to date: 81 lbs Total weight loss percentage to date: -26.47%  Nutrition Plan: Bariatric Plan for 80% of the time. Activity: Zumba/strength training/cardio for 30+ minutes 5 times per week.  Interim History: Vanessa Wilcox says she is feeling great!  She is eating more variety and is being careful to avoid simple carbohydrates.  She says she is exercising most days.    Assessment/Plan:   1. Vitamin D deficiency At goal. She is taking vitamin D 50,000 IU weekly.  Plan: Continue to take prescription Vitamin D @50 ,000 IU every week as prescribed. May decrease to every 2 weeks. Follow-up for routine testing of Vitamin D, at least 2-3 times per year to avoid over-replacement.  Lab Results  Component Value Date   VD25OH 55.9 06/25/2021   VD25OH 29.9 (L) 05/28/2020   VD25OH 7.6 (L) 02/09/2019   - Refill Vitamin D, Ergocalciferol, (DRISDOL) 1.25 MG (50000 UNIT) CAPS capsule; Take 1 capsule (50,000 Units total) by mouth every 7 (seven) days.  Dispense: 4 capsule; Refill: 0  2. Low ferritin Increase iron in diet.  Continue multivitamin.  3. History of Roux-en-Y gastric bypass Vanessa Wilcox is at risk for malnutrition due to her previous bariatric surgery.   Counseling You may need to eat 3 meals and 2 snacks, or 5 small meals each day in order to reach your protein and calorie goals.  Allow at least 15 minutes for each meal so that you can eat mindfully. Listen to your body so that you do not overeat. For most people, your sleeve or pouch will comfortably hold 4-6 ounces. Eat foods from all food groups. This includes fruits and  vegetables, grains, dairy, and meat and other proteins. Include a protein-rich food at every meal and snack, and eat the protein food first.  You should be taking a Bariatric Multivitamin as well as calcium.   4. Binge eating disorder Vanessa Wilcox is taking Vyvanse 20 mg daily for BED.    Plan:  Decrease Vyvanse to 10 mg daily.  Will refill for 3 months, as per below.  The goals for treatment of BED are to reduce eating binges and to achieve healthy eating habits. Because binge eating can correlate with negative emotions, treatment may also address any other mental-health issues, such as depression.  People who binge eat feel as if they don't have control over how much they eat and have feelings of guilt or self-loathing after a binge eating episode.  The FDA has approved Vyvanse as a treatment option for binge eating disorder. Vyvanse targets the brain's reward center by increasing the levels of dopamine and norepinephrine, the chemicals of the brain responsible for feelings of pleasure.  Mindful eating is the recommended nutritional approach to treating BED.   - Refill lisdexamfetamine (VYVANSE) 10 MG capsule; Take 1 capsule (10 mg total) by mouth daily before breakfast.  Dispense: 30 capsule; Refill: 0 - lisdexamfetamine (VYVANSE) 10 MG capsule; Take 1 capsule (10 mg total) by mouth daily before breakfast.  Dispense: 30 capsule; Refill: 0 - lisdexamfetamine (VYVANSE) 10 MG capsule; Take 1 capsule (10 mg total) by mouth daily before breakfast.  Dispense: 30 capsule; Refill: 0  I  have consulted the [place] Controlled Substances Registry for this patient, and feel the risk/benefit ratio today is favorable for proceeding with this prescription for a controlled substance. The patient understands monitoring parameters and red flags.   5. Obesity, current BMI 39.9  Course: Vanessa Wilcox is currently in the action stage of change. As such, her goal is to continue with weight loss efforts.   Nutrition goals: She  has agreed to the Bariatric Plan.   Exercise goals:  As is.  Behavioral modification strategies: increasing lean protein intake, decreasing simple carbohydrates, increasing vegetables, and increasing water intake.  Vanessa Wilcox has agreed to follow-up with our clinic in 4 weeks. She was informed of the importance of frequent follow-up visits to maximize her success with intensive lifestyle modifications for her multiple health conditions.   Objective:   Blood pressure 115/78, pulse 64, temperature 98.2 F (36.8 C), temperature source Oral, height 5\' 3"  (1.6 m), weight 225 lb (102.1 kg), SpO2 99 %. Body mass index is 39.86 kg/m.  General: Cooperative, alert, well developed, in no acute distress. HEENT: Conjunctivae and lids unremarkable. Cardiovascular: Regular rhythm.  Lungs: Normal work of breathing. Neurologic: No focal deficits.   Lab Results  Component Value Date   CREATININE 0.85 03/04/2021   BUN 20 02/25/2021   NA 135 02/25/2021   K 4.0 02/25/2021   CL 104 02/25/2021   CO2 26 02/25/2021   Lab Results  Component Value Date   ALT 20 02/25/2021   AST 20 02/25/2021   ALKPHOS 62 02/25/2021   BILITOT 0.7 02/25/2021   Lab Results  Component Value Date   HGBA1C 5.5 02/25/2021   HGBA1C 5.5 05/28/2020   HGBA1C 5.7 (H) 02/09/2019   Lab Results  Component Value Date   INSULIN 10.6 05/28/2020   INSULIN 21.5 02/09/2019   Lab Results  Component Value Date   VD25OH 55.9 06/25/2021   VD25OH 29.9 (L) 05/28/2020   VD25OH 7.6 (L) 02/09/2019   Lab Results  Component Value Date   WBC 8.2 06/25/2021   HGB 12.6 06/25/2021   HCT 39.6 06/25/2021   MCV 80 06/25/2021   PLT 224 06/25/2021   Lab Results  Component Value Date   IRON 26 (L) 06/25/2021   TIBC 345 06/25/2021   FERRITIN 29 06/25/2021   Attestation Statements:   Reviewed by clinician on day of visit: allergies, medications, problem list, medical history, surgical history, family history, social history, and previous  encounter notes.  I, Water quality scientist, CMA, am acting as transcriptionist for Briscoe Deutscher, DO  I have reviewed the above documentation for accuracy and completeness, and I agree with the above. -  Briscoe Deutscher, DO, MS, FAAFP, DABOM - Family and Bariatric Medicine.

## 2021-08-25 ENCOUNTER — Encounter (INDEPENDENT_AMBULATORY_CARE_PROVIDER_SITE_OTHER): Payer: Self-pay

## 2021-09-11 ENCOUNTER — Encounter (INDEPENDENT_AMBULATORY_CARE_PROVIDER_SITE_OTHER): Payer: Self-pay

## 2021-09-11 ENCOUNTER — Ambulatory Visit (INDEPENDENT_AMBULATORY_CARE_PROVIDER_SITE_OTHER): Payer: BC Managed Care – PPO | Admitting: Family Medicine

## 2021-10-13 ENCOUNTER — Encounter: Payer: BC Managed Care – PPO | Attending: Surgery | Admitting: Skilled Nursing Facility1

## 2021-10-13 DIAGNOSIS — E669 Obesity, unspecified: Secondary | ICD-10-CM

## 2021-10-13 DIAGNOSIS — I10 Essential (primary) hypertension: Secondary | ICD-10-CM | POA: Diagnosis not present

## 2021-10-13 DIAGNOSIS — Z713 Dietary counseling and surveillance: Secondary | ICD-10-CM | POA: Insufficient documentation

## 2021-10-13 NOTE — Progress Notes (Signed)
Follow-up visit:  Post-Operative RYGB Surgery ? ? ?Anthropometrics  ?Start weight at [facility]: 297.9 lbs (date: 01/02/2021)  ?Surgery date:  03/04/2021 ?Weight: 220.1 pounds ?  ?Clinical  ?Medical hx: HTN, anxiety, depression, ADHD   ?Labs: vitamin D 29.9, A1C 5.5 ?Notable signs/symptoms: N/A ?Any previous deficiencies? Vitamin D ? ?Body Composition Scale 03/18/2021 04/28/2021 07/16/2021 10/13/2021  ?Current Body Weight 276 257.2 233.6 220.1  ?Total Body Fat % 48.1 46.1 44.7 43.2  ?Visceral Fat '18 16 15 14  '$ ?Fat-Free Mass % 51.8 53.8 55.2 56.7  ? Total Body Water % 40.4 41.4 42.1 42.8  ?Muscle-Mass lbs 31.2 31.6 30.4 30.2  ?BMI 49.7 45.1 42.5 40.1  ?Body Fat Displacement      ?       Torso  lbs 82.4 73.5 64.6 58.9  ?       Left Leg  lbs 16.4 14.7 12.9 11.7  ?       Right Leg  lbs 16.4 14.7 12.9 11.7  ?       Left Arm  lbs 8.2 7.3 6.4 5.8  ?       Right Arm   lbs 8.2 7.3 6.4 5.8  ? ? ?Pt states she is still struggling with her water and states in the last month she has not been consistent with going to the gym. Pt states she gets 10,000 steps at work but had not been intentional with after work physical activity last month. ?Pt states she is doing more walking at home lately. ?Pt states about a year ago she lost her dad and siblings is has been grieving. ?Pt states she has not spoken with her therapist in about 2 months stating she is hopeful she will be back soon. ?Pt sates she has plans to get back into resistance training for increased physical activity.  ?Pt states for sweet craving she eats a sugar free popsicle.  ?Pt states she is not ready for bread and will add it in eventually.  ?Pt states she recognizes if she does not snack enough or get enough water in she will feel tired the next day. ?Pt is doing well and is motivated. ? ? ?24 hr recall: 1-2 yogurts daily ?First meal: protein shake ?Snack: pepperoni slices or yogurt ?Second meal: [place] and cheese wrap ?Snack: cheese and [place] meat and strawberries  ?Third  meal: chicken + green beans ?Snack:  ? ?Fluid intake: 50 oz minimum water, water  + flavoring, Gatorade zero ? ?Medications: See List ?Supplementation: multi and calcium  ? ? ?Using straws: no ?Drinking while eating: no ?Having you been chewing well: yes ?Chewing/swallowing difficulties: no ?Changes in vision: no ?Changes to mood/headaches: no ?Hair loss/Cahnges to skin/Changes to nails: no ?Any difficulty focusing or concentrating: no ?Sweating: no ?Dizziness/Lightheaded: no ?Palpitations: no  ?Carbonated beverages: no ?N/V/D/C/GAS: no ?Abdominal Pain: no ?Dumping syndrome: no ? ?Recent physical activity:  gym and walking ? ?Why you need complex carbohydrates: Whole grains and other complex carbohydrates are required to have a healthy diet. Whole grains provide fiber which can help with blood glucose levels and help keep you satiated. Fruits and starchy vegetables provide essential vitamins and minerals required for immune function, eyesight support, brain support, bone density, wound healing and many other functions within the body. According to the current evidenced based 2020-2025 Dietary Guidelines for Americans, complex carbohydrates are part of a healthy eating pattern which is associated with a decreased risk for type 2 diabetes, cancers, and cardiovascular disease.  ? ?Goals: ?-Continue  to strive for 64 oz of fluids per day ?-Add resistance training to physical activity ? ?Progress Towards Goal(s):  In Progress ?Teaching method utilized: Visual & Auditory  ?Demonstrated degree of understanding via: Teach Back  ?Readiness Level: Action ?Barriers to learning/adherence to lifestyle change: busy work schedule ? ? ?Teaching Method Utilized:  ?Visual ?Auditory ?Hands on ? ?Demonstrated degree of understanding via:  Teach Back  ? ?Monitoring/Evaluation:  Dietary intake, exercise, and body weight.  ? ?Pt to return for one year mark in August ? ?

## 2021-10-14 ENCOUNTER — Telehealth (INDEPENDENT_AMBULATORY_CARE_PROVIDER_SITE_OTHER): Payer: BC Managed Care – PPO | Admitting: Family Medicine

## 2021-10-14 ENCOUNTER — Encounter (INDEPENDENT_AMBULATORY_CARE_PROVIDER_SITE_OTHER): Payer: Self-pay | Admitting: Family Medicine

## 2021-10-14 DIAGNOSIS — E559 Vitamin D deficiency, unspecified: Secondary | ICD-10-CM | POA: Diagnosis not present

## 2021-10-14 DIAGNOSIS — E669 Obesity, unspecified: Secondary | ICD-10-CM | POA: Diagnosis not present

## 2021-10-14 DIAGNOSIS — Z6839 Body mass index (BMI) 39.0-39.9, adult: Secondary | ICD-10-CM

## 2021-10-14 DIAGNOSIS — F5081 Binge eating disorder: Secondary | ICD-10-CM

## 2021-10-14 DIAGNOSIS — Z9884 Bariatric surgery status: Secondary | ICD-10-CM

## 2021-10-14 MED ORDER — VITAMIN D (ERGOCALCIFEROL) 1.25 MG (50000 UNIT) PO CAPS: 50000.0000 [IU] | ORAL_CAPSULE | ORAL | 0 refills | Status: AC

## 2021-10-14 NOTE — Progress Notes (Addendum)
TeleHealth Visit:  Due to the COVID-19 pandemic, this visit was completed with telemedicine (audio/video) technology to reduce patient and provider exposure as well as to preserve personal protective equipment.   Vanessa Wilcox has verbally consented to this TeleHealth visit. The patient is located at home, the provider is located at home office. The participants in this visit include the listed provider and patient. The visit was conducted today via MyChart video.  OBESITY Vanessa Wilcox is here to discuss her progress with her obesity treatment plan along with follow-up of her obesity related diagnoses.   Today's visit was #: 38 Starting weight: 306 lbs Starting date: 02/09/2019 Weight at Nutritionist yesterday: 220 lbs (down 5 lbs) Today's date: 10/14/2021   Interim History: Feeling great. Reviewed visit with RD yesterday. Admits to adding more carbs and alcohol, but in moderation. No concerns. Vyvanse 10 mg not helpful enough for ADHD/reward-based eating. She would like to increase back to 20 mg. She has plenty at home already.  Nutrition Plan: Bariatric Plan for 90% of the time Hunger is well controlled. Cravings are moderately controlled.  Activity: Walking/strength training for 30-60 minutes 7 times per week.  Assessment/Plan:   Diagnoses and all orders for this visit:  Vitamin D deficiency At goal.   Plan: Continue to take prescription Vitamin D '@50'$ ,000 IU every week as prescribed.  Follow-up for routine testing of Vitamin D, at least 2-3 times per year to avoid over-replacement.  Lab Results  Component Value Date   VD25OH 55.9 06/25/2021   VD25OH 29.9 (L) 05/28/2020   VD25OH 7.6 (L) 02/09/2019   -     Vitamin D, Ergocalciferol, (DRISDOL) 1.25 MG (50000 UNIT) CAPS capsule; Take 1 capsule (50,000 Units total) by mouth every 7 (seven) days.  Binge eating disorder Improved. Will increase Vyvanse to 20 mg daily to reduce reward-based eating behaviors.  History of Roux-en-Y gastric  bypass Patients who undergo bariatric surgery may have inadequate weight loss (<20% of total body weight loss) or weight regain (>15% gain of initial weight loss) post bariatric surgery.  Current data suggests that 25-35% of persons who undergo bariatric surgery will have a significant weight gain within 2-5 years after the initial surgical date.  Weight loss medications serve as an adjunct to help patients to achieve and maintain a healthy weight. Medications compliment healthy lifestyle behaviors. If a patient has a superior response to medication (5-15% of total body loss), it is recommended to continue the medication indefinitely. The exception would be to DC prior to conception in women of childbearing age. Surg Obes Relat Dis. 2017 Mar;13(3):491-500.  Obesity, current BMI 39.9 Vanessa Wilcox is currently in the action stage of change. As such, her goal is to continue with weight loss efforts. She has agreed to continue her Bariatric Plan  Exercise goals: For substantial health benefits, adults should do at least 150 minutes (2 hours and 30 minutes) a week of moderate-intensity, or 75 minutes (1 hour and 15 minutes) a week of vigorous-intensity aerobic physical activity, or an equivalent combination of moderate- and vigorous-intensity aerobic activity. Aerobic activity should be performed in episodes of at least 10 minutes, and preferably, it should be spread throughout the week. Adults should also include muscle-strengthening activities that involve all major muscle groups on 2 or more days a week.  Behavioral modification strategies: increasing lean protein intake, decreasing simple carbohydrates, and increasing water intake.  Vanessa Wilcox has agreed to follow-up with our clinic in 6 weeks. She was informed of the importance of frequent follow-up  visits to maximize her success with intensive lifestyle modifications for her multiple health conditions.  Objective:   VITALS: Per patient if applicable, see  vitals. GENERAL: Alert and in no acute distress. CARDIOPULMONARY: No increased WOB. Speaking in clear sentences.  PSYCH: Pleasant and cooperative. Speech normal rate and rhythm. Affect is appropriate. Insight and judgement are appropriate. Attention is focused, linear, and appropriate.  NEURO: Oriented as arrived to appointment on time with no prompting.   Lab Results  Component Value Date   CREATININE 0.85 03/04/2021   BUN 20 02/25/2021   NA 135 02/25/2021   K 4.0 02/25/2021   CL 104 02/25/2021   CO2 26 02/25/2021   Lab Results  Component Value Date   ALT 20 02/25/2021   AST 20 02/25/2021   ALKPHOS 62 02/25/2021   BILITOT 0.7 02/25/2021   Lab Results  Component Value Date   HGBA1C 5.5 02/25/2021   HGBA1C 5.5 05/28/2020   HGBA1C 5.7 (H) 02/09/2019   Lab Results  Component Value Date   INSULIN 10.6 05/28/2020   INSULIN 21.5 02/09/2019   No results found for: TSH No results found for: CHOL, HDL, LDLCALC, LDLDIRECT, TRIG, CHOLHDL Lab Results  Component Value Date   WBC 8.2 06/25/2021   HGB 12.6 06/25/2021   HCT 39.6 06/25/2021   MCV 80 06/25/2021   PLT 224 06/25/2021   Lab Results  Component Value Date   IRON 26 (L) 06/25/2021   TIBC 345 06/25/2021   FERRITIN 29 06/25/2021   Attestation Statements:   Reviewed by clinician on day of visit: allergies, medications, problem list, medical history, surgical history, family history, social history, and previous encounter notes.

## 2022-02-11 ENCOUNTER — Encounter (INDEPENDENT_AMBULATORY_CARE_PROVIDER_SITE_OTHER): Payer: Self-pay

## 2022-02-16 ENCOUNTER — Encounter: Payer: BC Managed Care – PPO | Attending: Surgery | Admitting: Skilled Nursing Facility1

## 2022-02-16 ENCOUNTER — Encounter: Payer: Self-pay | Admitting: Skilled Nursing Facility1

## 2022-02-16 DIAGNOSIS — E669 Obesity, unspecified: Secondary | ICD-10-CM | POA: Diagnosis present

## 2022-02-16 NOTE — Progress Notes (Signed)
Follow-up visit:  Post-Operative RYGB Surgery   Anthropometrics  Start weight at [facility]: 297.9 lbs (date: 01/02/2021)  Surgery date:  03/04/2021 Weight: 205.1 pounds   Clinical  Medical hx: HTN, anxiety, depression, ADHD   Labs: vitamin D 29.9, A1C 5.5 Notable signs/symptoms: N/A Any previous deficiencies? Vitamin D  Body Composition Scale 03/18/2021 04/28/2021 07/16/2021 10/13/2021 02/16/2022  Current Body Weight 276 257.2 233.6 220.1 205.1  Total Body Fat % 48.1 46.1 44.7 43.2 41.3  Visceral Fat '18 16 15 14 12  '$ Fat-Free Mass % 51.8 53.8 55.2 56.7 58.6   Total Body Water % 40.4 41.4 42.1 42.8 43.8  Muscle-Mass lbs 31.2 31.6 30.4 30.2 30.1  BMI 49.7 45.1 42.5 40.1 37.3  Body Fat Displacement              Torso  lbs 82.4 73.5 64.6 58.9 52.4         Left Leg  lbs 16.4 14.7 12.9 11.7 10.4         Right Leg  lbs 16.4 14.7 12.9 11.7 10.4         Left Arm  lbs 8.2 7.3 6.4 5.8 5.2         Right Arm   lbs 8.2 7.3 6.4 5.8 5.2    Pt states she is very happy with her weight loss.  Pt states she has been going to her mental health therapist and goes to the weight loss clinic every 6-8 weeks.  Pt states her goals is to be 180 pounds.  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   Pt states orange juice makes her stomach hurt.   Pt states she only recently started skipping lunch stating she recognizes meal skipping is not good.   24 hr recall: First meal: cafe latte protein shake Snack: sometimes yogurt Second meal: skipped Snack: berries Third meal: salad + chicken + cheese + egg + cucumber + spinach or deli meat Snack:   Fluid intake: 50 oz minimum water, water  + flavoring, Gatorade zero  Medications: See List Supplementation: multi and calcium    Using straws: no Drinking while eating: no Having you been chewing well: yes Chewing/swallowing difficulties: no Changes in vision: no Changes to mood/headaches: no Hair loss/Cahnges to skin/Changes to  nails: no Any difficulty focusing or concentrating: no Sweating: no Dizziness/Lightheaded: no Palpitations: no  Carbonated beverages: no N/V/D/C/GAS: no Abdominal Pain: no Dumping syndrome: no  Recent physical activity:  8000 steps a day  Why you need complex carbohydrates: Whole grains and other complex carbohydrates are required to have a healthy diet. Whole grains provide fiber which can help with blood glucose levels and help keep you satiated. Fruits and starchy vegetables provide essential vitamins and minerals required for immune function, eyesight support, brain support, bone density, wound healing and many other functions within the body. According to the current evidenced based 2020-2025 Dietary Guidelines for Americans, complex carbohydrates are part of a healthy eating pattern which is associated with a decreased risk for type 2 diabetes, cancers, and cardiovascular disease.  Importance of vegetables To have an overall healthy diet, adult men and women are recommended to consume anywhere from 2-3 cups of vegetables daily. Vegetables provide a wide range of vitamins and minerals such as vitamin A, vitamin C, potassium, and folic acid. According to the [employer], including fruit and vegetables daily may reduce the risk of cardiovascular disease, certain cancers, and other non-communicable diseases. Encouraged patient to honor their  body's internal hunger and fullness cues.  Throughout the day, check in mentally and rate hunger. Stop eating when satisfied not full regardless of how much food is left on the plate.  Get more if still hungry 20-30 minutes later.  The key is to honor satisfaction so throughout the meal, rate fullness factor and stop when comfortably satisfied not physically full. The key is to honor hunger and fullness without any feelings of guilt or shame.  Pay attention to what the internal cues are, rather than any external factors. This will enhance the  confidence you have in listening to your own body and following those internal cues enabling you to increase how often you eat when you are hungry not out of appetite and stop when you are satisfied not full.  Encouraged pt to continue to eat balanced meals inclusive of non starchy vegetables 2 times a day 7 days a week Encouraged pt to choose lean protein sources: limiting beef, pork, sausage, hotdogs, and lunch meat Encourage pt to choose healthy fats such as plant based limiting animal fats Encouraged pt to continue to drink a minium 64 fluid ounces with half being plain water to satisfy proper hydration  Why physical activity is needed:  Physical activity has various benefits for the body such as reducing stress, helping control blood glucose levels, strengthening the heart, reducing incidence of cognitive impairment, and has a neuroprotective effect on memory function. Research reports that physical activity (whether it be aerobic, resistance training, or high intensity interval training) help control the level of reactive oxygen species which can lead to oxidative stress on the brain therefor possibly impairing memory, learning abilities, and other cognitive functions. The CDC recognizes that physical activity can help prevent premature death, may prevent the development of type 2 diabetes, different cancers, and heart disease. Not only are there are health benefits to physical activity, the CDC also reports that physical activity would decrease health care costs, increase property values, and people who are physically active generally have less sick days. Resource https://www-sciencedirect-com.libproxy.http://www.castillo-fisher.org/, CDC    Progress Towards Goal(s):  In Progress Teaching method utilized: Environmental health practitioner & Auditory  Demonstrated degree of understanding via: Teach Back  Readiness Level: Action Barriers to learning/adherence to lifestyle change: busy work  schedule   Teaching Method Utilized:  Ship broker Hands on  Demonstrated degree of understanding via:  Teach Back   Monitoring/Evaluation:  Dietary intake, exercise, and body weight.   Pt is to call or email with any future questions or concerns

## 2022-05-02 IMAGING — DX DG CHEST 2V
2 series · 2 of 2 positions shown · non-contrast
Comparison: 07/10/2012

CLINICAL DATA: Preop evaluation for upcoming bariatric surgery

EXAM:
CHEST - 2 VIEW

[chest pa]
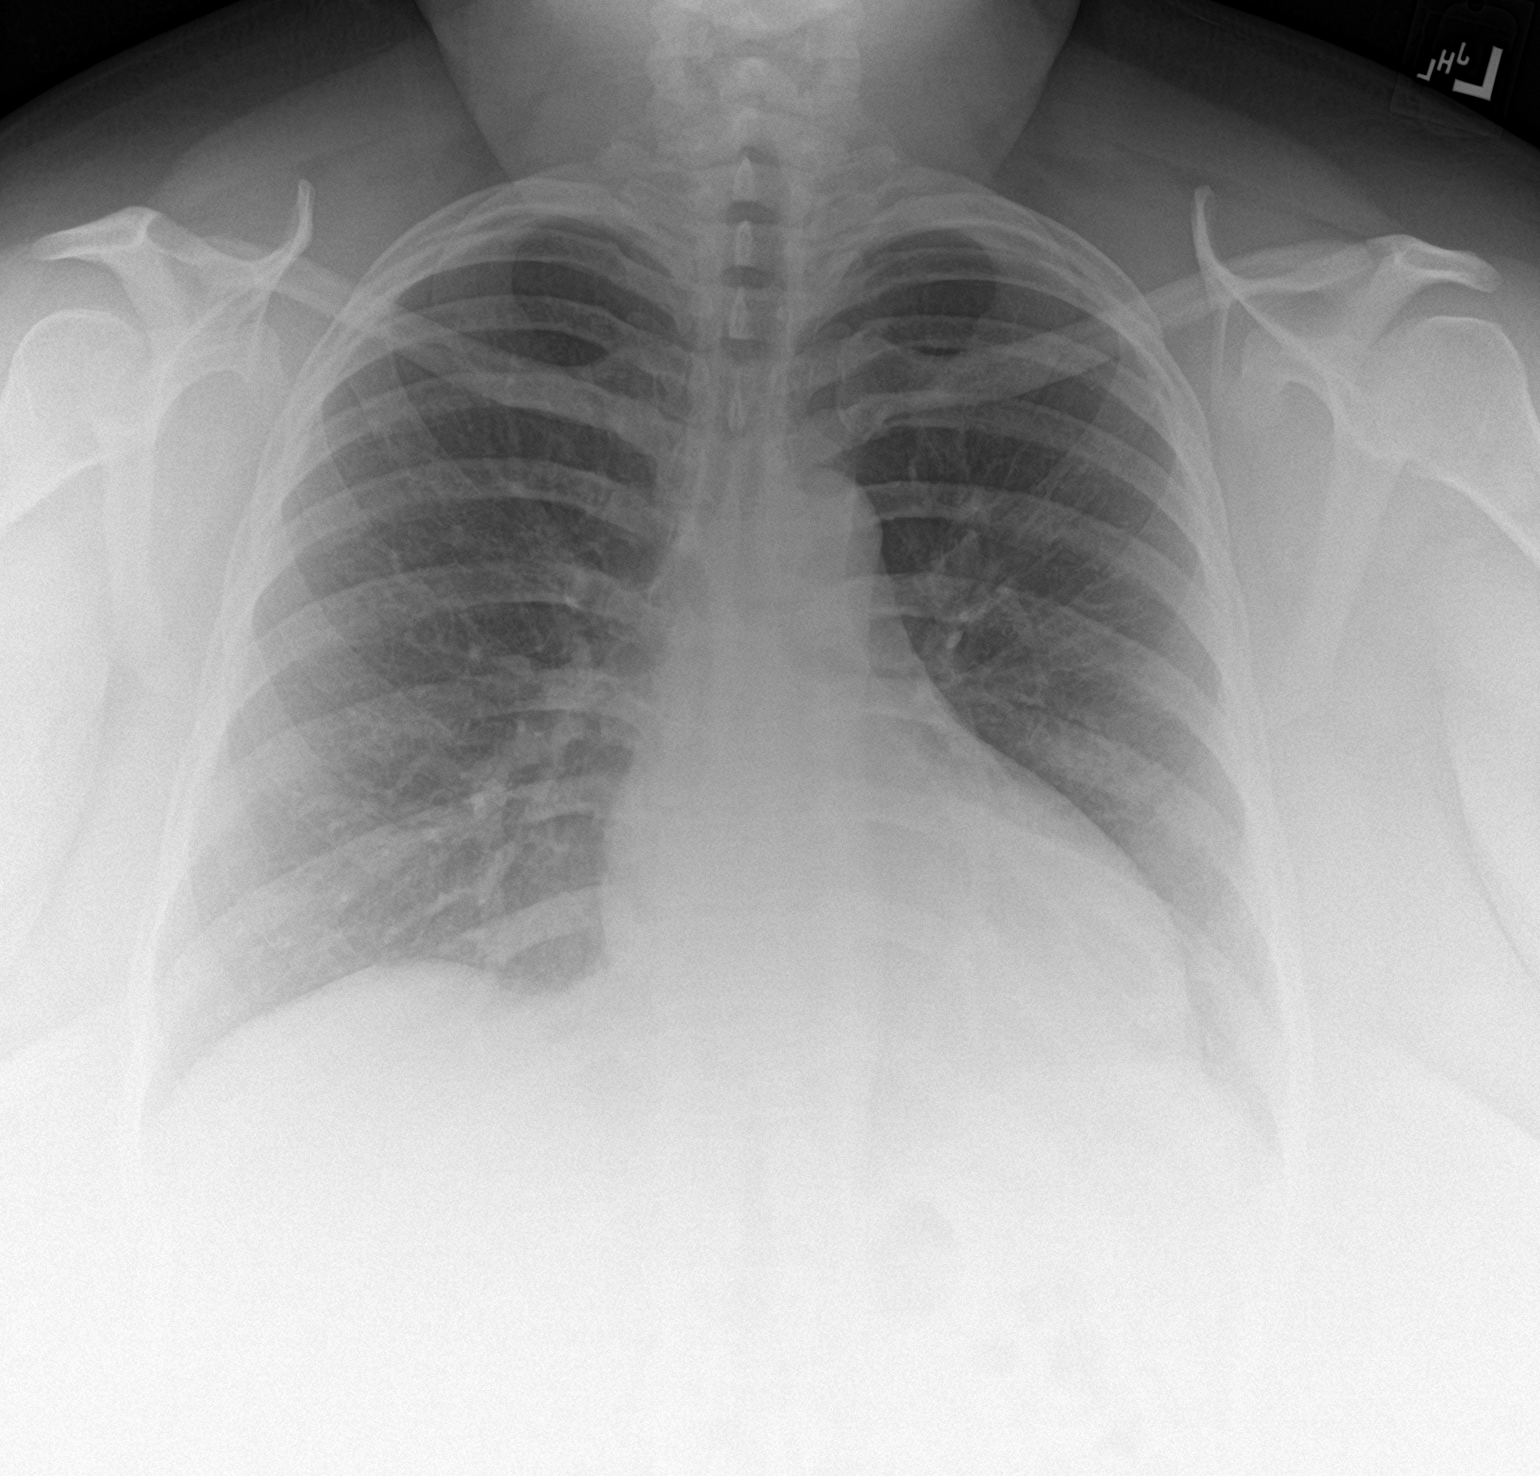

[chest lat]
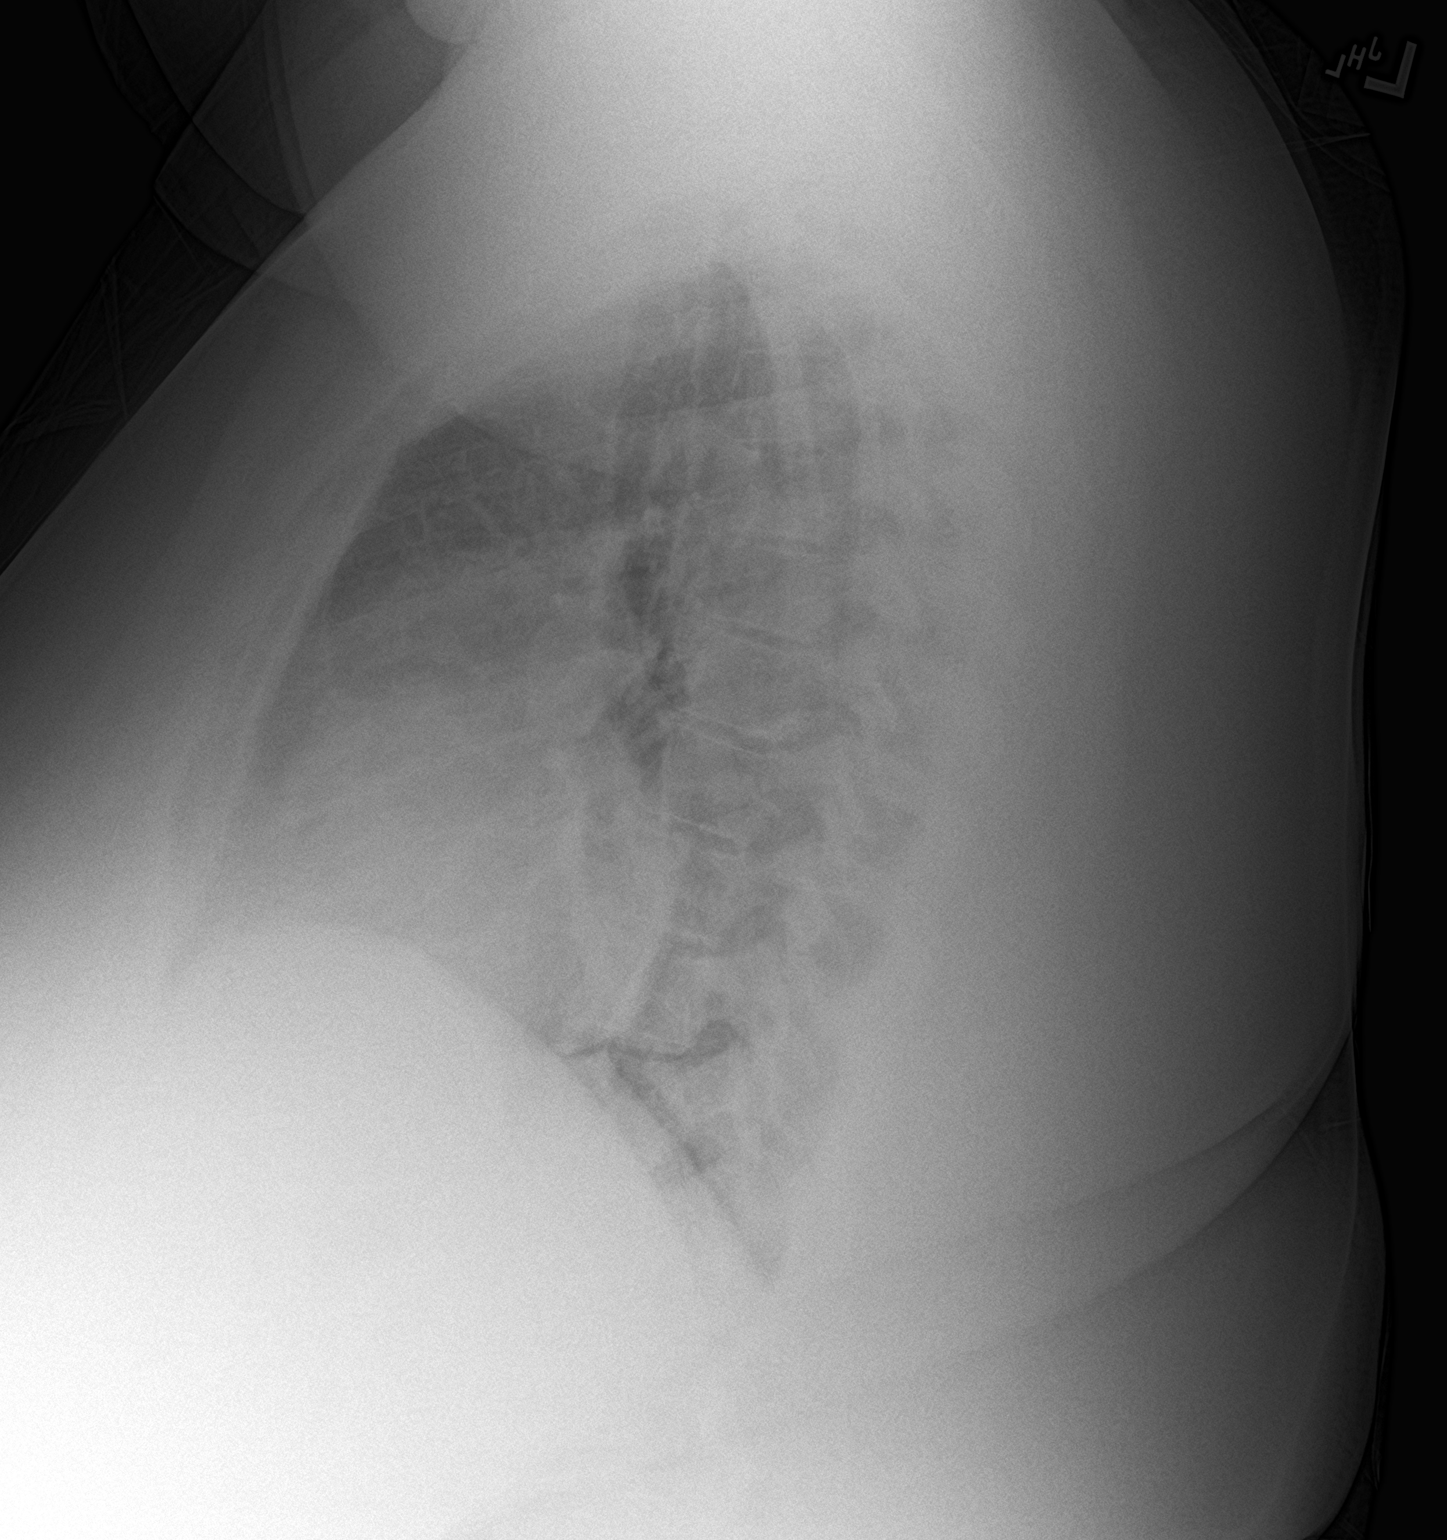

[2 of 2 positions shown; findings below may reference images not displayed]

FINDINGS: The heart size and mediastinal contours are within normal limits.
Both lungs are clear. The visualized skeletal structures are
unremarkable.
IMPRESSION: No active cardiopulmonary disease.

## 2022-05-18 ENCOUNTER — Ambulatory Visit: Payer: BC Managed Care – PPO | Admitting: Obstetrics and Gynecology

## 2022-06-09 NOTE — Progress Notes (Signed)
41 y.o. [id number] Married [place] American female here for annual exam. Pt reports cycles/bleeding has started to change in the past couple months with pill. Denies missing or skipping a pill. Pt also reports some external vulvar itching since day 2 of cycle. Pt always reports typically only using pads for sanitization.  Some vaginal discharge.  Wants evaluation.   Had weight loss surgery in 2022.  Lost almost 100 pounds.  Does go to the weight loss clinic.  Has periodic bilateral breast pain.  Does not correlate with her menstrual cycle.  Menses are monthly.  LMP 05/13/22.  Then again 06/04/22 and lasted full week.   Sister with hx breast/ovarian cancer.   PCP: Deatra James, MD  Patient's last menstrual period was 06/04/2022 (exact date).     Period Cycle (Days):  (21-28) Period Duration (Days): 4-8 Menstrual Flow:  (varies) Menstrual Control: Maxi pad Dysmenorrhea: (!) Mild Dysmenorrhea Symptoms: Cramping     Sexually active: Yes.    The current method of family planning is oral progesterone-only contraceptive.    Exercising: Yes   Cardio 3x a week Smoker:  no  Health Maintenance: Pap: 07/18/2019-WNL, HPV- neg, 10/05/2016-WNL, HPV- neg History of abnormal Pap: no MMG: 06/17/2021- birads 2 benign, needs to schedule  Colonoscopy: never BMD: never  Result n/a TDaP: UTD per pt Gardasil: no HIV: neg in the past  Hep C: neg in the past Screening Labs:  PCP and weight loss clinic Flu vaccine:  declined.    reports that she has never smoked. She has never used smokeless tobacco. She reports current alcohol use. She reports that she does not use drugs.  Past Medical History:  Diagnosis Date   ADHD    Anxiety    Back pain    Depression    Family history of lung cancer    Family history of multiple myeloma    Pre-diabetes    STD (sexually transmitted disease)    Tx'd for chlamydia over 20 years ago    Past Surgical History:  Procedure Laterality Date   ASPIRATION BIOPSY      fluid around heart- negative results in 2000   BIOPSY  01/28/2021   Procedure: BIOPSY;  Surgeon: Quentin Ore, MD;  Location: [facility];  Service: General;;   ESOPHAGOGASTRODUODENOSCOPY N/A 01/28/2021   Procedure: ESOPHAGOGASTRODUODENOSCOPY (EGD);  Surgeon: Quentin Ore, MD;  Location: [facility];  Service: General;  Laterality: N/A;   fluid on heart  2000   chest tube placed   IUD removal under sedation     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     cyclobenzaprine (FLEXERIL) 5 MG tablet Take 5 mg by mouth at bedtime as needed.     Multiple Vitamin (MULTIVITAMIN PO) Take by mouth.     pantoprazole (PROTONIX) 40 MG tablet Take 40 mg by mouth daily.     Vitamin D, Ergocalciferol, (DRISDOL) 1.25 MG (50000 UNIT) CAPS capsule Take 1 capsule (50,000 Units total) by mouth every 7 (seven) days. 12 capsule 0   lisdexamfetamine (VYVANSE) 10 MG capsule Take 1 capsule (10 mg total) by mouth daily before breakfast. 30 capsule 0   norethindrone (MICRONOR) 0.35 MG tablet Take  1 tablet (0.35 mg total) by mouth daily. 84 tablet 3   No current facility-administered medications for this visit.    Family History  Problem Relation Age of Onset   Diabetes Mother    High blood pressure Mother    Depression Mother    Sleep apnea Mother    Obesity Mother    Hypertension Mother    Hyperlipidemia Mother    Thyroid disease Mother    Lung cancer Mother 34   High Cholesterol Father    Cancer Father        unknown type - leg amputated   Alcoholism Father    Ovarian cancer Half-Sister 67       Dec ovarian Cancer   Breast cancer Half-Sister 54   Diabetes Maternal Grandmother     Stroke Maternal Grandfather    Multiple myeloma Cousin 40       in remission (maternal first cousin)    Review of Systems  All other systems reviewed and are negative.   Exam:   BP 118/84   Pulse 82   Ht 5' 2.5" (1.588 m)   Wt 202 lb (91.6 kg)   LMP 06/04/2022 (Exact Date)   SpO2 99%   BMI 36.36 kg/m     General appearance: alert, cooperative and appears stated age Head: normocephalic, without obvious abnormality, atraumatic Neck: no adenopathy, supple, symmetrical, trachea midline and thyroid normal to inspection and palpation Lungs: clear to auscultation bilaterally Breasts: normal appearance, no masses or tenderness, No nipple retraction or dimpling, No nipple discharge or bleeding, No axillary adenopathy.  Left axillary sebaceous cyst 1 cm, nontender. Heart: regular rate and rhythm Abdomen: soft, non-tender; no masses, no organomegaly Extremities: extremities normal, atraumatic, no cyanosis or edema Skin: skin color, texture, turgor normal. No rashes or lesions Lymph nodes: cervical, supraclavicular, and axillary nodes normal. Neurologic: grossly normal  Pelvic: External genitalia:  no lesions              No abnormal inguinal nodes palpated.              Urethra:  normal appearing urethra with no masses, tenderness or lesions              Bartholins and Skenes: normal                 Vagina: normal appearing vagina with normal color and discharge, no lesions.  Frothy yellow discharge              Cervix: no lesions              Pap taken: no Bimanual Exam:  Uterus:  normal size, contour, position, consistency, mobility, non-tender              Adnexa: no mass, fullness, tenderness              Rectal exam: yes.  Confirms.              Anus:  normal sphincter tone, no lesions  Chaperone was present for exam:  Ladona Ridgel  Assessment:   Well woman visit with gynecologic exam. FH breast and ovarian cancer in sister.  Patient has had genetic counseling but not testing.  Patient's lifetime risk of breast cancer is 10.7%. Uterine fibroid.  Status post roux-en-y surgery.   Successful weight loss.  Left axillary mass, decreased in size.  Sebaceous cyst on [name].  Vulvovaginitis.  STD screening.   Plan: Mammogram screening discussed. Self breast awareness reviewed. Pap and  HR HPV as above. Guidelines for Calcium, Vitamin D, regular exercise program including cardiovascular and weight bearing exercise. Rx Micronor x 1 year. Wet prep:  positive for trichomonas and clue cells.  Flagyl 500 mg po bid x 7 days.  She will inform her partner to see his provider for treatment.  Nuswab for gonorrhea and chlamydia testing sent.  Serum STD screening ordered. Return for pelvic ultrasound to check ovaries and fibroid and repeat testing for trichomonas.  Pelvic ultrasound will need to be done with water only.  Follow up annually and prn.   After visit summary provided.

## 2022-06-12 ENCOUNTER — Other Ambulatory Visit (HOSPITAL_COMMUNITY)
Admission: RE | Admit: 2022-06-12 | Discharge: 2022-06-12 | Disposition: A | Discharge status: Home / Self Care | Payer: BC Managed Care – PPO | Source: Ambulatory Visit | Attending: Obstetrics and Gynecology | Admitting: Obstetrics and Gynecology

## 2022-06-12 ENCOUNTER — Ambulatory Visit (INDEPENDENT_AMBULATORY_CARE_PROVIDER_SITE_OTHER): Payer: BC Managed Care – PPO | Admitting: Obstetrics and Gynecology

## 2022-06-12 ENCOUNTER — Encounter: Payer: Self-pay | Admitting: Obstetrics and Gynecology

## 2022-06-12 VITALS — BP 118/84 | HR 82 | Ht 62.5 in | Wt 202.0 lb

## 2022-06-12 DIAGNOSIS — Z114 Encounter for screening for human immunodeficiency virus [HIV]: Secondary | ICD-10-CM

## 2022-06-12 DIAGNOSIS — Z1159 Encounter for screening for other viral diseases: Secondary | ICD-10-CM

## 2022-06-12 DIAGNOSIS — Z113 Encounter for screening for infections with a predominantly sexual mode of transmission: Secondary | ICD-10-CM | POA: Diagnosis present

## 2022-06-12 DIAGNOSIS — D219 Benign neoplasm of connective and other soft tissue, unspecified: Secondary | ICD-10-CM

## 2022-06-12 DIAGNOSIS — A599 Trichomoniasis, unspecified: Secondary | ICD-10-CM

## 2022-06-12 DIAGNOSIS — N760 Acute vaginitis: Secondary | ICD-10-CM

## 2022-06-12 DIAGNOSIS — Z8041 Family history of malignant neoplasm of ovary: Secondary | ICD-10-CM

## 2022-06-12 DIAGNOSIS — Z01419 Encounter for gynecological examination (general) (routine) without abnormal findings: Secondary | ICD-10-CM

## 2022-06-12 HISTORY — DX: Trichomoniasis, unspecified: A59.9

## 2022-06-12 LAB — WET PREP FOR TRICH, YEAST, CLUE

## 2022-06-12 MED ORDER — NORETHINDRONE 0.35 MG PO TABS: 1.0000 | ORAL_TABLET | Freq: Every day | ORAL | 3 refills | Status: DC

## 2022-06-12 MED ORDER — METRONIDAZOLE 500 MG PO TABS: 500.0000 mg | ORAL_TABLET | Freq: Two times a day (BID) | ORAL | 0 refills | Status: DC

## 2022-06-12 NOTE — Patient Instructions (Signed)

## 2022-06-14 ENCOUNTER — Encounter: Payer: Self-pay | Admitting: Obstetrics and Gynecology

## 2022-06-16 LAB — CERVICOVAGINAL ANCILLARY ONLY
Chlamydia: NEGATIVE
Comment: NEGATIVE
Comment: NORMAL
Neisseria Gonorrhea: NEGATIVE

## 2022-06-17 ENCOUNTER — Encounter: Payer: Self-pay | Admitting: Obstetrics and Gynecology

## 2022-06-18 ENCOUNTER — Other Ambulatory Visit: Payer: BC Managed Care – PPO

## 2022-06-18 DIAGNOSIS — Z114 Encounter for screening for human immunodeficiency virus [HIV]: Secondary | ICD-10-CM

## 2022-06-18 DIAGNOSIS — Z113 Encounter for screening for infections with a predominantly sexual mode of transmission: Secondary | ICD-10-CM

## 2022-06-18 DIAGNOSIS — Z1159 Encounter for screening for other viral diseases: Secondary | ICD-10-CM

## 2022-06-19 ENCOUNTER — Telehealth: Payer: Self-pay | Admitting: *Deleted

## 2022-06-19 LAB — RPR: RPR Ser Ql: NONREACTIVE

## 2022-06-19 LAB — HEPATITIS C ANTIBODY: Hepatitis C Ab: NONREACTIVE

## 2022-06-19 LAB — HIV ANTIBODY (ROUTINE TESTING W REFLEX): HIV 1&2 Ab, 4th Generation: NONREACTIVE

## 2022-06-19 NOTE — Telephone Encounter (Signed)
We can do the test for trichomonas and the pelvic ultrasound on the same day as long as no gel is used for the pelvic ultrasound.   Please make a note in the chart that water only is to be used for the ultrasound, and inform the patient as well.

## 2022-06-19 NOTE — Telephone Encounter (Signed)
Patient informed with below, water note already on ultrasound appointment tab.

## 2022-06-19 NOTE — Telephone Encounter (Signed)
Patient is scheduled for pelvic ultrasound on 07/30/22, she asked if the "retesting/test of cure for trichomonas" can be done at the time of ultrasound? If it can't she is off that whole day off, it she needs to see you early to have retesting/test of cure for trichomonas she can.  Please advise

## 2022-06-19 NOTE — Telephone Encounter (Signed)
-----   Message from Nunzio Cobbs, MD sent at 06/17/2022  6:44 AM EST ----- Please inform patient that her testing is negative for gonorrhea and chlamydia.  She does need a follow up visit with me in mid to end of January retesting/test of cure for trichomonas.  She was to return to do blood testing to complete STD screening.   She can do this at the same time as her follow up visit if she would like.

## 2022-07-02 ENCOUNTER — Other Ambulatory Visit: Payer: Self-pay | Admitting: Obstetrics and Gynecology

## 2022-07-02 DIAGNOSIS — Z1231 Encounter for screening mammogram for malignant neoplasm of breast: Secondary | ICD-10-CM

## 2022-07-21 NOTE — Progress Notes (Signed)
GYNECOLOGY  VISIT   HPI: 41 y.o.   Married  [place] American  female   [phone] with Patient's last menstrual period was 07/10/2022.   here for   U/S consult  Family history of ovarian cancer.  Hx uterine fibroids. Two menstrual periods in November.   LMP 07/10/22.   Recent trichomonas, dx in December.   Completed course of Flagyl. Husband tested negative but was treated also.  Patient needs test of cure.  GYNECOLOGIC HISTORY: Patient's last menstrual period was 07/10/2022. 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   Morbid obesity with BMI of 50.0-59.9, adult ([place]) 03/04/2021   Difficult intravenous access 01/28/2021   Orthopnea 12/17/2020   Snoring 12/17/2020   Binge eating disorder 12/27/7626   Metabolic syndrome 31/51/7616   Depression 08/13/2020   Family history of lung cancer    Family history of multiple myeloma    Vitamin D deficiency 05/29/2020   Family history of ovarian cancer 05/06/2020   Family history of breast cancer 05/06/2020   Class 3 severe obesity with serious comorbidity and body mass index (BMI) of 50.0 to 59.9 in adult [facility]) 05/25/2019   Prediabetes 03/20/2019   HTN (hypertension), benign 02/13/2019    Past Medical History:  Diagnosis Date   ADHD    Anxiety    Back pain    Depression    Family history of lung cancer    Family history of multiple myeloma    HTN (hypertension), benign 02/13/2019   Pre-diabetes    STD (sexually transmitted disease)     Tx'd for chlamydia over 20 years ago   Trichomonas infection 06/12/2022    Past Surgical History:  Procedure Laterality Date   ASPIRATION BIOPSY     fluid around heart- negative results in 2000   BIOPSY  01/28/2021   Procedure: BIOPSY;  Surgeon: Felicie Morn, MD;  [facility];  Service: General;;   ESOPHAGOGASTRODUODENOSCOPY N/A 01/28/2021   Procedure: ESOPHAGOGASTRODUODENOSCOPY (EGD);  Surgeon: Felicie Morn, MD;  [facility]: [facility];  Service: General;  Laterality: N/A;   fluid on heart  2000   chest tube placed   IUD removal under sedation     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     cyclobenzaprine (FLEXERIL) 5 MG tablet Take 5 mg by mouth at bedtime as needed.     Multiple Vitamin (  MULTIVITAMIN PO) Take by mouth.     norethindrone (MICRONOR) 0.35 MG tablet Take 1 tablet (0.35 mg total) by mouth daily. 84 tablet 3   pantoprazole (PROTONIX) 40 MG tablet Take 40 mg by mouth daily.     Vitamin D, Ergocalciferol, (DRISDOL) 1.25 MG (50000 UNIT) CAPS capsule Take 1 capsule (50,000 Units total) by mouth every 7 (seven) days. 12 capsule 0   metroNIDAZOLE (FLAGYL) 500 MG tablet Take 1 tablet (500 mg total) by mouth 2 (two) times daily. (Patient not taking: Reported on 07/30/2022) 14 tablet 0   No current facility-administered medications for this visit.     ALLERGIES: Patient has no known allergies.  Family History  Problem Relation Age of Onset   Diabetes Mother    High blood pressure Mother    Depression Mother    Sleep apnea Mother    Obesity Mother    Hypertension Mother    Hyperlipidemia Mother    Thyroid disease Mother    Lung cancer Mother  54   High Cholesterol Father    Cancer Father        unknown type - leg amputated   Alcoholism Father    Ovarian cancer Half-Sister 10       Dec ovarian Cancer   Breast cancer Half-Sister 73   Diabetes Maternal Grandmother    Stroke Maternal Grandfather    Multiple myeloma Cousin 40       in remission (maternal first cousin)    Social History   Socioeconomic History   Marital status: Married    Spouse name: Marjory Lies   Number of children: Not on file   Years of education: Not on file   Highest education level: Not on file  Occupational History   Occupation: Education officer, museum  Tobacco Use   Smoking status: Never   Smokeless tobacco: Never  Vaping Use   Vaping Use: Never used  Substance and Sexual Activity   Alcohol use: Yes    Comment: socially   Drug use: No   Sexual activity: Yes    Partners: Male    Birth control/protection: Pill  Other Topics Concern   Not on file  Social History Narrative   Not on file   Social Determinants of Health   Financial Resource Strain: Not on file  Food Insecurity: Not on file  Transportation Needs: Not on file  Physical Activity: Not on file  Stress: Not on file  Social Connections: Not on file  Intimate Partner Violence: Not on file    Review of Systems  See PHI.   PHYSICAL EXAMINATION:    BP 120/80 (BP Location: Left Arm, Patient Position: Sitting, Cuff Size: Normal)   Pulse 77   Ht 5' 2.5" (1.588 m)   Wt 202 lb (91.6 kg)   LMP 07/10/2022   SpO2 99%   BMI 36.36 kg/m     General appearance: alert, cooperative and appears stated age   Pelvic US Uterus 7.19 x 4.92 x 3.92 cm.  2 fibroids:  1.11 cm, 0.82 cm.  EMS 2.47 mm.  Left ovary 2.37 x 1.73 x 1.45 cm.  2 simple follicles:  11 x 7 mm and 20 x 9 mm. Right ovary 2.14 x 1.50 x 0.93 cm.  No adnexal masses.  No free fluid.  ASSESSMENT  Small uterine fibroids.  Family history of ovarian cancer.  Normal ovaries.  Recent trichomonas infection, treated.  PLAN  Pelvic  US report and images reviewed.  Reassurance given.  Fu for trichomonas  testing in about 1 week.  Unable to do testing today due to the use of gel in the vagina.   An After Visit Summary was printed and given to the patient.  26 min  total time was spent for this patient encounter, including preparation, face-to-face counseling with the patient, coordination of care, and documentation of the encounter.

## 2022-07-30 ENCOUNTER — Ambulatory Visit (INDEPENDENT_AMBULATORY_CARE_PROVIDER_SITE_OTHER): Payer: BC Managed Care – PPO | Admitting: Obstetrics and Gynecology

## 2022-07-30 ENCOUNTER — Encounter: Payer: Self-pay | Admitting: Obstetrics and Gynecology

## 2022-07-30 ENCOUNTER — Ambulatory Visit (INDEPENDENT_AMBULATORY_CARE_PROVIDER_SITE_OTHER): Payer: BC Managed Care – PPO

## 2022-07-30 VITALS — BP 120/80 | HR 77 | Ht 62.5 in | Wt 202.0 lb

## 2022-07-30 DIAGNOSIS — D219 Benign neoplasm of connective and other soft tissue, unspecified: Secondary | ICD-10-CM

## 2022-07-30 DIAGNOSIS — A599 Trichomoniasis, unspecified: Secondary | ICD-10-CM | POA: Diagnosis not present

## 2022-07-30 DIAGNOSIS — Z8041 Family history of malignant neoplasm of ovary: Secondary | ICD-10-CM

## 2022-07-30 DIAGNOSIS — D259 Leiomyoma of uterus, unspecified: Secondary | ICD-10-CM

## 2022-08-11 ENCOUNTER — Ambulatory Visit: Payer: BC Managed Care – PPO | Admitting: Obstetrics and Gynecology

## 2022-08-11 ENCOUNTER — Other Ambulatory Visit (HOSPITAL_COMMUNITY)
Admission: RE | Admit: 2022-08-11 | Discharge: 2022-08-11 | Disposition: A | Discharge status: Home / Self Care | Payer: BC Managed Care – PPO | Source: Ambulatory Visit | Attending: Obstetrics and Gynecology | Admitting: Obstetrics and Gynecology

## 2022-08-11 ENCOUNTER — Encounter: Payer: Self-pay | Admitting: Obstetrics and Gynecology

## 2022-08-11 VITALS — BP 122/70

## 2022-08-11 DIAGNOSIS — A599 Trichomoniasis, unspecified: Secondary | ICD-10-CM | POA: Diagnosis present

## 2022-08-11 NOTE — Progress Notes (Signed)
GYNECOLOGY  VISIT   HPI: 42 y.o.   Married  [place] American  female   [phone] with Patient's last menstrual period was 07/10/2022.   here for TOC--Trichomonas positive 06/2022. Patient and partner were treated with Flagyl.   No concerns today.  Going to [place] and [place].   GYNECOLOGIC HISTORY: Patient's last menstrual period was 07/10/2022. Contraception:  ocp Menopausal hormone therapy:  na Last mammogram:  scheduled 08/24/22 Last pap smear:   07/18/19  WNL, HR HPV negative     OB History     Gravida  3   Para  1   Term      Preterm      AB  2   Living  1      SAB      IAB  2   Ectopic      Multiple      Live Births                 Patient Active Problem List   Diagnosis Date Noted   Anastomotic edema of gastrojejunostomy 03/08/2021   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/08/2021   Morbid obesity with BMI of 50.0-59.9, adult ([name]) 03/04/2021   Difficult intravenous access 01/28/2021   Orthopnea 12/17/2020   Snoring 12/17/2020   Binge eating disorder 98/33/8250   Metabolic syndrome 53/97/6734   Depression 08/13/2020   Family history of lung cancer    Family history of multiple myeloma    Vitamin D deficiency 05/29/2020   Family history of ovarian cancer 05/06/2020   Family history of breast cancer 05/06/2020   Class 3 severe obesity with serious comorbidity and body mass index (BMI) of 50.0 to 59.9 in adult [facility]) 05/25/2019   Prediabetes 03/20/2019   HTN (hypertension), benign 02/13/2019    Past Medical History:  Diagnosis Date   ADHD    Anxiety    Back pain    Depression    Family history of lung cancer    Family history of multiple myeloma    HTN (hypertension), benign 02/13/2019   Pre-diabetes    STD (sexually transmitted disease)    Tx'd for chlamydia over 20 years ago   Trichomonas infection 06/12/2022    Past Surgical History:  Procedure Laterality Date   ASPIRATION BIOPSY     fluid around heart- negative results in  2000   BIOPSY  01/28/2021   Procedure: BIOPSY;  Surgeon: Felicie Morn, MD;  [facility];  Service: General;;   ESOPHAGOGASTRODUODENOSCOPY N/A 01/28/2021   Procedure: ESOPHAGOGASTRODUODENOSCOPY (EGD);  Surgeon: Felicie Morn, MD;  [facility];  Service: General;  Laterality: N/A;   fluid on heart  2000   chest tube placed   IUD removal under sedation     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   [facility] N/A 03/04/2021   Procedure: UPPER GI ENDOSCOPY;  Surgeon: Felicie Morn, MD;  [facility]: [facility];  Service: General;  Laterality: N/A;    Current Outpatient Medications  Medication Sig Dispense Refill   ASHWAGANDHA PO Take 2 tablets by mouth every evening.     cyclobenzaprine (FLEXERIL) 5 MG tablet Take 5 mg by mouth at bedtime as needed.     Multiple Vitamin (MULTIVITAMIN PO) Take by mouth.     norethindrone (MICRONOR) 0.35 MG tablet Take 1 tablet (0.35 mg total) by mouth daily. 84 tablet 3   pantoprazole (PROTONIX) 40 MG tablet Take 40 mg by mouth daily.  Vitamin D, Ergocalciferol, (DRISDOL) 1.25 MG (50000 UNIT) CAPS capsule Take 1 capsule (50,000 Units total) by mouth every 7 (seven) days. 12 capsule 0   metroNIDAZOLE (FLAGYL) 500 MG tablet Take 1 tablet (500 mg total) by mouth 2 (two) times daily. (Patient not taking: Reported on 07/30/2022) 14 tablet 0   No current facility-administered medications for this visit.     ALLERGIES: Patient has no known allergies.  Family History  Problem Relation Age of Onset   Diabetes Mother    High blood pressure Mother    Depression Mother    Sleep apnea Mother    Obesity Mother    Hypertension Mother    Hyperlipidemia Mother    Thyroid disease Mother    Lung cancer Mother 43   High Cholesterol Father    Cancer Father        unknown type - leg amputated   Alcoholism Father    Ovarian cancer Half-Sister 97       Dec ovarian Cancer   Breast cancer Half-Sister 53    Diabetes Maternal Grandmother    Stroke Maternal Grandfather    Multiple myeloma Cousin 76       in remission (maternal first cousin)    Social History   Socioeconomic History   Marital status: Married    Spouse name: Marjory Lies   Number of children: Not on file   Years of education: Not on file   Highest education level: Not on file  Occupational History   Occupation: Education officer, museum  Tobacco Use   Smoking status: Never   Smokeless tobacco: Never  Vaping Use   Vaping Use: Never used  Substance and Sexual Activity   Alcohol use: Yes    Comment: socially   Drug use: No   Sexual activity: Yes    Partners: Male    Birth control/protection: Pill  Other Topics Concern   Not on file  Social History Narrative   Not on file   Social Determinants of Health   Financial Resource Strain: Not on file  Food Insecurity: Not on file  Transportation Needs: Not on file  Physical Activity: Not on file  Stress: Not on file  Social Connections: Not on file  Intimate Partner Violence: Not on file    Review of Systems  All other systems reviewed and are negative.   PHYSICAL EXAMINATION:    BP 122/70 (BP Location: Left Arm, Patient Position: Sitting, Cuff Size: Large)   LMP 07/10/2022     General appearance: alert, cooperative and appears stated age   Pelvic: External genitalia:  no lesions              Urethra:  normal appearing urethra with no masses, tenderness or lesions              Bartholins and Skenes: normal                 Vagina: normal appearing vagina with normal color and whitish, yellow discharge, no lesions              Cervix: no lesions                Bimanual Exam:  Uterus:  normal size, contour, position, consistency, mobility, non-tender              Adnexa: no mass, fullness, tenderness        Chaperone was present for exam:  Santiago Glad  ASSESSMENT  Trichomonas infection, treated.  PLAN  Nuswab for  trichomonas testing.  FU prn.    An After Visit Summary  was printed and given to the patient.

## 2022-08-12 LAB — CERVICOVAGINAL ANCILLARY ONLY
Comment: NEGATIVE
Trichomonas: NEGATIVE

## 2022-08-24 ENCOUNTER — Ambulatory Visit
Admission: RE | Admit: 2022-08-24 | Discharge: 2022-08-24 | Disposition: A | Discharge status: Home / Self Care | Payer: BC Managed Care – PPO | Source: Ambulatory Visit | Attending: Obstetrics and Gynecology | Admitting: Obstetrics and Gynecology

## 2022-08-24 DIAGNOSIS — Z1231 Encounter for screening mammogram for malignant neoplasm of breast: Secondary | ICD-10-CM

## 2022-10-08 ENCOUNTER — Encounter (HOSPITAL_COMMUNITY): Payer: Self-pay | Admitting: *Deleted

## 2022-11-13 IMAGING — MG DIGITAL DIAGNOSTIC BILAT W/ TOMO W/ CAD
8 of 21 series · 8 of 40 positions shown · non-contrast
Comparison: Previous exam(s).

ACR Breast Density Category a: The breast tissue is almost entirely
fatty.

CLINICAL DATA: 40-year-old female presenting for evaluation of a
palpable left axillary lump. The patient states that she has had
these types of lumps over the course of her life. They fluctuate in
size, sometimes going away entirely. This lump also fluctuates and
sometimes she can express thick white material from it.

EXAM:
DIGITAL DIAGNOSTIC BILATERAL MAMMOGRAM WITH TOMOSYNTHESIS AND CAD
TECHNIQUE: Bilateral digital diagnostic mammography and breast tomosynthesis
was performed. The images were evaluated with computer-aided
detection.

[L TAN synth-2D (1 of 2)]
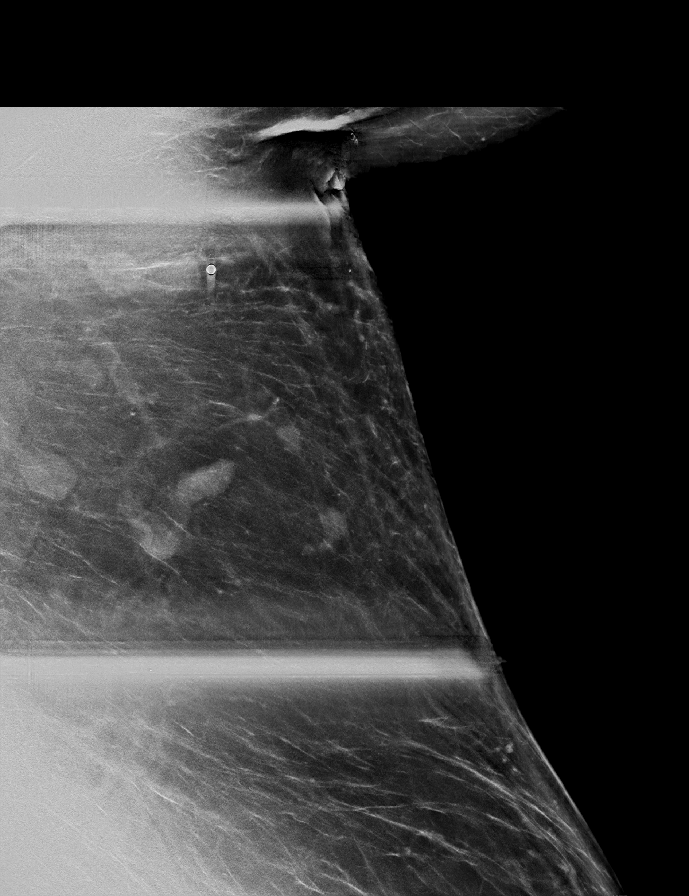

[L MLO synth-2D (1 of 3)]
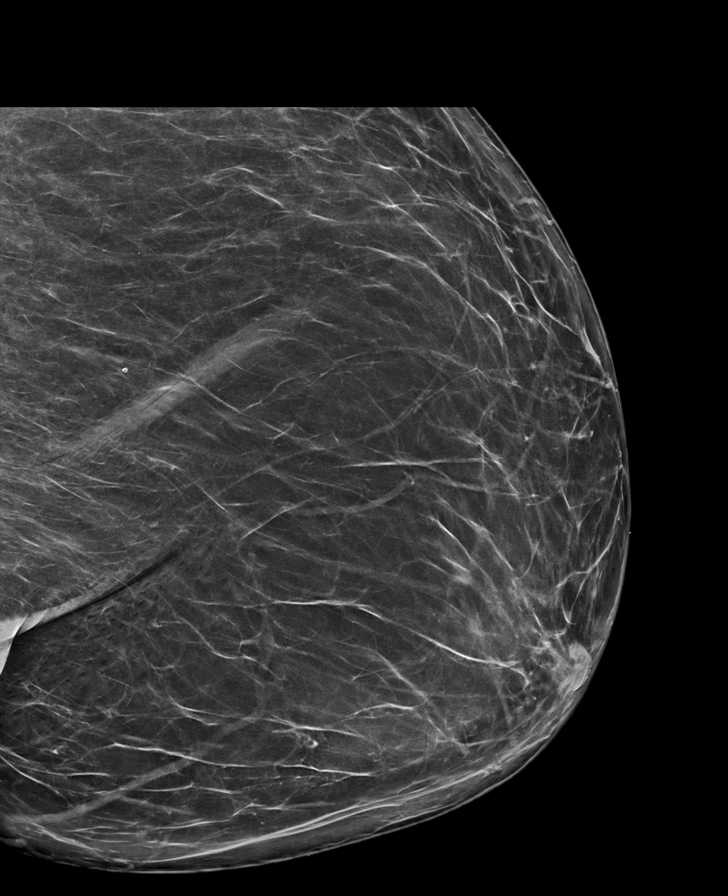

[L MLO synth-2D (2 of 3)]
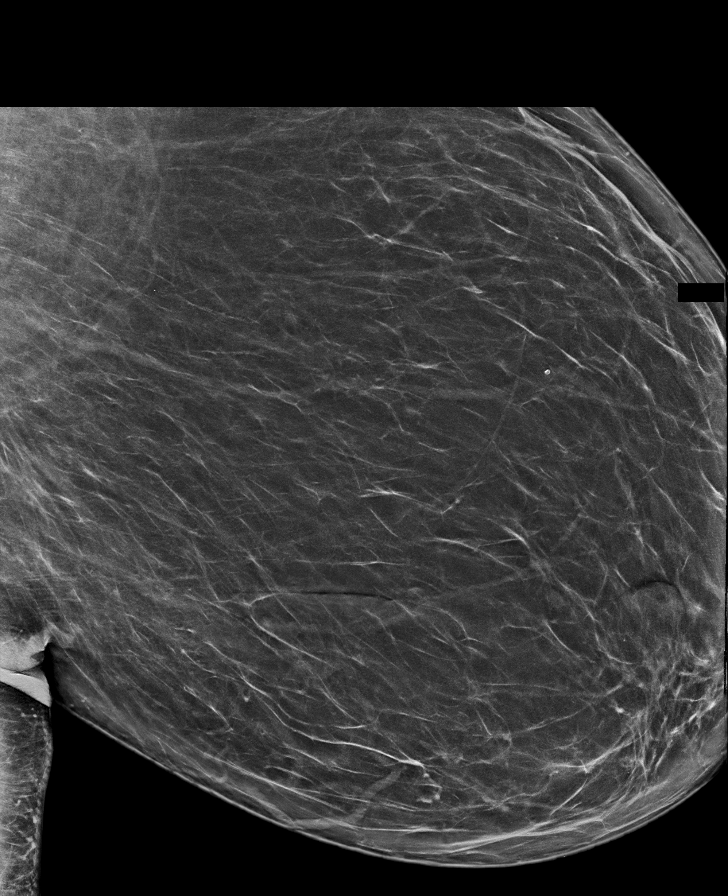

[L CC synth-2D]
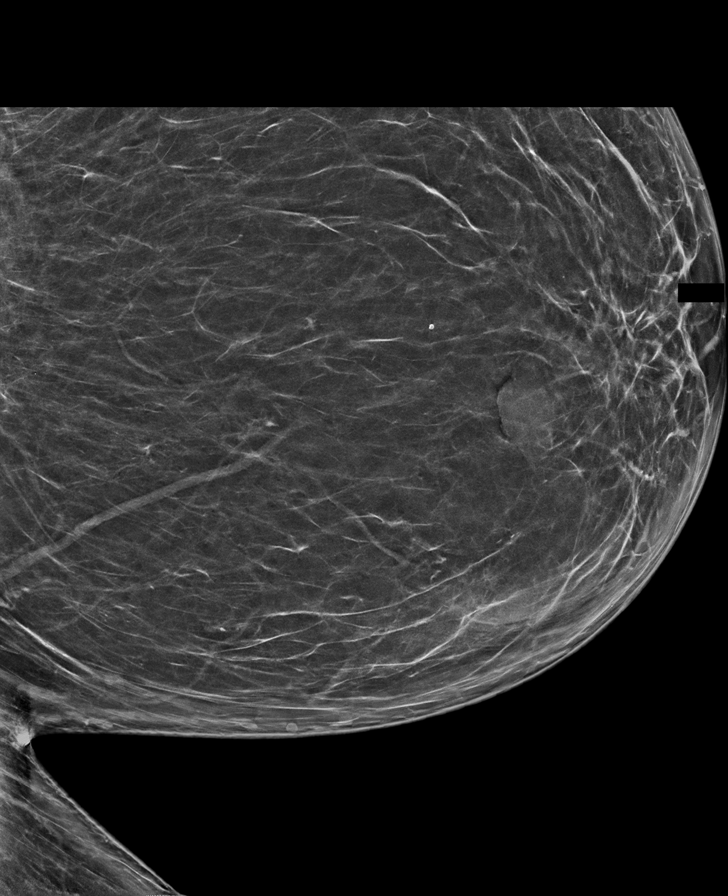

[R MLO synth-2D]
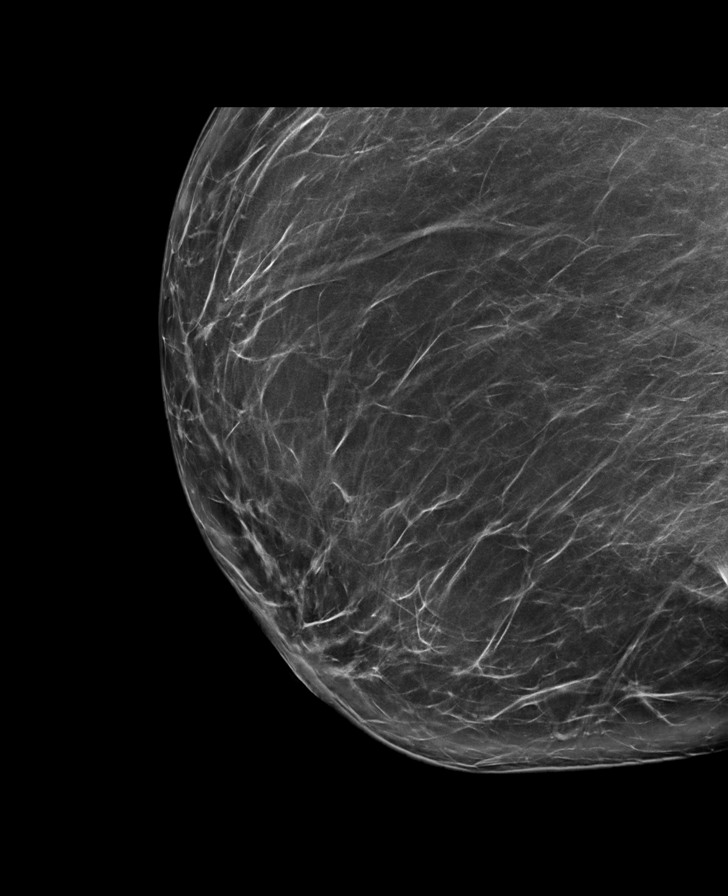

[L MLO synth-2D (3 of 3)]
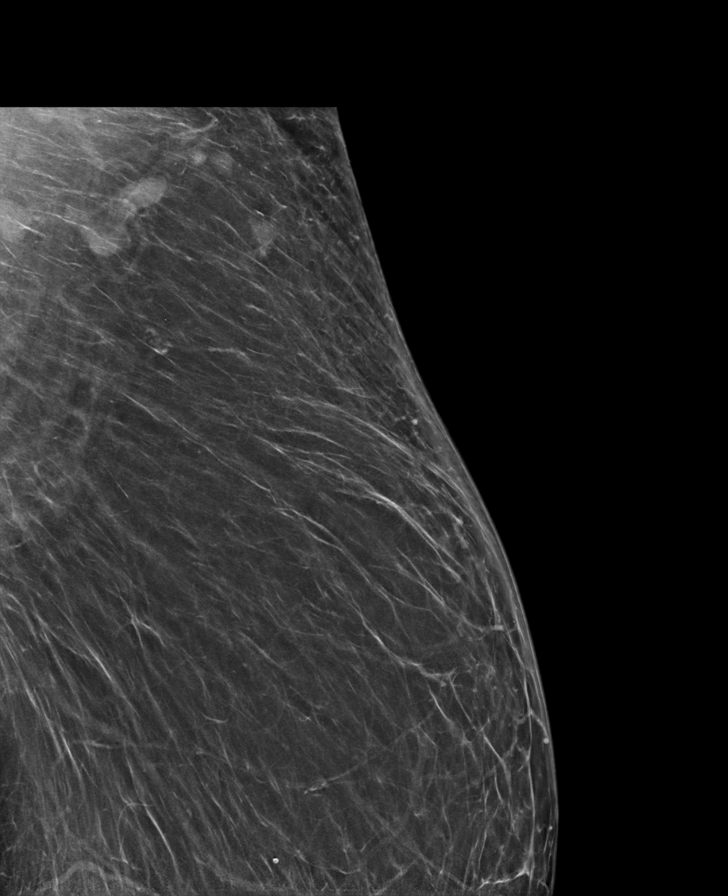

[L TAN synth-2D (2 of 2)]
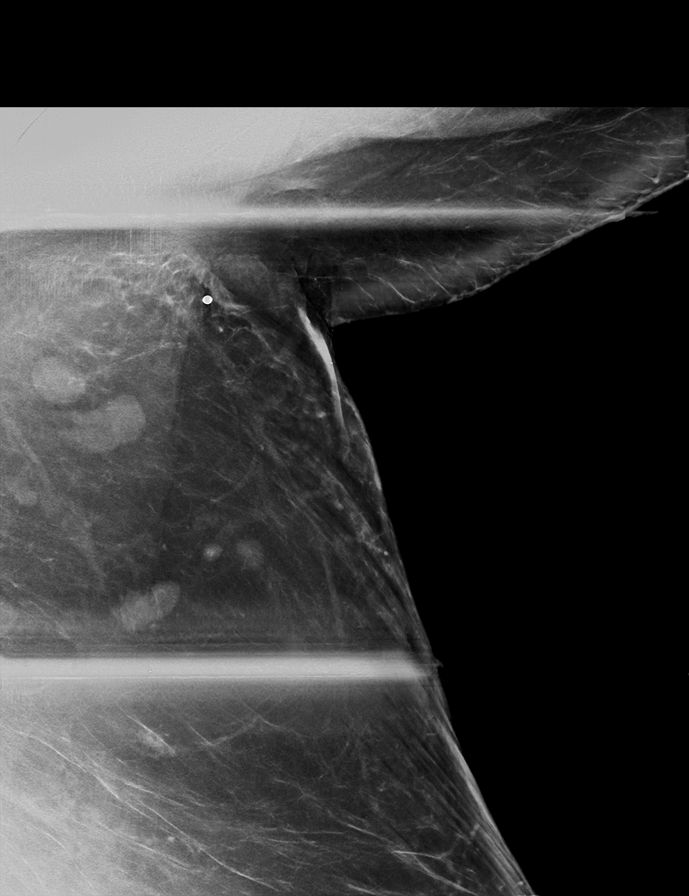

[L CC tomo · tomo slice 55/80.0]
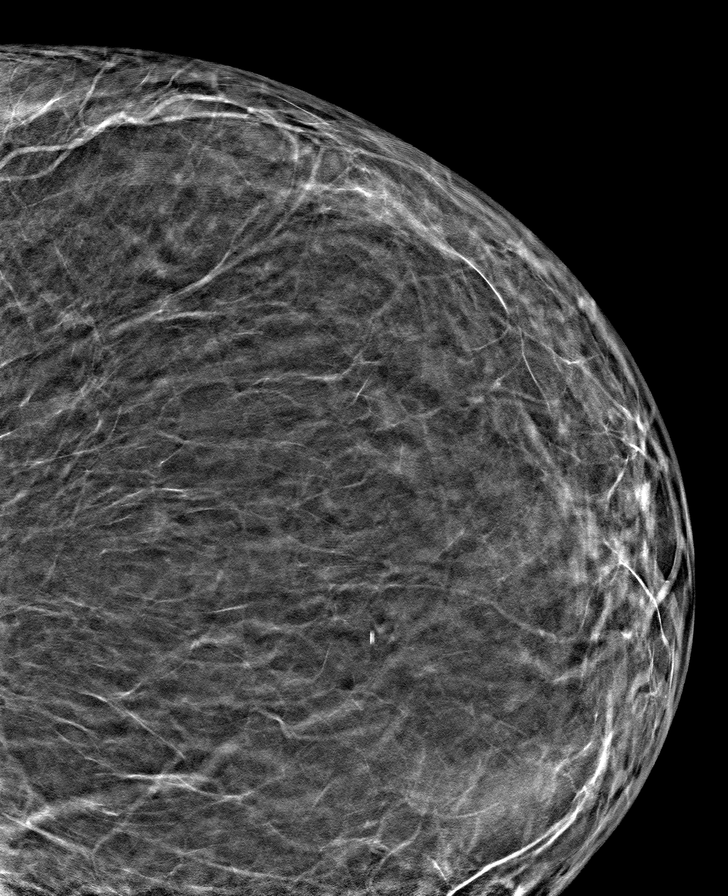

[8 of 40 positions shown; findings below may reference images not displayed]

FINDINGS: No suspicious calcifications, masses or areas of distortion are seen
in the bilateral breasts. Superficial inflammatory changes can be
seen at the site of the palpable marker over the left axilla. No
clear mass is identified.

Physical exam of the palpable site in the left axilla demonstrates a
protruding lump. There is a central dark punctum. This is compatible
with a sebaceous cyst.
IMPRESSION: 1. The palpable lump in the left axilla corresponds with a benign
sebaceous cyst.

2.  No mammographic evidence of malignancy in the bilateral breasts.

RECOMMENDATION:
Screening mammogram in one year.(Code:S8-Z-7QY)

I have discussed the findings and recommendations with the patient.
If applicable, a reminder letter will be sent to the patient
regarding the next appointment.

BI-RADS CATEGORY  2: Benign.

## 2022-11-19 ENCOUNTER — Ambulatory Visit: Payer: BC Managed Care – PPO | Admitting: Plastic Surgery

## 2022-11-19 ENCOUNTER — Encounter: Payer: Self-pay | Admitting: Plastic Surgery

## 2022-11-19 VITALS — BP 134/83 | HR 66 | Ht 62.5 in | Wt 192.2 lb

## 2022-11-19 DIAGNOSIS — L987 Excessive and redundant skin and subcutaneous tissue: Secondary | ICD-10-CM

## 2022-11-19 DIAGNOSIS — N62 Hypertrophy of breast: Secondary | ICD-10-CM | POA: Diagnosis not present

## 2022-11-19 DIAGNOSIS — Z803 Family history of malignant neoplasm of breast: Secondary | ICD-10-CM

## 2022-11-19 DIAGNOSIS — R21 Rash and other nonspecific skin eruption: Secondary | ICD-10-CM

## 2022-11-19 DIAGNOSIS — M793 Panniculitis, unspecified: Secondary | ICD-10-CM

## 2022-11-19 DIAGNOSIS — M549 Dorsalgia, unspecified: Secondary | ICD-10-CM

## 2022-11-19 DIAGNOSIS — M542 Cervicalgia: Secondary | ICD-10-CM

## 2022-11-19 DIAGNOSIS — Z9884 Bariatric surgery status: Secondary | ICD-10-CM

## 2022-11-19 NOTE — Progress Notes (Signed)
Referring Provider Deatra James, MD [phone] [address] [place],  [place] [address]   CC:  Chief Complaint  Patient presents with   Consult      Vanessa Wilcox is an 42 y.o. female.  HPI: Vanessa Wilcox is a 42 year old female who presents today for consultation for a breast reduction and for panniculectomy.  The patient underwent gastric bypass surgery in 2022 and is lost approximately 100 pounds.  With that weight loss she has had significant increase in skin around the abdominal region and relates multiple infections and skin rashes on the posterior aspect of the pannus and in the intertriginous regions.  She also notes that she is having extreme difficulty finding a bra that fits.  In addition to not finding bras with an adequate fit the ones that she does wear because of her back and neck pain and the bra straps digging into her shoulders.  The length of her breast also interfere with her daily activities with physical fitness routines.  No Known Allergies  Outpatient Encounter Medications as of 11/19/2022  Medication Sig   ASHWAGANDHA PO Take 2 tablets by mouth every evening.   cyclobenzaprine (FLEXERIL) 5 MG tablet Take 5 mg by mouth at bedtime as needed.   Multiple Vitamin (MULTIVITAMIN PO) Take by mouth.   norethindrone (MICRONOR) 0.35 MG tablet Take 1 tablet (0.35 mg total) by mouth daily.   pantoprazole (PROTONIX) 40 MG tablet Take 40 mg by mouth daily.   Vitamin D, Ergocalciferol, (DRISDOL) 1.25 MG (50000 UNIT) CAPS capsule Take 1 capsule (50,000 Units total) by mouth every 7 (seven) days.   [DISCONTINUED] metroNIDAZOLE (FLAGYL) 500 MG tablet Take 1 tablet (500 mg total) by mouth 2 (two) times daily. (Patient not taking: Reported on 07/30/2022)   No facility-administered encounter medications on file as of 11/19/2022.     Past Medical History:  Diagnosis Date   ADHD    Anxiety    Back pain    Depression    Family history of lung cancer    Family history of multiple  myeloma    HTN (hypertension), benign 02/13/2019   Pre-diabetes    STD (sexually transmitted disease)    Tx'd for chlamydia over 20 years ago   Trichomonas infection 06/12/2022    Past Surgical History:  Procedure Laterality Date   ASPIRATION BIOPSY     fluid around heart- negative results in 2000   BIOPSY  01/28/2021   Procedure: BIOPSY;  Surgeon: Quentin Ore, MD;  [facility];  Service: General;;   ESOPHAGOGASTRODUODENOSCOPY N/A 01/28/2021   Procedure: ESOPHAGOGASTRODUODENOSCOPY (EGD);  Surgeon: Quentin Ore, MD;  [facility];  Service: General;  Laterality: N/A;   fluid on heart  2000   chest tube placed   IUD removal under sedation     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   [facility]   UPPER GI ENDOSCOPY N/A 03/04/2021   Procedure: UPPER GI ENDOSCOPY;  Surgeon: Quentin Ore, MD;  [facility];  Service: General;  Laterality: N/A;    Family History  Problem Relation Age of Onset   Diabetes Mother    High blood pressure Mother    Depression Mother    Sleep apnea Mother    Obesity Mother    Hypertension Mother    Hyperlipidemia Mother    Thyroid disease Mother    Lung cancer Mother 6   High Cholesterol Father    Cancer Father  unknown type - leg amputated   Alcoholism Father    Ovarian cancer Half-Sister 26       Dec ovarian Cancer   Breast cancer Half-Sister 46   Diabetes Maternal Grandmother    Stroke Maternal Grandfather    Multiple myeloma Cousin 40       in remission (maternal first cousin)    Social History   Social History Narrative   Not on file     Review of Systems General: Denies fevers, chills, weight loss CV: Denies chest pain, shortness of breath, palpitations Breast: Long pendulous breasts with difficulty fitting bras and with upper back and neck pain Pannus: Excess skin around the lower portion of the abdomen which she frequently has rashes.  Physical Exam    11/19/2022     8:05 AM 08/11/2022    1:56 PM 07/30/2022   12:11 PM  Vitals with BMI  Height 5' 2.5"  5' 2.5"  Weight 192 lbs 3 oz  202 lbs  BMI 34.57  36.33  Systolic 134 122 161  Diastolic 83 70 80  Pulse 66  77    General:  No acute distress,  Alert and oriented, Non-Toxic, Normal speech and affect Breast: Patient has very large breasts that are primarily pendulous with grade 3 ptosis.  There are no dominant masses on physical exam and the nipples appear normal.  There is no nipple discharge today.  Her sternal notch to nipple distance is 43 cm on the right and 44 cm on the left her nipple to fold distance is 16 cm on the right and 19 cm on the left. Mammogram: Mammogram performed February 2024 was BI-RADS 1 t  Assessment/Plan Macromastia: In addition to having moderately heavy breasts the patient has extreme ptosis and excess skin due to her weight loss.  I think that she would be a good candidate for a breast reduction and I could remove between 450 g and 500 g per breast.  Breast reduction will be made more difficult due to the length of her breast and the excess skin.  We discussed this at length.  She understands that she is at a much higher risk for loss of the nipple due to loss of blood supply and the need for free nipple graft.  We discussed the procedure including the location of the incisions and the unpredictable nature of scarring.  This is particularly important in the spots that she already has a history of keloids she understands she is at a very high risk for developing keloids postoperatively.  We also discussed the risks of bleeding, infection, and seroma formation.  She understands I will use drains postoperatively.  The postoperative restrictions include no heavy lifting greater than 20 pounds, no vigorous activity, and no submerging the incisions in water for 6 weeks.  She may return to light activity as soon as she feels comfortable.  I have encouraged her to consider taking 1 week off from  work after the surgery.  All questions were answered to her satisfaction.  Photographs were obtained today with her consent.  Will submit for bilateral breast reduction Panniculitis: Patient has a moderately large pannus which hangs past her symphysis pubis.  She has not sought medical attention for rashes but treats them self with over-the-counter medications.  I have asked that due to the complexity of her breast reduction that she consider waiting for the panniculectomy until after she is healed from her breast reduction before we proceed she is happy to do  this.  We discussed the upper abdominal abdomen which I feel will give her some benefit though I do not feel strongly about encouraging her to have this done.  I will provide a quote for the upper abdomen panel at her request.  Photographs of the pannus were obtained today with her consent.  Santiago Glad 11/19/2022, 11:22 AM

## 2022-12-07 ENCOUNTER — Encounter: Payer: Self-pay | Admitting: *Deleted

## 2022-12-16 ENCOUNTER — Other Ambulatory Visit: Payer: Self-pay

## 2022-12-16 ENCOUNTER — Ambulatory Visit: Payer: BC Managed Care – PPO | Attending: Plastic Surgery | Admitting: Physical Therapy

## 2022-12-16 DIAGNOSIS — N62 Hypertrophy of breast: Secondary | ICD-10-CM | POA: Diagnosis not present

## 2022-12-16 DIAGNOSIS — M5459 Other low back pain: Secondary | ICD-10-CM | POA: Diagnosis present

## 2022-12-16 DIAGNOSIS — M6281 Muscle weakness (generalized): Secondary | ICD-10-CM | POA: Insufficient documentation

## 2022-12-16 DIAGNOSIS — M546 Pain in thoracic spine: Secondary | ICD-10-CM | POA: Insufficient documentation

## 2022-12-16 NOTE — Addendum Note (Signed)
Addended by: Fredderick Phenix on: 12/16/2022 09:04 AM   Modules accepted: Orders

## 2022-12-16 NOTE — Therapy (Addendum)
OUTPATIENT PHYSICAL THERAPY SHOULDER EVALUATION   Patient Name: Vanessa Wilcox MRN: [id number] DOB:Apr 16, 1981, 42 y.o., female Today's Date: 12/17/2022   PT End of Session - 12/16/22 0902     Visit Number 1    Number of Visits 7    Date for PT Re-Evaluation 02/10/23    Authorization Type BCBS    PT Start Time 0830    PT Stop Time 0900    PT Time Calculation (min) 30 min            PCP: Deatra James, MD  REFERRING PROVIDER: Santiago Glad, MD  THERAPY DIAG:  Other low back pain - Plan: PT plan of care cert/re-cert  Muscle weakness - Plan: PT plan of care cert/re-cert  Pain in thoracic spine - Plan: PT plan of care cert/re-cert  Rationale for Evaluation and Treatment Rehabilitation  Patient Name: Vanessa Wilcox MRN: [id number] DOB:07-Jan-1981, 42 y.o., female Today's Date: 12/17/2022   PT End of Session - 12/16/22 0902     Visit Number 1    Number of Visits 7    Date for PT Re-Evaluation 02/10/23    Authorization Type BCBS    PT Start Time 0830    PT Stop Time 0900    PT Time Calculation (min) 30 min             Past Medical History:  Diagnosis Date   ADHD    Anxiety    Back pain    Depression    Family history of lung cancer    Family history of multiple myeloma    HTN (hypertension), benign 02/13/2019   Pre-diabetes    STD (sexually transmitted disease)    Tx'd for chlamydia over 20 years ago   Trichomonas infection 06/12/2022   Past Surgical History:  Procedure Laterality Date   ASPIRATION BIOPSY     fluid around heart- negative results in 2000   BIOPSY  01/28/2021   Procedure: BIOPSY;  Surgeon: Quentin Ore, MD;  [facility];  Service: General;;   ESOPHAGOGASTRODUODENOSCOPY N/A 01/28/2021   Procedure: ESOPHAGOGASTRODUODENOSCOPY (EGD);  Surgeon: Quentin Ore, MD;  [facility];  Service: General;  Laterality: N/A;   fluid on heart  2000   chest tube placed   IUD removal under sedation     ROUX-EN-Y  GASTRIC BYPASS  03/04/2021   [facility]   UPPER GI ENDOSCOPY N/A 03/04/2021   Procedure: UPPER GI ENDOSCOPY;  Surgeon: Quentin Ore, MD;  [facility];  Service: General;  Laterality: N/A;   Patient Active Problem List   Diagnosis Date Noted   Anastomotic edema of gastrojejunostomy 03/08/2021   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/08/2021   Morbid obesity with BMI of 50.0-59.9, adult ([facility]) 03/04/2021   Difficult intravenous access 01/28/2021   Orthopnea 12/17/2020   Snoring 12/17/2020   Binge eating disorder 09/25/2020   Metabolic syndrome 08/13/2020   Depression 08/13/2020   Family history of lung cancer    Family history of multiple myeloma    Vitamin D deficiency 05/29/2020   Family history of ovarian cancer 05/06/2020   Family history of breast cancer 05/06/2020   Class 3 severe obesity with serious comorbidity and body mass index (BMI) of 50.0 to 59.9 in adult [facility]) 05/25/2019   Prediabetes 03/20/2019   HTN (hypertension), benign 02/13/2019    PCP: Deatra James, MD  REFERRING PROVIDER: Santiago Glad, MD  THERAPY DIAG:  Other low back pain - Plan: PT plan of care  cert/re-cert  Muscle weakness - Plan: PT plan of care cert/re-cert  Pain in thoracic spine - Plan: PT plan of care cert/re-cert  REFERRING DIAG: Hypertrophy of breast [N62]   Rationale for Evaluation and Treatment:  Rehabilitation  SUBJECTIVE:  PERTINENT PAST HISTORY:  none        PRECAUTIONS: None  WEIGHT BEARING RESTRICTIONS No  FALLS:  Has patient fallen in last 6 months? No, Number of falls: 0  MOI/History of condition:  Onset date: 4 years  SUBJECTIVE STATEMENT  Vanessa Wilcox is a 42 y.o. female who presents to clinic with chief complaint of mid and low back pain.  Pt has lost a significant amount of weight (about 110 lbs).  Her back pain was present before the weight loss for >2 years and she saw some improvement, but it still remains significant, particularly at  night.  She exercises regularly.  The pain interferes with her sleep and QOL   Pain:  Are you having pain? Yes Pain location: lower thoracic and upper lumbar spine NPRS scale:  0/10 to 9/10 Aggravating factors: sleeping, sitting in one position for a long period Relieving factors: movement and exercise Pain description: aching Stage: Chronic Stability: staying the same 24 hour pattern: worse at night   Occupation: school teacher  Assistive Device: na  Hand Dominance: na  Patient Goals/Specific Activities: reduce pain, work on mobility   OBJECTIVE:    DIAGNOSTIC FINDINGS:  NA  GENERAL OBSERVATION:  Mammary hypertrophy, forward head, rounded shoulders     SENSATION:  Light touch: Appears intact   PALPATION: TTP T10 -L2  Throacic AROM  AROM AROM  12/17/2022  Flexion WNL  Extension WNL  Right lateral flexion WNL  Left lateral flexion WNL  Right rotation   Left rotation     (Blank rows = not tested)  LUMBAR AROM  AROM AROM  12/17/2022  Flexion Fingertips to toes (WNL)  Extension WNL  Right lateral flexion WNL  Left lateral flexion WNL  Right rotation WNL  Left rotation WNL    (Blank rows = not tested)      UPPER EXTREMITY MMT:  MMT Right 12/17/2022 Left 12/17/2022  Shoulder flexion    Shoulder abduction (C5)    Shoulder ER    Shoulder IR    Middle trapezius 4 4  Lower trapezius 3+ 3+  Shoulder extension    Grip strength    Cervical flexion (C1,C2)    Cervical S/B (C3)    Shoulder shrug (C4)    Elbow flexion (C6)    Elbow ext (C7)    Thumb ext (C8)    Finger abd (T1)    Grossly     (Blank rows = not tested, score listed is out of 5 possible points.  N = WNL, D = diminished, C = clear for gross weakness with myotome testing, * = concordant pain with testing)   JOINT MOBILITY TESTING:  WNL lumbar spine  Functional tests:   Supine single leg bridge: L 8'', R 7''   TODAY'S TREATMENT:  Creating, reviewing, and completing below  HEP   PATIENT EDUCATION:  POC, diagnosis, prognosis, HEP.  Pt educated via explanation, demonstration, and handout (HEP).  Pt confirms understanding verbally.    HOME EXERCISE PROGRAM:  Access Code: [email or web address] URL: [email or web address] Date: 12/16/2022 Prepared by: Alphonzo Severance  Exercises - Sidelying Thoracic Rotation with Open Book  - 1 x daily - 7 x weekly - 1 sets - 20 reps - 2 hold - Standing Shoulder Row  with Anchored Resistance  - 1 x daily - 7 x weekly - 3 sets - 10 reps - Hip Flexor Stretch at Edge of Bed  - 2 x daily - 7 x weekly - 2 sets - 45 second hold - Supine Bridge  - 1 x daily - 7 x weekly - 1 sets - 3 reps - 30 second hold   ASSESSMENT:  CLINICAL IMPRESSION: Vanessa Wilcox is a 42 y.o. female who presents to clinic with signs and sxs consistent with chronic mid and lower back pain.  Contributing factors include tight hip flexors with concomitant increased lumbar lordosis, weak hip extensors and core, weak periscapular musculature, and mammary hypertrophy.    OBJECTIVE IMPAIRMENTS: Pain, tight hip flexors with concomitant increased lumbar lordosis, weak hip extensors and core, weak periscapular musculature  ACTIVITY LIMITATIONS: sleep, work, sitting  PERSONAL FACTORS: See medical history and pertinent history   REHAB POTENTIAL: Good  CLINICAL DECISION MAKING: Stable/uncomplicated  EVALUATION COMPLEXITY: Low   GOALS:  SHORT TERM GOALS: 01/13/2023  Vanessa Wilcox will be >75% HEP compliant throughout therapy to improve carryover between sessions and facilitate independent management of condition.  LONG TERM GOALS: 02/10/2023   Vanessa Wilcox will self report >/= 50% decrease in pain from evaluation   Evaluation/Baseline: 9/10 max pain Goal status: INITIAL   2.  Vanessa Wilcox will improve the following MMTs to >/= 4+/5 to show improvement in strength:  mid and lower traps   Evaluation/Baseline: see chart in note Goal status: INITIAL  3.  Vanessa Wilcox will report  confidence in self management of condition at time of discharge with advanced HEP  Evaluation/Baseline: unable to self manage Goal status: INITIAL    PLAN: PT FREQUENCY: 1-2x/week  PT DURATION: 8 weeks  PLANNED INTERVENTIONS: Therapeutic exercises, Aquatic therapy, Therapeutic activity, Neuro Muscular re-education, Gait training, Patient/Family education, Joint mobilization, Dry Needling, Electrical stimulation, Spinal mobilization and/or manipulation, Moist heat, Taping, Vasopneumatic device, Ionotophoresis 4mg /ml Dexamethasone, and Manual therapy  PLAN FOR NEXT SESSION: Progressive throacic mobility along with postural muscle strengthening   Alphonzo Severance PT, DPT 12/17/2022, 8:55 AM

## 2022-12-22 ENCOUNTER — Ambulatory Visit: Payer: BC Managed Care – PPO

## 2022-12-22 DIAGNOSIS — M6281 Muscle weakness (generalized): Secondary | ICD-10-CM

## 2022-12-22 DIAGNOSIS — M5459 Other low back pain: Secondary | ICD-10-CM

## 2022-12-22 DIAGNOSIS — M546 Pain in thoracic spine: Secondary | ICD-10-CM

## 2022-12-22 NOTE — Therapy (Signed)
OUTPATIENT PHYSICAL THERAPY TREATMENT NOTE   Patient Name: Vanessa Wilcox MRN: [id number] DOB:[birth year], 42 y.o., female Today's Date: 12/22/2022   PT End of Session - 12/22/22 1606     Visit Number 2    Number of Visits 7    Date for PT Re-Evaluation 02/10/23    Authorization Type BCBS    PT Start Time 1615    PT Stop Time 1655    PT Time Calculation (min) 40 min    Activity Tolerance Patient tolerated treatment well    Behavior During Therapy WFL for tasks assessed/performed              Past Medical History:  Diagnosis Date   ADHD    Anxiety    Back pain    Depression    Family history of lung cancer    Family history of multiple myeloma    HTN (hypertension), benign 02/13/2019   Pre-diabetes    STD (sexually transmitted disease)    Tx'd for chlamydia over 20 years ago   Trichomonas infection 06/12/2022   Past Surgical History:  Procedure Laterality Date   ASPIRATION BIOPSY     fluid around heart- negative results in 2000   BIOPSY  01/28/2021   Procedure: BIOPSY;  Surgeon: Quentin Ore, MD;  Location: [facility];  Service: General;;   ESOPHAGOGASTRODUODENOSCOPY N/A 01/28/2021   Procedure: ESOPHAGOGASTRODUODENOSCOPY (EGD);  Surgeon: Quentin Ore, MD;  Location: [facility];  Service: General;  Laterality: N/A;   fluid on heart  2000   chest tube placed   IUD removal under sedation     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   [facility]   UPPER GI ENDOSCOPY N/A 03/04/2021   Procedure: UPPER GI ENDOSCOPY;  Surgeon: Quentin Ore, MD;  Location: [facility];  Service: General;  Laterality: N/A;   Patient Active Problem List   Diagnosis Date Noted   Anastomotic edema of gastrojejunostomy 03/08/2021   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/08/2021   Morbid obesity with BMI of 50.0-59.9, adult (HCC) 03/04/2021   Difficult intravenous access 01/28/2021   Orthopnea 12/17/2020   Snoring 12/17/2020   Binge eating disorder 09/25/2020    Metabolic syndrome 08/13/2020   Depression 08/13/2020   Family history of lung cancer    Family history of multiple myeloma    Vitamin D deficiency 05/29/2020   Family history of ovarian cancer 05/06/2020   Family history of breast cancer 05/06/2020   Class 3 severe obesity with serious comorbidity and body mass index (BMI) of 50.0 to 59.9 in adult [facility]) 05/25/2019   Prediabetes 03/20/2019   HTN (hypertension), benign 02/13/2019    PCP: Deatra James, MD  REFERRING PROVIDER: Deatra James, MD  THERAPY DIAG:  Other low back pain  Muscle weakness  Pain in thoracic spine  REFERRING DIAG: Hypertrophy of breast [N62]   Rationale for Evaluation and Treatment:  Rehabilitation  SUBJECTIVE:  PERTINENT PAST HISTORY:  none        PRECAUTIONS: None  WEIGHT BEARING RESTRICTIONS No  FALLS:  Has patient fallen in last 6 months? No, Number of falls: 0  MOI/History of condition:  Onset date: 4 years  SUBJECTIVE STATEMENT Patient reports 4/10 pain today, HEP compliance.    Pain:  Are you having pain? Yes Pain location: lower thoracic and upper lumbar spine NPRS scale:  0/10 to 9/10 Aggravating factors: sleeping, sitting in one position for a long period Relieving factors: movement and exercise Pain description: aching Stage: Chronic Stability: staying  the same 24 hour pattern: worse at night   Occupation: school teacher  Assistive Device: na  Hand Dominance: na  Patient Goals/Specific Activities: reduce pain, work on mobility   OBJECTIVE:    DIAGNOSTIC FINDINGS:  NA  GENERAL OBSERVATION:  Mammary hypertrophy, forward head, rounded shoulders     SENSATION:  Light touch: Appears intact   PALPATION: TTP T10 -L2  Throacic AROM  AROM AROM  12/22/2022  Flexion WNL  Extension WNL  Right lateral flexion WNL  Left lateral flexion WNL  Right rotation   Left rotation     (Blank rows = not tested)  LUMBAR AROM  AROM AROM  12/22/2022  Flexion  Fingertips to toes (WNL)  Extension WNL  Right lateral flexion WNL  Left lateral flexion WNL  Right rotation WNL  Left rotation WNL    (Blank rows = not tested)      UPPER EXTREMITY MMT:  MMT Right 12/22/2022 Left 12/22/2022  Shoulder flexion    Shoulder abduction (C5)    Shoulder ER    Shoulder IR    Middle trapezius 4 4  Lower trapezius 3+ 3+  Shoulder extension    Grip strength    Cervical flexion (C1,C2)    Cervical S/B (C3)    Shoulder shrug (C4)    Elbow flexion (C6)    Elbow ext (C7)    Thumb ext (C8)    Finger abd (T1)    Grossly     (Blank rows = not tested, score listed is out of 5 possible points.  N = WNL, D = diminished, C = clear for gross weakness with myotome testing, * = concordant pain with testing)   JOINT MOBILITY TESTING:  WNL lumbar spine  Functional tests:   Supine single leg bridge: L 8'', R 7''   TODAY'S TREATMENT:  Creating, reviewing, and completing below HEP   PATIENT EDUCATION:  POC, diagnosis, prognosis, HEP.  Pt educated via explanation, demonstration, and handout (HEP).  Pt confirms understanding verbally.    HOME EXERCISE PROGRAM:  Access Code: [email or web address] URL: [email or web address] Date: 12/16/2022 Prepared by: Alphonzo Severance  Exercises - Sidelying Thoracic Rotation with Open Book  - 1 x daily - 7 x weekly - 1 sets - 20 reps - 2 hold - Standing Shoulder Row with Anchored Resistance  - 1 x daily - 7 x weekly - 3 sets - 10 reps - Hip Flexor Stretch at Edge of Bed  - 2 x daily - 7 x weekly - 2 sets - 45 second hold - Supine Bridge  - 1 x daily - 7 x weekly - 1 sets - 3 reps - 30 second hold  TREATMENT [facility] Adult PT Treatment:                                                DATE: 12/22/22 Therapeutic Exercise: Nustep level 5 x 5 mins Standing hip abduction/extension RTB at ankles 2x10 each BIL Lat pull down 30# 2x10 High/low rows 30# 2x10 Seated horizontal abduction GTB 2x10 Seated diagonals GTB x10  BIL Modified thomas stretch EOM x1' BIL Bridges 2x10 Supine 90/90 isometric hold 2x20" LTR x10 BIL Prone on elbows x1' Sidelying open books x10 BIL    ASSESSMENT:  CLINICAL IMPRESSION: Patient presents to first follow up PT session reporting continued mid and lower back pain and reports that the  home exercises have been helpful. Session today focused on proximal hip, core, and periscapular strengthening. Patient was able to tolerate all prescribed exercises with no adverse effects. Patient continues to benefit from skilled PT services and should be progressed as able to improve functional independence.    OBJECTIVE IMPAIRMENTS: Pain, tight hip flexors with concomitant increased lumbar lordosis, weak hip extensors and core, weak periscapular musculature  ACTIVITY LIMITATIONS: sleep, work, sitting  PERSONAL FACTORS: See medical history and pertinent history   REHAB POTENTIAL: Good  CLINICAL DECISION MAKING: Stable/uncomplicated  EVALUATION COMPLEXITY: Low   GOALS:  SHORT TERM GOALS: 01/13/2023  Nayzeth will be >75% HEP compliant throughout therapy to improve carryover between sessions and facilitate independent management of condition.  LONG TERM GOALS: 02/10/2023   July will self report >/= 50% decrease in pain from evaluation   Evaluation/Baseline: 9/10 max pain Goal status: INITIAL   2.  Deltha will improve the following MMTs to >/= 4+/5 to show improvement in strength:  mid and lower traps   Evaluation/Baseline: see chart in note Goal status: INITIAL  3.  Lilliahna will report confidence in self management of condition at time of discharge with advanced HEP  Evaluation/Baseline: unable to self manage Goal status: INITIAL    PLAN: PT FREQUENCY: 1-2x/week  PT DURATION: 8 weeks  PLANNED INTERVENTIONS: Therapeutic exercises, Aquatic therapy, Therapeutic activity, Neuro Muscular re-education, Gait training, Patient/Family education, Joint mobilization, Dry  Needling, Electrical stimulation, Spinal mobilization and/or manipulation, Moist heat, Taping, Vasopneumatic device, Ionotophoresis 4mg /ml Dexamethasone, and Manual therapy  PLAN FOR NEXT SESSION: Progressive throacic mobility along with postural muscle strengthening   Berta Minor PTA 12/22/2022, 4:53 PM

## 2022-12-31 ENCOUNTER — Ambulatory Visit: Payer: BC Managed Care – PPO

## 2022-12-31 DIAGNOSIS — M5459 Other low back pain: Secondary | ICD-10-CM | POA: Diagnosis not present

## 2022-12-31 DIAGNOSIS — M6281 Muscle weakness (generalized): Secondary | ICD-10-CM

## 2022-12-31 DIAGNOSIS — M546 Pain in thoracic spine: Secondary | ICD-10-CM

## 2022-12-31 NOTE — Therapy (Signed)
OUTPATIENT PHYSICAL THERAPY TREATMENT NOTE   Patient Name: Vanessa Wilcox MRN: [id number] DOB:12/19/1980, 42 y.o., female Today's Date: 12/31/2022   PT End of Session - 12/31/22 0957     Visit Number 3    Number of Visits 7    Date for PT Re-Evaluation 02/10/23    Authorization Type BCBS    PT Start Time 1000    PT Stop Time 1040    PT Time Calculation (min) 40 min    Activity Tolerance Patient tolerated treatment well    Behavior During Therapy WFL for tasks assessed/performed               Past Medical History:  Diagnosis Date   ADHD    Anxiety    Back pain    Depression    Family history of lung cancer    Family history of multiple myeloma    HTN (hypertension), benign 02/13/2019   Pre-diabetes    STD (sexually transmitted disease)    Tx'd for chlamydia over 20 years ago   Trichomonas infection 06/12/2022   Past Surgical History:  Procedure Laterality Date   ASPIRATION BIOPSY     fluid around heart- negative results in 2000   BIOPSY  01/28/2021   Procedure: BIOPSY;  Surgeon: Quentin Ore, MD;  Location: [facility];  Service: General;;   ESOPHAGOGASTRODUODENOSCOPY N/A 01/28/2021   Procedure: ESOPHAGOGASTRODUODENOSCOPY (EGD);  Surgeon: Quentin Ore, MD;  Location: [facility];  Service: General;  Laterality: N/A;   fluid on heart  2000   chest tube placed   IUD removal under sedation     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   [facility]   UPPER GI ENDOSCOPY N/A 03/04/2021   Procedure: UPPER GI ENDOSCOPY;  Surgeon: Quentin Ore, MD;  Location: [facility];  Service: General;  Laterality: N/A;   Patient Active Problem List   Diagnosis Date Noted   Anastomotic edema of gastrojejunostomy 03/08/2021   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/08/2021   Morbid obesity with BMI of 50.0-59.9, adult (HCC) 03/04/2021   Difficult intravenous access 01/28/2021   Orthopnea 12/17/2020   Snoring 12/17/2020   Binge eating disorder  09/25/2020   Metabolic syndrome 08/13/2020   Depression 08/13/2020   Family history of lung cancer    Family history of multiple myeloma    Vitamin D deficiency 05/29/2020   Family history of ovarian cancer 05/06/2020   Family history of breast cancer 05/06/2020   Class 3 severe obesity with serious comorbidity and body mass index (BMI) of 50.0 to 59.9 in adult [facility]) 05/25/2019   Prediabetes 03/20/2019   HTN (hypertension), benign 02/13/2019    PCP: Deatra James, MD  REFERRING PROVIDER: Deatra James, MD  THERAPY DIAG:  Other low back pain  Muscle weakness  Pain in thoracic spine  REFERRING DIAG: Hypertrophy of breast [N62]   Rationale for Evaluation and Treatment:  Rehabilitation  SUBJECTIVE:  PERTINENT PAST HISTORY:  none        PRECAUTIONS: None  WEIGHT BEARING RESTRICTIONS No  FALLS:  Has patient fallen in last 6 months? No, Number of falls: 0  MOI/History of condition:  Onset date: 4 years  SUBJECTIVE STATEMENT Patient reports mild lower back pain at 1/10 today, states that she feels like she "needs to be stretched out."   Pain:  Are you having pain? Yes Pain location: lower thoracic and upper lumbar spine NPRS scale:  0/10 to 9/10 Aggravating factors: sleeping, sitting in one position for a long  period Relieving factors: movement and exercise Pain description: aching Stage: Chronic Stability: staying the same 24 hour pattern: worse at night   Occupation: school teacher  Assistive Device: na  Hand Dominance: na  Patient Goals/Specific Activities: reduce pain, work on mobility   OBJECTIVE:    DIAGNOSTIC FINDINGS:  NA  GENERAL OBSERVATION:  Mammary hypertrophy, forward head, rounded shoulders     SENSATION:  Light touch: Appears intact   PALPATION: TTP T10 -L2  Throacic AROM  AROM AROM  12/31/2022  Flexion WNL  Extension WNL  Right lateral flexion WNL  Left lateral flexion WNL  Right rotation   Left rotation     (Blank rows  = not tested)  LUMBAR AROM  AROM AROM  12/31/2022  Flexion Fingertips to toes (WNL)  Extension WNL  Right lateral flexion WNL  Left lateral flexion WNL  Right rotation WNL  Left rotation WNL    (Blank rows = not tested)      UPPER EXTREMITY MMT:  MMT Right 12/31/2022 Left 12/31/2022  Shoulder flexion    Shoulder abduction (C5)    Shoulder ER    Shoulder IR    Middle trapezius 4 4  Lower trapezius 3+ 3+  Shoulder extension    Grip strength    Cervical flexion (C1,C2)    Cervical S/B (C3)    Shoulder shrug (C4)    Elbow flexion (C6)    Elbow ext (C7)    Thumb ext (C8)    Finger abd (T1)    Grossly     (Blank rows = not tested, score listed is out of 5 possible points.  N = WNL, D = diminished, C = clear for gross weakness with myotome testing, * = concordant pain with testing)   JOINT MOBILITY TESTING:  WNL lumbar spine  Functional tests:   Supine single leg bridge: L 8'', R 7''   TODAY'S TREATMENT:  Creating, reviewing, and completing below HEP   PATIENT EDUCATION:  POC, diagnosis, prognosis, HEP.  Pt educated via explanation, demonstration, and handout (HEP).  Pt confirms understanding verbally.    HOME EXERCISE PROGRAM:  Access Code: [email or web address] URL: [email or web address] Date: 12/16/2022 Prepared by: Alphonzo Severance  Exercises - Sidelying Thoracic Rotation with Open Book  - 1 x daily - 7 x weekly - 1 sets - 20 reps - 2 hold - Standing Shoulder Row with Anchored Resistance  - 1 x daily - 7 x weekly - 3 sets - 10 reps - Hip Flexor Stretch at Edge of Bed  - 2 x daily - 7 x weekly - 2 sets - 45 second hold - Supine Bridge  - 1 x daily - 7 x weekly - 1 sets - 3 reps - 30 second hold  TREATMENT [facility] Adult PT Treatment:                                                DATE: 12/31/22 Therapeutic Exercise: Nustep level 5 x 5 mins Elliptical x 3 mins  Standing hip abduction/extension RTB at ankles 2x10 each BIL Lat pull down 30#  2x10 High/low rows 30# 2x10 Seated BIL ER with scap retraction GTB 2x10 Thomas stretch EOM with SKTC x1' BIL Prone quad stretch with strap x1' BIL Bridges with clamshell GTB 2x10   [facility] Adult PT Treatment:  DATE: 12/22/22 Therapeutic Exercise: Nustep level 5 x 5 mins Standing hip abduction/extension RTB at ankles 2x10 each BIL Lat pull down 30# 2x10 High/low rows 30# 2x10 Seated horizontal abduction GTB 2x10 Seated diagonals GTB x10 BIL Modified thomas stretch EOM x1' BIL Bridges 2x10 Supine 90/90 isometric hold 2x20" LTR x10 BIL Prone on elbows x1' Sidelying open books x10 BIL    ASSESSMENT:  CLINICAL IMPRESSION: Patient presents to PT reporting up to mild back pain in her lower back. Session today continued to focus on periscapular, core, and proximal hip strengthening as well as stretching for quads and hip flexors. She expressed interest in learning elliptical machine to perform at the gym, practiced form and pacing and safe use of elliptical machine to good effect. Patient was able to tolerate all prescribed exercises with no adverse effects. Patient continues to benefit from skilled PT services and should be progressed as able to improve functional independence.    OBJECTIVE IMPAIRMENTS: Pain, tight hip flexors with concomitant increased lumbar lordosis, weak hip extensors and core, weak periscapular musculature  ACTIVITY LIMITATIONS: sleep, work, sitting  PERSONAL FACTORS: See medical history and pertinent history   REHAB POTENTIAL: Good  CLINICAL DECISION MAKING: Stable/uncomplicated  EVALUATION COMPLEXITY: Low   GOALS:  SHORT TERM GOALS: 01/13/2023  Nakeysha will be >75% HEP compliant throughout therapy to improve carryover between sessions and facilitate independent management of condition.  LONG TERM GOALS: 02/10/2023   Haidee will self report >/= 50% decrease in pain from evaluation   Evaluation/Baseline: 9/10  max pain Goal status: INITIAL   2.  Muskan will improve the following MMTs to >/= 4+/5 to show improvement in strength:  mid and lower traps   Evaluation/Baseline: see chart in note Goal status: INITIAL  3.  Dorotha will report confidence in self management of condition at time of discharge with advanced HEP  Evaluation/Baseline: unable to self manage Goal status: INITIAL    PLAN: PT FREQUENCY: 1-2x/week  PT DURATION: 8 weeks  PLANNED INTERVENTIONS: Therapeutic exercises, Aquatic therapy, Therapeutic activity, Neuro Muscular re-education, Gait training, Patient/Family education, Joint mobilization, Dry Needling, Electrical stimulation, Spinal mobilization and/or manipulation, Moist heat, Taping, Vasopneumatic device, Ionotophoresis 4mg /ml Dexamethasone, and Manual therapy  PLAN FOR NEXT SESSION: Progressive throacic mobility along with postural muscle strengthening   Berta Minor PTA 12/31/2022, 10:41 AM

## 2023-01-05 ENCOUNTER — Ambulatory Visit: Payer: BC Managed Care – PPO | Attending: Plastic Surgery

## 2023-01-05 DIAGNOSIS — M546 Pain in thoracic spine: Secondary | ICD-10-CM | POA: Insufficient documentation

## 2023-01-05 DIAGNOSIS — M5459 Other low back pain: Secondary | ICD-10-CM | POA: Diagnosis present

## 2023-01-05 DIAGNOSIS — M6281 Muscle weakness (generalized): Secondary | ICD-10-CM | POA: Insufficient documentation

## 2023-01-05 NOTE — Therapy (Signed)
OUTPATIENT PHYSICAL THERAPY TREATMENT NOTE   Patient Name: Arkesha Raysor MRN: [id number] DOB:[birth year], 42 y.o., female Today's Date: 01/05/2023   PT End of Session - 01/05/23 0954     Visit Number 4    Number of Visits 7    Date for PT Re-Evaluation 02/10/23    Authorization Type BCBS    PT Start Time 1000    PT Stop Time 1044    PT Time Calculation (min) 44 min    Activity Tolerance Patient tolerated treatment well    Behavior During Therapy WFL for tasks assessed/performed                Past Medical History:  Diagnosis Date   ADHD    Anxiety    Back pain    Depression    Family history of lung cancer    Family history of multiple myeloma    HTN (hypertension), benign 02/13/2019   Pre-diabetes    STD (sexually transmitted disease)    Tx'd for chlamydia over 20 years ago   Trichomonas infection 06/12/2022   Past Surgical History:  Procedure Laterality Date   ASPIRATION BIOPSY     fluid around heart- negative results in 2000   BIOPSY  01/28/2021   Procedure: BIOPSY;  Surgeon: Quentin Ore, MD;  Location: [facility];  Service: General;;   ESOPHAGOGASTRODUODENOSCOPY N/A 01/28/2021   Procedure: ESOPHAGOGASTRODUODENOSCOPY (EGD);  Surgeon: Quentin Ore, MD;  Location: [facility];  Service: General;  Laterality: N/A;   fluid on heart  2000   chest tube placed   IUD removal under sedation     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   [facility]   UPPER GI ENDOSCOPY N/A 03/04/2021   Procedure: UPPER GI ENDOSCOPY;  Surgeon: Quentin Ore, MD;  Location: [facility];  Service: General;  Laterality: N/A;   Patient Active Problem List   Diagnosis Date Noted   Anastomotic edema of gastrojejunostomy 03/08/2021   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/08/2021   Morbid obesity with BMI of 50.0-59.9, adult (HCC) 03/04/2021   Difficult intravenous access 01/28/2021   Orthopnea 12/17/2020   Snoring 12/17/2020   Binge eating disorder  09/25/2020   Metabolic syndrome 08/13/2020   Depression 08/13/2020   Family history of lung cancer    Family history of multiple myeloma    Vitamin D deficiency 05/29/2020   Family history of ovarian cancer 05/06/2020   Family history of breast cancer 05/06/2020   Class 3 severe obesity with serious comorbidity and body mass index (BMI) of 50.0 to 59.9 in adult [facility]) 05/25/2019   Prediabetes 03/20/2019   HTN (hypertension), benign 02/13/2019    PCP: Deatra James, MD  REFERRING PROVIDER: Santiago Glad, MD  THERAPY DIAG:  Other low back pain  Muscle weakness  Pain in thoracic spine  REFERRING DIAG: Hypertrophy of breast [N62]   Rationale for Evaluation and Treatment:  Rehabilitation  SUBJECTIVE:  PERTINENT PAST HISTORY:  none        PRECAUTIONS: None  WEIGHT BEARING RESTRICTIONS No  FALLS:  Has patient fallen in last 6 months? No, Number of falls: 0  MOI/History of condition:  Onset date: 4 years  SUBJECTIVE STATEMENT Patient reporting that she hasn't been able to try the elliptical since her last visit. She does have some back pain this morning and hasn't done her stretches yet.    Pain:  Are you having pain? Yes Pain location: lower thoracic and upper lumbar spine NPRS scale:  0/10  to 9/10 Aggravating factors: sleeping, sitting in one position for a long period Relieving factors: movement and exercise Pain description: aching Stage: Chronic Stability: staying the same 24 hour pattern: worse at night   Occupation: school teacher  Assistive Device: na  Hand Dominance: na  Patient Goals/Specific Activities: reduce pain, work on mobility   OBJECTIVE:    DIAGNOSTIC FINDINGS:  NA  GENERAL OBSERVATION:  Mammary hypertrophy, forward head, rounded shoulders     SENSATION:  Light touch: Appears intact   PALPATION: TTP T10 -L2  Throacic AROM  AROM AROM  eval  Flexion WNL  Extension WNL  Right lateral flexion WNL  Left lateral flexion  WNL  Right rotation   Left rotation     (Blank rows = not tested)  LUMBAR AROM  AROM AROM  eval  Flexion Fingertips to toes (WNL)  Extension WNL  Right lateral flexion WNL  Left lateral flexion WNL  Right rotation WNL  Left rotation WNL    (Blank rows = not tested)      UPPER EXTREMITY MMT:  MMT Right eval Left eval  Shoulder flexion    Shoulder abduction (C5)    Shoulder ER    Shoulder IR    Middle trapezius 4 4  Lower trapezius 3+ 3+  Shoulder extension    Grip strength    Cervical flexion (C1,C2)    Cervical S/B (C3)    Shoulder shrug (C4)    Elbow flexion (C6)    Elbow ext (C7)    Thumb ext (C8)    Finger abd (T1)    Grossly     (Blank rows = not tested, score listed is out of 5 possible points.  N = WNL, D = diminished, C = clear for gross weakness with myotome testing, * = concordant pain with testing)   JOINT MOBILITY TESTING:  WNL lumbar spine  Functional tests:   Supine single leg bridge: L 8'', R 7''   TODAY'S TREATMENT:    [facility] Adult PT Treatment:                                                DATE: 01/05/2023  Therapeutic Exercise: Elliptical x 5 mins, level 1, incline 10  Supine LTR x 2'  Thomas stretch EOM with SKTC, 3 x 30" BIL  Prone quad stretch with strap, 3 x 30" BIL S/L thoracic rotation (open books), x 15 BIL  Seated BIL ER with scap retraction GTB 2x15 Seated Horizontal Abduction GTB 2 x 15  Standing hip abduction/extension GTB at ankles 2x10 each BIL Shoulder ext/pull downs, 2x20 Blue TB   [facility] Adult PT Treatment:                                                DATE: 12/31/22 Therapeutic Exercise: Nustep level 5 x 5 mins Elliptical x 3 mins  Standing hip abduction/extension RTB at ankles 2x10 each BIL Lat pull down 30# 2x10 High/low rows 30# 2x10 Seated BIL ER with scap retraction GTB 2x10 Thomas stretch EOM with SKTC x1' BIL Prone quad stretch with strap x1' BIL Bridges with clamshell GTB 2x10   [facility] Adult PT  Treatment:  DATE: 12/22/22 Therapeutic Exercise: Nustep level 5 x 5 mins Standing hip abduction/extension RTB at ankles 2x10 each BIL Lat pull down 30# 2x10 High/low rows 30# 2x10 Seated horizontal abduction GTB 2x10 Seated diagonals GTB x10 BIL Modified thomas stretch EOM x1' BIL Bridges 2x10 Supine 90/90 isometric hold 2x20" LTR x10 BIL Prone on elbows x1' Sidelying open books x10 BIL   PATIENT EDUCATION:  POC, diagnosis, prognosis, HEP.  Pt educated via explanation, demonstration, and handout (HEP).  Pt confirms understanding verbally.    HOME EXERCISE PROGRAM:  Access Code: [email or web address] URL: [email or web address] Date: 12/16/2022 Prepared by: Alphonzo Severance  Exercises - Sidelying Thoracic Rotation with Open Book  - 1 x daily - 7 x weekly - 1 sets - 20 reps - 2 hold - Standing Shoulder Row with Anchored Resistance  - 1 x daily - 7 x weekly - 3 sets - 10 reps - Hip Flexor Stretch at Edge of Bed  - 2 x daily - 7 x weekly - 2 sets - 45 second hold - Supine Bridge  - 1 x daily - 7 x weekly - 1 sets - 3 reps - 30 second hold     ASSESSMENT:  CLINICAL IMPRESSION:  Dona was able to tolerate increased resistance with exercises today. Session today continued to focus on periscapular, core, and proximal hip strengthening as well as stretching for quads and hip flexors. We will continue to progress per POC in order to improve overall function.    OBJECTIVE IMPAIRMENTS: Pain, tight hip flexors with concomitant increased lumbar lordosis, weak hip extensors and core, weak periscapular musculature  ACTIVITY LIMITATIONS: sleep, work, sitting  PERSONAL FACTORS: See medical history and pertinent history   REHAB POTENTIAL: Good  CLINICAL DECISION MAKING: Stable/uncomplicated  EVALUATION COMPLEXITY: Low   GOALS:  SHORT TERM GOALS: 01/13/2023  Shavonte will be >75% HEP compliant throughout therapy to improve carryover  between sessions and facilitate independent management of condition.  LONG TERM GOALS: 02/10/2023   Edyn will self report >/= 50% decrease in pain from evaluation   Evaluation/Baseline: 9/10 max pain Goal status: INITIAL   2.  Mozelle will improve the following MMTs to >/= 4+/5 to show improvement in strength:  mid and lower traps   Evaluation/Baseline: see chart in note Goal status: INITIAL  3.  Eevie will report confidence in self management of condition at time of discharge with advanced HEP  Evaluation/Baseline: unable to self manage Goal status: INITIAL    PLAN: PT FREQUENCY: 1-2x/week  PT DURATION: 8 weeks  PLANNED INTERVENTIONS: Therapeutic exercises, Aquatic therapy, Therapeutic activity, Neuro Muscular re-education, Gait training, Patient/Family education, Joint mobilization, Dry Needling, Electrical stimulation, Spinal mobilization and/or manipulation, Moist heat, Taping, Vasopneumatic device, Ionotophoresis 4mg /ml Dexamethasone, and Manual therapy  PLAN FOR NEXT SESSION: Progressive throacic mobility along with postural muscle strengthening   Mauri Reading PT, DPT  01/05/2023, 10:51 AM

## 2023-01-12 ENCOUNTER — Ambulatory Visit: Payer: BC Managed Care – PPO

## 2023-01-12 DIAGNOSIS — M5459 Other low back pain: Secondary | ICD-10-CM

## 2023-01-12 DIAGNOSIS — M6281 Muscle weakness (generalized): Secondary | ICD-10-CM

## 2023-01-12 DIAGNOSIS — M546 Pain in thoracic spine: Secondary | ICD-10-CM

## 2023-01-12 NOTE — Therapy (Signed)
OUTPATIENT PHYSICAL THERAPY TREATMENT NOTE   Patient Name: Vanessa Wilcox MRN: [id number] DOB:Jan 04, 1981, 42 y.o., female Today's Date: 01/12/2023   PT End of Session - 01/12/23 0957     Visit Number 5    Number of Visits 7    Date for PT Re-Evaluation 02/10/23    Authorization Type BCBS    PT Start Time 1000    PT Stop Time 1040    PT Time Calculation (min) 40 min    Activity Tolerance Patient tolerated treatment well    Behavior During Therapy WFL for tasks assessed/performed             Past Medical History:  Diagnosis Date   ADHD    Anxiety    Back pain    Depression    Family history of lung cancer    Family history of multiple myeloma    HTN (hypertension), benign 02/13/2019   Pre-diabetes    STD (sexually transmitted disease)    Tx'd for chlamydia over 20 years ago   Trichomonas infection 06/12/2022   Past Surgical History:  Procedure Laterality Date   ASPIRATION BIOPSY     fluid around heart- negative results in 2000   BIOPSY  01/28/2021   Procedure: BIOPSY;  Surgeon: Quentin Ore, MD;  [facility]: [facility];  Service: General;;   ESOPHAGOGASTRODUODENOSCOPY N/A 01/28/2021   Procedure: ESOPHAGOGASTRODUODENOSCOPY (EGD);  Surgeon: Quentin Ore, MD;  Location: [facility];  Service: General;  Laterality: N/A;   fluid on heart  2000   chest tube placed   IUD removal under sedation     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   [facility]   UPPER GI ENDOSCOPY N/A 03/04/2021   Procedure: UPPER GI ENDOSCOPY;  Surgeon: Quentin Ore, MD;  Location: [facility];  Service: General;  Laterality: N/A;   Patient Active Problem List   Diagnosis Date Noted   Anastomotic edema of gastrojejunostomy 03/08/2021   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/08/2021   Morbid obesity with BMI of 50.0-59.9, adult (HCC) 03/04/2021   Difficult intravenous access 01/28/2021   Orthopnea 12/17/2020   Snoring 12/17/2020   Binge eating disorder 09/25/2020    Metabolic syndrome 08/13/2020   Depression 08/13/2020   Family history of lung cancer    Family history of multiple myeloma    Vitamin D deficiency 05/29/2020   Family history of ovarian cancer 05/06/2020   Family history of breast cancer 05/06/2020   Class 3 severe obesity with serious comorbidity and body mass index (BMI) of 50.0 to 59.9 in adult [facility]) 05/25/2019   Prediabetes 03/20/2019   HTN (hypertension), benign 02/13/2019    PCP: Deatra James, MD  REFERRING PROVIDER: Deatra James, MD  THERAPY DIAG:  Muscle weakness  Other low back pain  Pain in thoracic spine  REFERRING DIAG: Hypertrophy of breast [N62]   Rationale for Evaluation and Treatment:  Rehabilitation  SUBJECTIVE:  PERTINENT PAST HISTORY:  none        PRECAUTIONS: None  WEIGHT BEARING RESTRICTIONS No  FALLS:  Has patient fallen in last 6 months? No, Number of falls: 0  MOI/History of condition:  Onset date: 4 years  SUBJECTIVE STATEMENT Patient reports that she has been busy with the approaching school year and hasn't been able to try out the elliptical at the gym yet or use her stretch strap yet. She states her back is not bothering her too much today.   Pain:  Are you having pain? Yes Pain location: lower thoracic  and upper lumbar spine NPRS scale:  0/10 to 9/10 Aggravating factors: sleeping, sitting in one position for a long period Relieving factors: movement and exercise Pain description: aching Stage: Chronic Stability: staying the same 24 hour pattern: worse at night   Occupation: school teacher  Assistive Device: na  Hand Dominance: na  Patient Goals/Specific Activities: reduce pain, work on mobility   OBJECTIVE:    DIAGNOSTIC FINDINGS:  NA  GENERAL OBSERVATION:  Mammary hypertrophy, forward head, rounded shoulders     SENSATION:  Light touch: Appears intact   PALPATION: TTP T10 -L2  Throacic AROM  AROM AROM  eval  Flexion WNL  Extension WNL  Right lateral  flexion WNL  Left lateral flexion WNL  Right rotation   Left rotation     (Blank rows = not tested)  LUMBAR AROM  AROM AROM  eval  Flexion Fingertips to toes (WNL)  Extension WNL  Right lateral flexion WNL  Left lateral flexion WNL  Right rotation WNL  Left rotation WNL    (Blank rows = not tested)      UPPER EXTREMITY MMT:  MMT Right eval Left eval  Shoulder flexion    Shoulder abduction (C5)    Shoulder ER    Shoulder IR    Middle trapezius 4 4  Lower trapezius 3+ 3+  Shoulder extension    Grip strength    Cervical flexion (C1,C2)    Cervical S/B (C3)    Shoulder shrug (C4)    Elbow flexion (C6)    Elbow ext (C7)    Thumb ext (C8)    Finger abd (T1)    Grossly     (Blank rows = not tested, score listed is out of 5 possible points.  N = WNL, D = diminished, C = clear for gross weakness with myotome testing, * = concordant pain with testing)   JOINT MOBILITY TESTING:  WNL lumbar spine  Functional tests:   Supine single leg bridge: L 8'', R 7''   TODAY'S TREATMENT:   [facility] Adult PT Treatment:                                                DATE: 01/12/23 Therapeutic Exercise: Elliptical x 5 mins, level 1, incline 10  Pallof press 10# 2x10 BIL Supine LTR x 2'  Thomas stretch EOM with SKTC, x1' BIL  Prone quad stretch with strap, 3 x 30" BIL Seated BIL ER with scap retraction GTB 2x15 Seated Horizontal Abduction GTB 2 x 15  Standing hip abduction/extension GTB at ankles 2x10 each BIL Shoulder ext/pull downs two cables 10# 2x10   [facility] Adult PT Treatment:                                                DATE: 01/05/2023  Therapeutic Exercise: Elliptical x 5 mins, level 1, incline 10  Supine LTR x 2'  Thomas stretch EOM with SKTC, 3 x 30" BIL  Prone quad stretch with strap, 3 x 30" BIL S/L thoracic rotation (open books), x 15 BIL  Seated BIL ER with scap retraction GTB 2x15 Seated Horizontal Abduction GTB 2 x 15  Standing hip abduction/extension GTB  at ankles 2x10 each BIL Shoulder ext/pull  downs, 2x20 Blue TB   [facility] Adult PT Treatment:                                                DATE: 12/31/22 Therapeutic Exercise: Nustep level 5 x 5 mins Elliptical x 3 mins  Standing hip abduction/extension RTB at ankles 2x10 each BIL Lat pull down 30# 2x10 High/low rows 30# 2x10 Seated BIL ER with scap retraction GTB 2x10 Thomas stretch EOM with SKTC x1' BIL Prone quad stretch with strap x1' BIL Bridges with clamshell GTB 2x10    PATIENT EDUCATION:  POC, diagnosis, prognosis, HEP.  Pt educated via explanation, demonstration, and handout (HEP).  Pt confirms understanding verbally.    HOME EXERCISE PROGRAM:  Access Code: [email or web address] URL: [email or web address] Date: 12/16/2022 Prepared by: Alphonzo Severance  Exercises - Sidelying Thoracic Rotation with Open Book  - 1 x daily - 7 x weekly - 1 sets - 20 reps - 2 hold - Standing Shoulder Row with Anchored Resistance  - 1 x daily - 7 x weekly - 3 sets - 10 reps - Hip Flexor Stretch at Edge of Bed  - 2 x daily - 7 x weekly - 2 sets - 45 second hold - Supine Bridge  - 1 x daily - 7 x weekly - 1 sets - 3 reps - 30 second hold     ASSESSMENT:  CLINICAL IMPRESSION: Patient presents to PT reporting decreased overall back pain today and states that she is beginning to get busy with the school year approaching. Session today continued to focus on core, periscapular, and proximal hip strengthening. Patient was able to tolerate all prescribed exercises with no adverse effects. Patient continues to benefit from skilled PT services and should be progressed as able to improve functional independence.   OBJECTIVE IMPAIRMENTS: Pain, tight hip flexors with concomitant increased lumbar lordosis, weak hip extensors and core, weak periscapular musculature  ACTIVITY LIMITATIONS: sleep, work, sitting  PERSONAL FACTORS: See medical history and pertinent history   REHAB POTENTIAL:  Good  CLINICAL DECISION MAKING: Stable/uncomplicated  EVALUATION COMPLEXITY: Low   GOALS:  SHORT TERM GOALS: 01/13/2023  Ronie will be >75% HEP compliant throughout therapy to improve carryover between sessions and facilitate independent management of condition.  LONG TERM GOALS: 02/10/2023   Kharis will self report >/= 50% decrease in pain from evaluation   Evaluation/Baseline: 9/10 max pain Goal status: INITIAL   2.  Tasheema will improve the following MMTs to >/= 4+/5 to show improvement in strength:  mid and lower traps   Evaluation/Baseline: see chart in note Goal status: INITIAL  3.  Evana will report confidence in self management of condition at time of discharge with advanced HEP  Evaluation/Baseline: unable to self manage Goal status: INITIAL    PLAN: PT FREQUENCY: 1-2x/week  PT DURATION: 8 weeks  PLANNED INTERVENTIONS: Therapeutic exercises, Aquatic therapy, Therapeutic activity, Neuro Muscular re-education, Gait training, Patient/Family education, Joint mobilization, Dry Needling, Electrical stimulation, Spinal mobilization and/or manipulation, Moist heat, Taping, Vasopneumatic device, Ionotophoresis 4mg /ml Dexamethasone, and Manual therapy  PLAN FOR NEXT SESSION: Progressive throacic mobility along with postural muscle strengthening   Berta Minor PTA 01/12/2023, 10:40 AM

## 2023-01-19 ENCOUNTER — Ambulatory Visit: Payer: BC Managed Care – PPO

## 2023-01-19 DIAGNOSIS — M546 Pain in thoracic spine: Secondary | ICD-10-CM

## 2023-01-19 DIAGNOSIS — M6281 Muscle weakness (generalized): Secondary | ICD-10-CM

## 2023-01-19 DIAGNOSIS — M5459 Other low back pain: Secondary | ICD-10-CM

## 2023-01-19 NOTE — Therapy (Signed)
OUTPATIENT PHYSICAL THERAPY TREATMENT NOTE   Patient Name: Vanessa Wilcox MRN: [id number] DOB:05/27/1981, 42 y.o., female Today's Date: 01/19/2023   PT End of Session - 01/19/23 0951     Visit Number 6    Number of Visits 7    Date for PT Re-Evaluation 02/10/23    Authorization Type BCBS    PT Start Time 1000    PT Stop Time 1040    PT Time Calculation (min) 40 min    Activity Tolerance Patient tolerated treatment well    Behavior During Therapy WFL for tasks assessed/performed              Past Medical History:  Diagnosis Date   ADHD    Anxiety    Back pain    Depression    Family history of lung cancer    Family history of multiple myeloma    HTN (hypertension), benign 02/13/2019   Pre-diabetes    STD (sexually transmitted disease)    Tx'd for chlamydia over 20 years ago   Trichomonas infection 06/12/2022   Past Surgical History:  Procedure Laterality Date   ASPIRATION BIOPSY     fluid around heart- negative results in 2000   BIOPSY  01/28/2021   Procedure: BIOPSY;  Surgeon: Quentin Ore, MD;  Location: [facility];  Service: General;;   ESOPHAGOGASTRODUODENOSCOPY N/A 01/28/2021   Procedure: ESOPHAGOGASTRODUODENOSCOPY (EGD);  Surgeon: Quentin Ore, MD;  Location: [facility];  Service: General;  Laterality: N/A;   fluid on heart  2000   chest tube placed   IUD removal under sedation     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   UPPER GI ENDOSCOPY N/A 03/04/2021   Procedure: UPPER GI ENDOSCOPY;  Surgeon: Quentin Ore, MD;  Location: [facility];  Service: General;  Laterality: N/A;   Patient Active Problem List   Diagnosis Date Noted   Anastomotic edema of gastrojejunostomy 03/08/2021   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/08/2021   Morbid obesity with BMI of 50.0-59.9, adult (HCC) 03/04/2021   Difficult intravenous access 01/28/2021   Orthopnea 12/17/2020   Snoring 12/17/2020   Binge eating disorder 09/25/2020    Metabolic syndrome 08/13/2020   Depression 08/13/2020   Family history of lung cancer    Family history of multiple myeloma    Vitamin D deficiency 05/29/2020   Family history of ovarian cancer 05/06/2020   Family history of breast cancer 05/06/2020   Class 3 severe obesity with serious comorbidity and body mass index (BMI) of 50.0 to 59.9 in adult [facility]) 05/25/2019   Prediabetes 03/20/2019   HTN (hypertension), benign 02/13/2019    PCP: Deatra James, MD  REFERRING PROVIDER: Deatra James, MD  THERAPY DIAG:  Muscle weakness  Pain in thoracic spine  Other low back pain  REFERRING DIAG: Hypertrophy of breast [N62]   Rationale for Evaluation and Treatment:  Rehabilitation  SUBJECTIVE:  PERTINENT PAST HISTORY:  none        PRECAUTIONS: None  WEIGHT BEARING RESTRICTIONS No  FALLS:  Has patient fallen in last 6 months? No, Number of falls: 0  MOI/History of condition:  Onset date: 4 years  SUBJECTIVE STATEMENT Patient states that she is hoping to get approval for surgery prior to school starting.    Pain:  Are you having pain? Yes Pain location: lower thoracic and upper lumbar spine NPRS scale:  0/10 to 9/10 Aggravating factors: sleeping, sitting in one position for a long period Relieving factors: movement and  exercise Pain description: aching Stage: Chronic Stability: staying the same 24 hour pattern: worse at night   Occupation: school teacher  Assistive Device: na  Hand Dominance: na  Patient Goals/Specific Activities: reduce pain, work on mobility   OBJECTIVE:    DIAGNOSTIC FINDINGS:  NA  GENERAL OBSERVATION:  Mammary hypertrophy, forward head, rounded shoulders     SENSATION:  Light touch: Appears intact   PALPATION: TTP T10 -L2  Throacic AROM  AROM AROM  eval  Flexion WNL  Extension WNL  Right lateral flexion WNL  Left lateral flexion WNL  Right rotation   Left rotation     (Blank rows = not tested)  LUMBAR AROM  AROM  AROM  eval  Flexion Fingertips to toes (WNL)  Extension WNL  Right lateral flexion WNL  Left lateral flexion WNL  Right rotation WNL  Left rotation WNL    (Blank rows = not tested)      UPPER EXTREMITY MMT:  MMT Right eval Left eval  Shoulder flexion    Shoulder abduction (C5)    Shoulder ER    Shoulder IR    Middle trapezius 4 4  Lower trapezius 3+ 3+  Shoulder extension    Grip strength    Cervical flexion (C1,C2)    Cervical S/B (C3)    Shoulder shrug (C4)    Elbow flexion (C6)    Elbow ext (C7)    Thumb ext (C8)    Finger abd (T1)    Grossly     (Blank rows = not tested, score listed is out of 5 possible points.  N = WNL, D = diminished, C = clear for gross weakness with myotome testing, * = concordant pain with testing)   JOINT MOBILITY TESTING:  WNL lumbar spine  Functional tests:   Supine single leg bridge: L 8'', R 7''   TODAY'S TREATMENT:  [facility] Adult PT Treatment:                                                DATE: 01/19/23 Therapeutic Exercise: Elliptical x 5 mins, level 1, incline 10  Pallof press 10# 2x10 BIL Prone quad stretch with strap, 3 x 30" BIL Seated BIL ER with scap retraction GTB 2x15 Seated Horizontal Abduction GTB 2 x 15  Standing hip abduction/extension GTB at ankles 2x10 each BIL Shoulder ext/pull downs two cables 10# 2x10 Rows with bar 17# 2x10   [facility] Adult PT Treatment:                                                DATE: 01/12/23 Therapeutic Exercise: Elliptical x 5 mins, level 1, incline 10  Pallof press 10# 2x10 BIL Supine LTR x 2'  Thomas stretch EOM with SKTC, x1' BIL  Prone quad stretch with strap, 3 x 30" BIL Seated BIL ER with scap retraction GTB 2x15 Seated Horizontal Abduction GTB 2 x 15  Standing hip abduction/extension GTB at ankles 2x10 each BIL Shoulder ext/pull downs two cables 10# 2x10   [facility] Adult PT Treatment:  DATE: 01/05/2023  Therapeutic  Exercise: Elliptical x 5 mins, level 1, incline 10  Supine LTR x 2'  Thomas stretch EOM with SKTC, 3 x 30" BIL  Prone quad stretch with strap, 3 x 30" BIL S/L thoracic rotation (open books), x 15 BIL  Seated BIL ER with scap retraction GTB 2x15 Seated Horizontal Abduction GTB 2 x 15  Standing hip abduction/extension GTB at ankles 2x10 each BIL Shoulder ext/pull downs, 2x20 Blue TB     PATIENT EDUCATION:  POC, diagnosis, prognosis, HEP.  Pt educated via explanation, demonstration, and handout (HEP).  Pt confirms understanding verbally.    HOME EXERCISE PROGRAM:  Access Code: [email or web address] URL: [email or web address] Date: 12/16/2022 Prepared by: Alphonzo Severance  Exercises - Sidelying Thoracic Rotation with Open Book  - 1 x daily - 7 x weekly - 1 sets - 20 reps - 2 hold - Standing Shoulder Row with Anchored Resistance  - 1 x daily - 7 x weekly - 3 sets - 10 reps - Hip Flexor Stretch at Edge of Bed  - 2 x daily - 7 x weekly - 2 sets - 45 second hold - Supine Bridge  - 1 x daily - 7 x weekly - 1 sets - 3 reps - 30 second hold     ASSESSMENT:  CLINICAL IMPRESSION: Patient presents to PT reporting no current pain. Session today continued to focus on periscapular, core, and proximal hip strengthening. Patient was able to tolerate all prescribed exercises with no adverse effects. Patient continues to benefit from skilled PT services and should be progressed as able to improve functional independence.    OBJECTIVE IMPAIRMENTS: Pain, tight hip flexors with concomitant increased lumbar lordosis, weak hip extensors and core, weak periscapular musculature  ACTIVITY LIMITATIONS: sleep, work, sitting  PERSONAL FACTORS: See medical history and pertinent history   REHAB POTENTIAL: Good  CLINICAL DECISION MAKING: Stable/uncomplicated  EVALUATION COMPLEXITY: Low   GOALS:  SHORT TERM GOALS: 01/13/2023  Kaniya will be >75% HEP compliant throughout therapy to improve  carryover between sessions and facilitate independent management of condition.  LONG TERM GOALS: 02/10/2023   Evelynne will self report >/= 50% decrease in pain from evaluation   Evaluation/Baseline: 9/10 max pain Goal status: INITIAL   2.  Graysen will improve the following MMTs to >/= 4+/5 to show improvement in strength:  mid and lower traps   Evaluation/Baseline: see chart in note Goal status: INITIAL  3.  Sacheen will report confidence in self management of condition at time of discharge with advanced HEP  Evaluation/Baseline: unable to self manage Goal status: INITIAL    PLAN: PT FREQUENCY: 1-2x/week  PT DURATION: 8 weeks  PLANNED INTERVENTIONS: Therapeutic exercises, Aquatic therapy, Therapeutic activity, Neuro Muscular re-education, Gait training, Patient/Family education, Joint mobilization, Dry Needling, Electrical stimulation, Spinal mobilization and/or manipulation, Moist heat, Taping, Vasopneumatic device, Ionotophoresis 4mg /ml Dexamethasone, and Manual therapy  PLAN FOR NEXT SESSION: Progressive throacic mobility along with postural muscle strengthening   Berta Minor PTA 01/19/2023, 10:38 AM

## 2023-01-20 ENCOUNTER — Ambulatory Visit: Payer: BC Managed Care – PPO | Admitting: Student

## 2023-01-26 ENCOUNTER — Ambulatory Visit: Payer: BC Managed Care – PPO

## 2023-01-26 DIAGNOSIS — M6281 Muscle weakness (generalized): Secondary | ICD-10-CM

## 2023-01-26 DIAGNOSIS — M5459 Other low back pain: Secondary | ICD-10-CM | POA: Diagnosis not present

## 2023-01-26 DIAGNOSIS — M546 Pain in thoracic spine: Secondary | ICD-10-CM

## 2023-01-26 NOTE — Therapy (Signed)
PHYSICAL THERAPY DISCHARGE SUMMARY  Visits from Start of Care: 7  Current functional level related to goals / functional outcomes: See objective findings/assessment    Remaining deficits: See objective findings/assessment    Education / Equipment: See treatment/assessment    Patient agrees to discharge. Patient goals were partially met. Patient is being discharged due to  completion of POC.   OUTPATIENT PHYSICAL THERAPY TREATMENT NOTE   Patient Name: Vanessa Wilcox MRN: [id number] DOB:03-07-1981, 42 y.o., female Today's Date: 01/26/2023   PT End of Session - 01/26/23 1002     Visit Number 7    Number of Visits 7    Date for PT Re-Evaluation 02/10/23    Authorization Type BCBS    PT Start Time 1002    PT Stop Time 1032    PT Time Calculation (min) 30 min    Activity Tolerance Patient tolerated treatment well;Patient limited by fatigue    Behavior During Therapy [facility] for tasks assessed/performed               Past Medical History:  Diagnosis Date   ADHD    Anxiety    Back pain    Depression    Family history of lung cancer    Family history of multiple myeloma    HTN (hypertension), benign 02/13/2019   Pre-diabetes    STD (sexually transmitted disease)    Tx'd for chlamydia over 20 years ago   Trichomonas infection 06/12/2022   Past Surgical History:  Procedure Laterality Date   ASPIRATION BIOPSY     fluid around heart- negative results in 2000   BIOPSY  01/28/2021   Procedure: BIOPSY;  Surgeon: Quentin Ore, MD;  Location: [facility];  Service: General;;   ESOPHAGOGASTRODUODENOSCOPY N/A 01/28/2021   Procedure: ESOPHAGOGASTRODUODENOSCOPY (EGD);  Surgeon: Quentin Ore, MD;  Location: [facility];  Service: General;  Laterality: N/A;   fluid on heart  2000   chest tube placed   IUD removal under sedation     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   [facility]   UPPER GI ENDOSCOPY N/A 03/04/2021   Procedure: UPPER GI ENDOSCOPY;   Surgeon: Quentin Ore, MD;  Location: [facility];  Service: General;  Laterality: N/A;   Patient Active Problem List   Diagnosis Date Noted   Anastomotic edema of gastrojejunostomy 03/08/2021   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/08/2021   Morbid obesity with BMI of 50.0-59.9, adult (HCC) 03/04/2021   Difficult intravenous access 01/28/2021   Orthopnea 12/17/2020   Snoring 12/17/2020   Binge eating disorder 09/25/2020   Metabolic syndrome 08/13/2020   Depression 08/13/2020   Family history of lung cancer    Family history of multiple myeloma    Vitamin D deficiency 05/29/2020   Family history of ovarian cancer 05/06/2020   Family history of breast cancer 05/06/2020   Class 3 severe obesity with serious comorbidity and body mass index (BMI) of 50.0 to 59.9 in adult [facility]) 05/25/2019   Prediabetes 03/20/2019   HTN (hypertension), benign 02/13/2019    PCP: Deatra James, MD  REFERRING PROVIDER: Deatra James, MD  THERAPY DIAG:  Muscle weakness  Pain in thoracic spine  Other low back pain  REFERRING DIAG: Hypertrophy of breast [N62]   Rationale for Evaluation and Treatment:  Rehabilitation  SUBJECTIVE:  PERTINENT PAST HISTORY:  none        PRECAUTIONS: None  WEIGHT BEARING RESTRICTIONS No  FALLS:  Has patient fallen in last 6 months? No, Number of  falls: 0  Onset date: 4 years  SUBJECTIVE STATEMENT Patient is ready for discharge from PT. She reports feeling that PT has been positive to teach her new machines, exercises, and stretches.    Pain:  Are you having pain? Yes Pain location: lower thoracic and upper lumbar spine NPRS scale:  0/10 to 9/10 Aggravating factors: sleeping, sitting in one position for a long period Relieving factors: movement and exercise Pain description: aching Stage: Chronic Stability: staying the same 24 hour pattern: worse at night   Occupation: school teacher  Assistive Device: na  Hand Dominance: na  Patient  Goals/Specific Activities: reduce pain, work on mobility   OBJECTIVE:    DIAGNOSTIC FINDINGS:  NA  GENERAL OBSERVATION:  Mammary hypertrophy, forward head, rounded shoulders     SENSATION:  Light touch: Appears intact   PALPATION: TTP T10 -L2  Throacic AROM  AROM AROM  eval  Flexion WNL  Extension WNL  Right lateral flexion WNL  Left lateral flexion WNL  Right rotation   Left rotation     (Blank rows = not tested)  LUMBAR AROM  AROM AROM  eval  Flexion Fingertips to toes (WNL)  Extension WNL  Right lateral flexion WNL  Left lateral flexion WNL  Right rotation WNL  Left rotation WNL    (Blank rows = not tested)      UPPER EXTREMITY MMT:  MMT Right eval Left eval Right 01/26/23 Left 01/26/23  Shoulder flexion      Shoulder abduction (C5)      Shoulder ER      Shoulder IR      Middle trapezius 4 4 4+ 4  Lower trapezius 3+ 3+ 4- 4  Shoulder extension      Grip strength      Cervical flexion (C1,C2)      Cervical S/B (C3)      Shoulder shrug (C4)      Elbow flexion (C6)      Elbow ext (C7)      Thumb ext (C8)      Finger abd (T1)      Grossly       (Blank rows = not tested, score listed is out of 5 possible points.  N = WNL, D = diminished, C = clear for gross weakness with myotome testing, * = concordant pain with testing)   JOINT MOBILITY TESTING:  WNL lumbar spine  Functional tests:   Supine single leg bridge: L 8'', R 7''   TODAY'S TREATMENT:   [facility] Adult PT Treatment:                                                DATE: 01/26/2023  Therapeutic Exercise: Elliptical x 5 mins, level 1, incline 10  Shoulder ext with black TB, 2 x 10  Shoulder row with black TB, 2 x 10  Pallof press with black TB x 10 each side  Seated scaption, 2 x 10 with 2# dumbbell  Seated abduction, 2 x 10 with 2# dumbbell   Therapeutic Activity:  Reassessment of objective measures and subjective assessment regarding progress towards established goals and plan  for independence with prescribed home program following discharged from PT   PATIENT EDUCATION:  POC, diagnosis, prognosis, HEP.  Pt educated via explanation, demonstration, and handout (HEP).  Pt confirms understanding verbally.    HOME EXERCISE  PROGRAM:  Access Code: [email or web address] URL: [email or web address] Date: 12/16/2022 Prepared by: Alphonzo Severance  Exercises - Sidelying Thoracic Rotation with Open Book  - 1 x daily - 7 x weekly - 1 sets - 20 reps - 2 hold - Standing Shoulder Row with Anchored Resistance  - 1 x daily - 7 x weekly - 3 sets - 10 reps - Hip Flexor Stretch at Edge of Bed  - 2 x daily - 7 x weekly - 2 sets - 45 second hold - Supine Bridge  - 1 x daily - 7 x weekly - 1 sets - 3 reps - 30 second hold     ASSESSMENT:  CLINICAL IMPRESSION:  Evamae has made some improvements in UE/periscapular strength since starting PT. She also reports decreased pain severity and has met most of her established rehab goals. Patient will be discharged from skilled PT at this time and is expected to remain independent with home program.     OBJECTIVE IMPAIRMENTS: Pain, tight hip flexors with concomitant increased lumbar lordosis, weak hip extensors and core, weak periscapular musculature  ACTIVITY LIMITATIONS: sleep, work, sitting  PERSONAL FACTORS: See medical history and pertinent history   REHAB POTENTIAL: Good  CLINICAL DECISION MAKING: Stable/uncomplicated  EVALUATION COMPLEXITY: Low   GOALS:  SHORT TERM GOALS: 01/13/2023  Kory will be >75% HEP compliant throughout therapy to improve carryover between sessions and facilitate independent management of condition. Goal Status: MET  LONG TERM GOALS: 02/10/2023   Beverley will self report >/= 50% decrease in pain from evaluation   Evaluation/Baseline: 9/10 max pain Goal status: MET 4/10    2.  Jamielynn will improve the following MMTs to >/= 4+/5 to show improvement in strength:  mid and lower traps    Evaluation/Baseline: see chart in note Goal status: NOT MET; see objective findings   3.  Lanna will report confidence in self management of condition at time of discharge with advanced HEP  Evaluation/Baseline: unable to self manage Goal status: MET    PLAN: PT FREQUENCY: 1-2x/week  PT DURATION: 8 weeks  PLANNED INTERVENTIONS: Therapeutic exercises, Aquatic therapy, Therapeutic activity, Neuro Muscular re-education, Gait training, Patient/Family education, Joint mobilization, Dry Needling, Electrical stimulation, Spinal mobilization and/or manipulation, Moist heat, Taping, Vasopneumatic device, Ionotophoresis 4mg /ml Dexamethasone, and Manual therapy    Mauri Reading PT, DPT 01/26/2023, 11:53 AM

## 2023-01-27 ENCOUNTER — Ambulatory Visit: Payer: BC Managed Care – PPO | Admitting: Student

## 2023-01-27 ENCOUNTER — Encounter: Payer: Self-pay | Admitting: Student

## 2023-01-27 VITALS — BP 111/77 | HR 73 | Ht 62.0 in | Wt 192.2 lb

## 2023-01-27 DIAGNOSIS — N62 Hypertrophy of breast: Secondary | ICD-10-CM

## 2023-01-27 DIAGNOSIS — M549 Dorsalgia, unspecified: Secondary | ICD-10-CM | POA: Diagnosis not present

## 2023-01-27 NOTE — Progress Notes (Signed)
   Referring Provider Deatra James, MD [id number] [place],  [place] [address]   CC:  Chief Complaint  Patient presents with   Follow-up      Vanessa Wilcox is an 42 y.o. female.  HPI: Patient is a 42 y.o. year old female here for follow up after completing physical therapy for pain related to macromastia.   She was seen for initial consult by Dr. Ladona Ridgel on 11/19/2022.  At that time, patient presented for consultation for breast reduction and panniculectomy.  Patient reported she had undergone gastric bypass surgery in 2022 and has lost approximately 100 pounds.  Patient stated that with her weight loss, she had a significant increase in the skin around the abdominal region which relates to multiple infections and skin rashes on the posterior aspect of the pannus in the intertriginous regions.  Patient also reported she was having extreme difficulty finding a bra that fits.  Patient also reported that the length of her breasts was interfering with her daily activities and exercises.  On exam, patient was noted to have pendulous breasts with grade 3 ptosis.  The STN on the right was 43 cm and the STN on the left was 44 cm.  Patient was found to be a good candidate for breast reduction.  Plan was for patient to consider waiting for the panniculectomy until she is healed from her breast reduction given the complexity of her breast reduction.  Patient was in agreement with that plan. Physical therapy was ordered for the patient.  Today, patient reports she is doing well.  She states that she completed 6 sessions of physical therapy.  She states that although she learned some valuable exercises and stretches from physical therapy, she is still experiencing back pain.  She states that she is also experiencing discomfort from her breasts on her abdomen.  She states that she is also having difficulty finding a bra that fits.  She denies any rashes underneath her breasts.  She denies any recent  changes in her health.  She denies any fevers or chills.  Patient states that she would still like to move forward with a breast reduction.   Review of Systems General: Denies any fevers or chills MSK: Endorses ongoing back and neck discomfort Skin: Denies rashes  Physical Exam    01/27/2023    1:56 PM 11/19/2022    8:05 AM 08/11/2022    1:56 PM  Vitals with BMI  Height 5\' 2"  5' 2.5"   Weight 192 lbs 3 oz 192 lbs 3 oz   BMI 35.14 34.57   Systolic 111 134 621  Diastolic 77 83 70  Pulse 73 66     General:  No acute distress,  Alert and oriented, Non-Toxic, Normal speech and affect Psych: Normal behavior and mood Respiratory: No increased WOB MSK: Ambulatory  Assessment/Plan  Patient is interested in pursuing surgical intervention for bilateral breast reduction. Patient has completed at least 6 weeks of physical therapy for pain related to macromastia.  Discussed with patient we would submit to insurance for authorization, discussed approval could take up to 6 weeks.   Laurena Spies 01/27/2023, 2:32 PM

## 2023-02-17 ENCOUNTER — Telehealth: Payer: Self-pay | Admitting: Plastic Surgery

## 2023-02-17 NOTE — Telephone Encounter (Signed)
Called patient let her know that insurance denied her surgery not taking enough breast tissue...Marland Kitchen per dr. Ladona Ridgel he wants to see her back in office for another exam

## 2023-03-04 ENCOUNTER — Ambulatory Visit: Payer: BC Managed Care – PPO | Admitting: Plastic Surgery

## 2023-03-17 ENCOUNTER — Encounter: Payer: Self-pay | Admitting: Plastic Surgery

## 2023-03-17 ENCOUNTER — Ambulatory Visit: Payer: BC Managed Care – PPO | Admitting: Plastic Surgery

## 2023-03-17 VITALS — BP 104/67 | HR 72 | Ht 62.0 in | Wt 192.8 lb

## 2023-03-17 DIAGNOSIS — N62 Hypertrophy of breast: Secondary | ICD-10-CM

## 2023-03-17 NOTE — Progress Notes (Signed)
Vanessa Wilcox returns today for reevaluation after she was denied by her insurance company for breast reduction.  We discussed at length the requirements for a breast reduction in her case.  On reevaluation I do believe that I can remove 575 g however she may be smaller than I would normally make breasts for breast reduction.  She understands this and feels that her her breasts are causing so much discomfort and interfere so significantly with her lifestyle that she is willing to accept a smaller breast to be able to undergo the reduction.   I will resubmit at her request.  She is a Chartered loss adjuster and has requested that we perform her surgery on a Friday preferably on November 22 so that she can take advantage of the Thanksgiving break.

## 2023-04-23 ENCOUNTER — Encounter: Payer: Self-pay | Admitting: Plastic Surgery

## 2023-05-12 ENCOUNTER — Ambulatory Visit (INDEPENDENT_AMBULATORY_CARE_PROVIDER_SITE_OTHER): Payer: BC Managed Care – PPO | Admitting: Student

## 2023-05-12 VITALS — BP 119/79 | HR 78

## 2023-05-12 DIAGNOSIS — N62 Hypertrophy of breast: Secondary | ICD-10-CM

## 2023-05-12 MED ORDER — OXYCODONE HCL 5 MG PO TABS: 5.0000 mg | ORAL_TABLET | Freq: Four times a day (QID) | ORAL | 0 refills | Status: DC | PRN

## 2023-05-12 MED ORDER — ONDANSETRON 4 MG PO TBDP: 4.0000 mg | ORAL_TABLET | Freq: Three times a day (TID) | ORAL | 0 refills | Status: DC | PRN

## 2023-05-12 NOTE — H&P (View-Only) (Signed)
Patient ID: Vanessa Wilcox, female    DOB: 04/21/1981, 42 y.o.   MRN: [id number]  Chief Complaint  Patient presents with   Pre-op Exam      ICD-10-CM   1. Hypertrophy of breast  N62        History of Present Illness: Vanessa Wilcox is a 42 y.o.  female  with a history of macromastia.  She presents for preoperative evaluation for upcoming procedure, Bilateral Breast Reduction, scheduled for 06/01/23 with Dr.  Ladona Ridgel  The patient has not had problems with anesthesia.  Patient reports she had a mammogram in February which was negative.  She states that she has a sister who has breast cancer, denies any other personal or family history of breast cancer.  She denies any history of cardiac disease.  She denies taking any blood thinners.  Patient reports she is not a smoker.  Patient denies taking any birth control or hormone replacement.  She denies any history of miscarriages.  She denies any personal family history of blood clots or clotting diseases.  She denies any recent traumas, surgeries, infections.  She denies any history of stroke or heart attack.  She denies any history of Crohn's disease or ulcerative colitis.  She denies any history of COPD or asthma.  She denies any history of cancer.  She denies any varicosities to her lower extremities.  She denies any recent fevers or chills or changes in her health.  Patient states that she is unsure of what her bra size is and she does not have a bra size in mind of what she wants to be.  She does states that she does not want to be "too small."  Summary of Previous Visit: She was seen for initial consult by Dr. Ladona Ridgel on 11/19/2022.  At that time, patient presented for consultation for breast reduction and panniculectomy.  Patient reported she had undergone gastric bypass surgery in 2022 and has lost approximately 100 pounds.  Patient stated that with her weight loss, she had a significant increase in the skin around the abdominal region which  relates to multiple infections and skin rashes on the posterior aspect of the pannus in the intertriginous regions.  Patient also reported she was having extreme difficulty finding a bra that fits.  Patient also reported that the length of her breasts was interfering with her daily activities and exercises.  On exam, patient was noted to have pendulous breasts with grade 3 ptosis.  The STN on the right was 43 cm and the STN on the left was 44 cm.  Patient was found to be a good candidate for breast reduction.  Plan was for patient to consider waiting for the panniculectomy until she is healed from her breast reduction given the complexity of her breast reduction.  Patient was in agreement with that plan. Physical therapy was ordered for the patient.   Estimated excess breast tissue to be removed at time of surgery: 575 grams  Job: Chartered loss adjuster, planning to take 2 weeks off  PMH Significant for: Hypertension, prediabetes, depression, symptomatic mammary hypertrophy  Patient states that she feels her breasts may have changed from now until the consult.  I did take updated photos today with the patient's permission and I will discuss with Dr. Ladona Ridgel.  Discussed with the patient she may have to come in for reevaluation.  Patient expressed understanding.   Past Medical History: Allergies: No Known Allergies  Current Medications:  Current Outpatient Medications:  ASHWAGANDHA PO, Take 2 tablets by mouth every evening., Disp: , Rfl:    cyclobenzaprine (FLEXERIL) 5 MG tablet, Take 5 mg by mouth at bedtime as needed., Disp: , Rfl:    Multiple Vitamin (MULTIVITAMIN PO), Take by mouth., Disp: , Rfl:    norethindrone (MICRONOR) 0.35 MG tablet, Take 1 tablet (0.35 mg total) by mouth daily., Disp: 84 tablet, Rfl: 3   ondansetron (ZOFRAN-ODT) 4 MG disintegrating tablet, Take 1 tablet (4 mg total) by mouth every 8 (eight) hours as needed for up to 20 doses for nausea or vomiting., Disp: 20 tablet, Rfl: 0    oxyCODONE (ROXICODONE) 5 MG immediate release tablet, Take 1 tablet (5 mg total) by mouth every 6 (six) hours as needed for up to 20 doses for severe pain (pain score 7-10)., Disp: 20 tablet, Rfl: 0   pantoprazole (PROTONIX) 40 MG tablet, Take 40 mg by mouth daily., Disp: , Rfl:    Vitamin D, Ergocalciferol, (DRISDOL) 1.25 MG (50000 UNIT) CAPS capsule, Take 1 capsule (50,000 Units total) by mouth every 7 (seven) days., Disp: 12 capsule, Rfl: 0  Past Medical Problems: Past Medical History:  Diagnosis Date   ADHD    Anxiety    Back pain    Depression    Family history of lung cancer    Family history of multiple myeloma    HTN (hypertension), benign 02/13/2019   Pre-diabetes    STD (sexually transmitted disease)    Tx'd for chlamydia over 20 years ago   Trichomonas infection 06/12/2022    Past Surgical History: Past Surgical History:  Procedure Laterality Date   ASPIRATION BIOPSY     fluid around heart- negative results in 2000   BIOPSY  01/28/2021   Procedure: BIOPSY;  Surgeon: Quentin Ore, MD;  Location: [facility];  Service: General;;   ESOPHAGOGASTRODUODENOSCOPY N/A 01/28/2021   Procedure: ESOPHAGOGASTRODUODENOSCOPY (EGD);  Surgeon: Quentin Ore, MD;  Location: [facility];  Service: General;  Laterality: N/A;   fluid on heart  2000   chest tube placed   IUD removal under sedation     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   [facility]   UPPER GI ENDOSCOPY N/A 03/04/2021   Procedure: UPPER GI ENDOSCOPY;  Surgeon: Quentin Ore, MD;  Location: [facility];  Service: General;  Laterality: N/A;    Social History: Social History   Socioeconomic History   Marital status: Married    Spouse name: Clifton Custard   Number of children: Not on file   Years of education: Not on file   Highest education level: Not on file  Occupational History   Occupation: Engineer, site  Tobacco Use   Smoking status: Never   Smokeless tobacco: Never  Vaping Use    Vaping status: Never Used  Substance and Sexual Activity   Alcohol use: Yes    Comment: socially   Drug use: No   Sexual activity: Yes    Partners: Male    Birth control/protection: Pill  Other Topics Concern   Not on file  Social History Narrative   Not on file   Social Determinants of Health   Financial Resource Strain: Not on file  Food Insecurity: Not on file  Transportation Needs: Not on file  Physical Activity: Not on file  Stress: Not on file  Social Connections: Not on file  Intimate Partner Violence: Not on file    Family History: Family History  Problem Relation Age of Onset   Diabetes Mother  High blood pressure Mother    Depression Mother    Sleep apnea Mother    Obesity Mother    Hypertension Mother    Hyperlipidemia Mother    Thyroid disease Mother    Lung cancer Mother 66   High Cholesterol Father    Cancer Father        unknown type - leg amputated   Alcoholism Father    Ovarian cancer Half-Sister 26       Dec ovarian Cancer   Breast cancer Half-Sister 66   Diabetes Maternal Grandmother    Stroke Maternal Grandfather    Multiple myeloma Cousin 40       in remission (maternal first cousin)    Review of Systems: Denies any fevers or chills  Physical Exam: Vital Signs BP 119/79 (BP Location: Right Arm, Patient Position: Sitting, Cuff Size: Large)   Pulse 78   SpO2 100%   Physical Exam  Constitutional:      General: Not in acute distress.    Appearance: Normal appearance. Not ill-appearing.  HENT:     Head: Normocephalic and atraumatic.  Neck:     Musculoskeletal: Normal range of motion.  Cardiovascular:     Rate and Rhythm: Normal rate Pulmonary:     Effort: Pulmonary effort is normal. No respiratory distress.  Musculoskeletal: Normal range of motion.  Skin:    General: Skin is warm and dry.     Findings: No erythema or rash.  Neurological:     Mental Status: Alert and oriented to person, place, and time. Mental status is at  baseline.  Psychiatric:        Mood and Affect: Mood normal.        Behavior: Behavior normal.    Assessment/Plan: The patient is scheduled for bilateral breast reduction with Dr. Ladona Ridgel.  Risks, benefits, and alternatives of procedure discussed, questions answered and consent obtained.    Smoking Status: Non-smoker; Counseling Given?  N/A Last Mammogram: 08/24/2022; Results: BI-RADS Category 1 negative  Caprini Score: 4; Risk Factors include: Age, BMI > 25, and length of planned surgery. Recommendation for mechanical prophylaxis. Encourage early ambulation.   Pictures obtained: @consult , updated photos taken today  Post-op Rx sent to pharmacy:  Zofran, oxycodone  Instructed patient to hold any multivitamins or supplements at least 1 week prior to surgery.  Recommended that she not take the Flexeril the day of surgery.  Patient expressed understanding.  Patient was provided with the breast reduction and General Surgical Risk consent document and Pain Medication Agreement prior to their appointment.  They had adequate time to read through the risk consent documents and Pain Medication Agreement. We also discussed them in person together during this preop appointment. All of their questions were answered to their satisfaction.  Recommended calling if they have any further questions.  Risk consent form and Pain Medication Agreement to be scanned into patient's chart.  The risk that can be encountered with breast reduction were discussed and include the following but not limited to these:  Breast asymmetry, fluid accumulation, firmness of the breast, inability to breast feed, loss of nipple or areola, skin loss, decrease or no nipple sensation, fat necrosis of the breast tissue, bleeding, infection, healing delay.  There are risks of anesthesia, changes to skin sensation and injury to nerves or blood vessels.  The muscle can be temporarily or permanently injured.  You may have an allergic reaction  to tape, suture, glue, blood products which can result in skin discoloration, swelling,  pain, skin lesions, poor healing.  Any of these can lead to the need for revisonal surgery or stage procedures.  A reduction has potential to interfere with diagnostic procedures.  Nipple or breast piercing can increase risks of infection.  This procedure is best done when the breast is fully developed.  Changes in the breast will continue to occur over time.  Pregnancy can alter the outcomes of previous breast reduction surgery, weight gain and weigh loss can also effect the long term appearance.   We discussed the possibility of amputation/free nipple graft technique due to the length of her STN.  She is understanding of the possibility that we would need to transition from a pedicle technique to a free nipple graft technique intraoperatively.  We discussed the risks associated with free nipple graft breast reductions, including but not limited to failure of the graft, partial loss of the graft, loss of sensation of bilateral nipple areola, complete loss of the nipple areola graft, inability to breast-feed, postoperative wounds, ongoing wound care.  We also discussed the risks associated with the pedicle technique.  We discussed that with the pedicle technique she could develop nipple areolar necrosis which would result in loss of the nipple, this would also result in ongoing wound care and possible changes in the shape of her breast.     Electronically signed by: Laurena Spies, PA-C 05/13/2023 9:27 AM

## 2023-05-12 NOTE — Progress Notes (Addendum)
Patient ID: Semone Scherer, female    DOB: [birth year], 42 y.o.   MRN: [id number]  Chief Complaint  Patient presents with   Pre-op Exam      ICD-10-CM   1. Hypertrophy of breast  N62        History of Present Illness: Ilyse Weilert is a 42 y.o.  female  with a history of macromastia.  She presents for preoperative evaluation for upcoming procedure, Bilateral Breast Reduction, scheduled for 06/01/23 with Dr.  Ladona Ridgel  The patient has not had problems with anesthesia.  Patient reports she had a mammogram in February which was negative.  She states that she has a sister who has breast cancer, denies any other personal or family history of breast cancer.  She denies any history of cardiac disease.  She denies taking any blood thinners.  Patient reports she is not a smoker.  Patient denies taking any birth control or hormone replacement.  She denies any history of miscarriages.  She denies any personal family history of blood clots or clotting diseases.  She denies any recent traumas, surgeries, infections.  She denies any history of stroke or heart attack.  She denies any history of Crohn's disease or ulcerative colitis.  She denies any history of COPD or asthma.  She denies any history of cancer.  She denies any varicosities to her lower extremities.  She denies any recent fevers or chills or changes in her health.  Patient states that she is unsure of what her bra size is and she does not have a bra size in mind of what she wants to be.  She does states that she does not want to be "too small."  Summary of Previous Visit: She was seen for initial consult by Dr. Ladona Ridgel on 11/19/2022.  At that time, patient presented for consultation for breast reduction and panniculectomy.  Patient reported she had undergone gastric bypass surgery in 2022 and has lost approximately 100 pounds.  Patient stated that with her weight loss, she had a significant increase in the skin around the abdominal region which  relates to multiple infections and skin rashes on the posterior aspect of the pannus in the intertriginous regions.  Patient also reported she was having extreme difficulty finding a bra that fits.  Patient also reported that the length of her breasts was interfering with her daily activities and exercises.  On exam, patient was noted to have pendulous breasts with grade 3 ptosis.  The STN on the right was 43 cm and the STN on the left was 44 cm.  Patient was found to be a good candidate for breast reduction.  Plan was for patient to consider waiting for the panniculectomy until she is healed from her breast reduction given the complexity of her breast reduction.  Patient was in agreement with that plan. Physical therapy was ordered for the patient.   Estimated excess breast tissue to be removed at time of surgery: 575 grams  Job: Chartered loss adjuster, planning to take 2 weeks off  PMH Significant for: Hypertension, prediabetes, depression, symptomatic mammary hypertrophy  Patient states that she feels her breasts may have changed from now until the consult.  I did take updated photos today with the patient's permission and I will discuss with Dr. Ladona Ridgel.  Discussed with the patient she may have to come in for reevaluation.  Patient expressed understanding.   Past Medical History: Allergies: No Known Allergies  Current Medications:  Current Outpatient Medications:  ASHWAGANDHA PO, Take 2 tablets by mouth every evening., Disp: , Rfl:    cyclobenzaprine (FLEXERIL) 5 MG tablet, Take 5 mg by mouth at bedtime as needed., Disp: , Rfl:    Multiple Vitamin (MULTIVITAMIN PO), Take by mouth., Disp: , Rfl:    norethindrone (MICRONOR) 0.35 MG tablet, Take 1 tablet (0.35 mg total) by mouth daily., Disp: 84 tablet, Rfl: 3   ondansetron (ZOFRAN-ODT) 4 MG disintegrating tablet, Take 1 tablet (4 mg total) by mouth every 8 (eight) hours as needed for up to 20 doses for nausea or vomiting., Disp: 20 tablet, Rfl: 0    oxyCODONE (ROXICODONE) 5 MG immediate release tablet, Take 1 tablet (5 mg total) by mouth every 6 (six) hours as needed for up to 20 doses for severe pain (pain score 7-10)., Disp: 20 tablet, Rfl: 0   pantoprazole (PROTONIX) 40 MG tablet, Take 40 mg by mouth daily., Disp: , Rfl:    Vitamin D, Ergocalciferol, (DRISDOL) 1.25 MG (50000 UNIT) CAPS capsule, Take 1 capsule (50,000 Units total) by mouth every 7 (seven) days., Disp: 12 capsule, Rfl: 0  Past Medical Problems: Past Medical History:  Diagnosis Date   ADHD    Anxiety    Back pain    Depression    Family history of lung cancer    Family history of multiple myeloma    HTN (hypertension), benign 02/13/2019   Pre-diabetes    STD (sexually transmitted disease)    Tx'd for chlamydia over 20 years ago   Trichomonas infection 06/12/2022    Past Surgical History: Past Surgical History:  Procedure Laterality Date   ASPIRATION BIOPSY     fluid around heart- negative results in 2000   BIOPSY  01/28/2021   Procedure: BIOPSY;  Surgeon: Quentin Ore, MD;  Location: [facility];  Service: General;;   ESOPHAGOGASTRODUODENOSCOPY N/A 01/28/2021   Procedure: ESOPHAGOGASTRODUODENOSCOPY (EGD);  Surgeon: Quentin Ore, MD;  Location: [facility];  Service: General;  Laterality: N/A;   fluid on heart  2000   chest tube placed   IUD removal under sedation     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   [facility]   UPPER GI ENDOSCOPY N/A 03/04/2021   Procedure: UPPER GI ENDOSCOPY;  Surgeon: Quentin Ore, MD;  Location: [facility];  Service: General;  Laterality: N/A;    Social History: Social History   Socioeconomic History   Marital status: Married    Spouse name: Clifton Custard   Number of children: Not on file   Years of education: Not on file   Highest education level: Not on file  Occupational History   Occupation: Engineer, site  Tobacco Use   Smoking status: Never   Smokeless tobacco: Never  Vaping Use    Vaping status: Never Used  Substance and Sexual Activity   Alcohol use: Yes    Comment: socially   Drug use: No   Sexual activity: Yes    Partners: Male    Birth control/protection: Pill  Other Topics Concern   Not on file  Social History Narrative   Not on file   Social Determinants of Health   Financial Resource Strain: Not on file  Food Insecurity: Not on file  Transportation Needs: Not on file  Physical Activity: Not on file  Stress: Not on file  Social Connections: Not on file  Intimate Partner Violence: Not on file    Family History: Family History  Problem Relation Age of Onset   Diabetes Mother  High blood pressure Mother    Depression Mother    Sleep apnea Mother    Obesity Mother    Hypertension Mother    Hyperlipidemia Mother    Thyroid disease Mother    Lung cancer Mother 79   High Cholesterol Father    Cancer Father        unknown type - leg amputated   Alcoholism Father    Ovarian cancer Half-Sister 47       Dec ovarian Cancer   Breast cancer Half-Sister 49   Diabetes Maternal Grandmother    Stroke Maternal Grandfather    Multiple myeloma Cousin 40       in remission (maternal first cousin)    Review of Systems: Denies any fevers or chills  Physical Exam: Vital Signs BP 119/79 (BP Location: Right Arm, Patient Position: Sitting, Cuff Size: Large)   Pulse 78   SpO2 100%   Physical Exam  Constitutional:      General: Not in acute distress.    Appearance: Normal appearance. Not ill-appearing.  HENT:     Head: Normocephalic and atraumatic.  Neck:     Musculoskeletal: Normal range of motion.  Cardiovascular:     Rate and Rhythm: Normal rate Pulmonary:     Effort: Pulmonary effort is normal. No respiratory distress.  Musculoskeletal: Normal range of motion.  Skin:    General: Skin is warm and dry.     Findings: No erythema or rash.  Neurological:     Mental Status: Alert and oriented to person, place, and time. Mental status is at  baseline.  Psychiatric:        Mood and Affect: Mood normal.        Behavior: Behavior normal.    Assessment/Plan: The patient is scheduled for bilateral breast reduction with Dr. Ladona Ridgel.  Risks, benefits, and alternatives of procedure discussed, questions answered and consent obtained.    Smoking Status: Non-smoker; Counseling Given?  N/A Last Mammogram: 08/24/2022; Results: BI-RADS Category 1 negative  Caprini Score: 4; Risk Factors include: Age, BMI > 25, and length of planned surgery. Recommendation for mechanical prophylaxis. Encourage early ambulation.   Pictures obtained: @consult , updated photos taken today  Post-op Rx sent to pharmacy:  Zofran, oxycodone  Instructed patient to hold any multivitamins or supplements at least 1 week prior to surgery.  Recommended that she not take the Flexeril the day of surgery.  Patient expressed understanding.  Patient was provided with the breast reduction and General Surgical Risk consent document and Pain Medication Agreement prior to their appointment.  They had adequate time to read through the risk consent documents and Pain Medication Agreement. We also discussed them in person together during this preop appointment. All of their questions were answered to their satisfaction.  Recommended calling if they have any further questions.  Risk consent form and Pain Medication Agreement to be scanned into patient's chart.  The risk that can be encountered with breast reduction were discussed and include the following but not limited to these:  Breast asymmetry, fluid accumulation, firmness of the breast, inability to breast feed, loss of nipple or areola, skin loss, decrease or no nipple sensation, fat necrosis of the breast tissue, bleeding, infection, healing delay.  There are risks of anesthesia, changes to skin sensation and injury to nerves or blood vessels.  The muscle can be temporarily or permanently injured.  You may have an allergic reaction  to tape, suture, glue, blood products which can result in skin discoloration, swelling,  pain, skin lesions, poor healing.  Any of these can lead to the need for revisonal surgery or stage procedures.  A reduction has potential to interfere with diagnostic procedures.  Nipple or breast piercing can increase risks of infection.  This procedure is best done when the breast is fully developed.  Changes in the breast will continue to occur over time.  Pregnancy can alter the outcomes of previous breast reduction surgery, weight gain and weigh loss can also effect the long term appearance.   We discussed the possibility of amputation/free nipple graft technique due to the length of her STN.  She is understanding of the possibility that we would need to transition from a pedicle technique to a free nipple graft technique intraoperatively.  We discussed the risks associated with free nipple graft breast reductions, including but not limited to failure of the graft, partial loss of the graft, loss of sensation of bilateral nipple areola, complete loss of the nipple areola graft, inability to breast-feed, postoperative wounds, ongoing wound care.  We also discussed the risks associated with the pedicle technique.  We discussed that with the pedicle technique she could develop nipple areolar necrosis which would result in loss of the nipple, this would also result in ongoing wound care and possible changes in the shape of her breast.     Electronically signed by: Laurena Spies, PA-C 05/13/2023 9:27 AM

## 2023-05-27 ENCOUNTER — Encounter (HOSPITAL_COMMUNITY): Payer: Self-pay | Admitting: Plastic Surgery

## 2023-05-27 ENCOUNTER — Other Ambulatory Visit: Payer: Self-pay

## 2023-05-27 ENCOUNTER — Other Ambulatory Visit: Payer: Self-pay | Admitting: Obstetrics and Gynecology

## 2023-05-27 NOTE — Progress Notes (Signed)
SDW CALL  Patient was given pre-op instructions over the phone. The opportunity was given for the patient to ask questions. No further questions asked. Patient verbalized understanding of instructions given.   PCP - Dr. Deatra James Cardiologist - denies  PPM/ICD - denies Device Orders - n/a Rep Notified - n/a  Chest x-ray - denies EKG - DOS Stress Test - denies ECHO - denies Cardiac Cath - denies  Sleep Study - OSA+ prior to weight loss surgery CPAP - denies  Fasting Blood Sugar - denies   Last dose of GLP1 agonist-  n/a GLP1 instructions: n/a  Blood Thinner Instructions: n/a Aspirin Instructions: n/a  ERAS Protcol - clears until 0415   COVID TEST-  n/a   Anesthesia review:  no  Patient denies shortness of breath, fever, cough and chest pain over the phone call   All instructions explained to the patient, with a verbal understanding of the material. Patient agrees to go over the instructions while at home for a better understanding.

## 2023-05-27 NOTE — Telephone Encounter (Signed)
Med refill request: [facility] Last AEX: 06/12/22, Last OV: 08/11/22 Next AEX: not scheduled Last MMG (if hormonal med) 08/24/22 Refill authorized: Please Advise, #[phone], 0 RF

## 2023-05-31 ENCOUNTER — Encounter: Payer: Self-pay | Admitting: Plastic Surgery

## 2023-05-31 NOTE — Anesthesia Preprocedure Evaluation (Signed)
Anesthesia Evaluation  Patient identified by MRN, date of birth, ID band Patient awake    Reviewed: Allergy & Precautions, H&P , NPO status , Patient's Chart, lab work & pertinent test results, reviewed documented beta blocker date and time   Airway Mallampati: III  TM Distance: >3 FB     Dental no notable dental hx.    Pulmonary sleep apnea    breath sounds clear to auscultation       Cardiovascular hypertension, + Orthopnea   Rhythm:Regular Rate:Normal     Neuro/Psych  PSYCHIATRIC DISORDERS Anxiety Depression    negative neurological ROS     GI/Hepatic Neg liver ROS,GERD  ,,Hx of gastric bypass   Endo/Other  negative endocrine ROS    Renal/GU negative Renal ROS  negative genitourinary   Musculoskeletal negative musculoskeletal ROS (+)    Abdominal   Peds negative pediatric ROS (+)  Hematology negative hematology ROS (+)   Anesthesia Other Findings   Reproductive/Obstetrics negative OB ROS                              Anesthesia Physical Anesthesia Plan  ASA: 2  Anesthesia Plan: General   Post-op Pain Management:    Induction: Intravenous  PONV Risk Score and Plan: 3 and Ondansetron, Dexamethasone, Midazolam and Treatment may vary due to age or medical condition  Airway Management Planned: Oral ETT  Additional Equipment:   Intra-op Plan:   Post-operative Plan: Extubation in OR  Informed Consent: I have reviewed the patients History and Physical, chart, labs and discussed the procedure including the risks, benefits and alternatives for the proposed anesthesia with the patient or authorized representative who has indicated his/her understanding and acceptance.     Dental advisory given  Plan Discussed with: CRNA  Anesthesia Plan Comments:        Anesthesia Quick Evaluation

## 2023-06-01 ENCOUNTER — Other Ambulatory Visit: Payer: Self-pay

## 2023-06-01 ENCOUNTER — Encounter (HOSPITAL_COMMUNITY)
Admission: RE | Disposition: A | Discharge status: Home / Self Care | Payer: Self-pay | Source: Home / Self Care | Attending: Plastic Surgery

## 2023-06-01 ENCOUNTER — Ambulatory Visit (HOSPITAL_COMMUNITY): Payer: BC Managed Care – PPO

## 2023-06-01 ENCOUNTER — Encounter (HOSPITAL_COMMUNITY): Payer: Self-pay | Admitting: Plastic Surgery

## 2023-06-01 ENCOUNTER — Ambulatory Visit (HOSPITAL_COMMUNITY): Payer: Self-pay

## 2023-06-01 ENCOUNTER — Ambulatory Visit (HOSPITAL_COMMUNITY)
Admission: RE | Admit: 2023-06-01 | Discharge: 2023-06-01 | Disposition: A | Discharge status: Home / Self Care | Payer: BC Managed Care – PPO | Attending: Plastic Surgery | Admitting: Plastic Surgery

## 2023-06-01 ENCOUNTER — Telehealth (INDEPENDENT_AMBULATORY_CARE_PROVIDER_SITE_OTHER): Payer: BC Managed Care – PPO | Admitting: Physician Assistant

## 2023-06-01 DIAGNOSIS — Z803 Family history of malignant neoplasm of breast: Secondary | ICD-10-CM | POA: Insufficient documentation

## 2023-06-01 DIAGNOSIS — N6031 Fibrosclerosis of right breast: Secondary | ICD-10-CM | POA: Diagnosis not present

## 2023-06-01 DIAGNOSIS — N6032 Fibrosclerosis of left breast: Secondary | ICD-10-CM | POA: Diagnosis not present

## 2023-06-01 DIAGNOSIS — Z9884 Bariatric surgery status: Secondary | ICD-10-CM | POA: Diagnosis not present

## 2023-06-01 DIAGNOSIS — I10 Essential (primary) hypertension: Secondary | ICD-10-CM | POA: Diagnosis not present

## 2023-06-01 DIAGNOSIS — Z9889 Other specified postprocedural states: Secondary | ICD-10-CM

## 2023-06-01 DIAGNOSIS — N62 Hypertrophy of breast: Secondary | ICD-10-CM | POA: Diagnosis present

## 2023-06-01 DIAGNOSIS — K219 Gastro-esophageal reflux disease without esophagitis: Secondary | ICD-10-CM | POA: Insufficient documentation

## 2023-06-01 HISTORY — DX: Gastro-esophageal reflux disease without esophagitis: K21.9

## 2023-06-01 HISTORY — PX: BREAST REDUCTION SURGERY: SHX8

## 2023-06-01 HISTORY — DX: Dyspnea, unspecified: R06.00

## 2023-06-01 HISTORY — DX: Sleep apnea, unspecified: G47.30

## 2023-06-01 LAB — CBC
HCT: 36 % (ref 36.0–46.0)
Hemoglobin: 11.2 g/dL — ABNORMAL LOW (ref 12.0–15.0)
MCH: 24 pg — ABNORMAL LOW (ref 26.0–34.0)
MCHC: 31.1 g/dL (ref 30.0–36.0)
MCV: 77.1 fL — ABNORMAL LOW (ref 80.0–100.0)
Platelets: 263 10*3/uL (ref 150–400)
RBC: 4.67 MIL/uL (ref 3.87–5.11)
RDW: 15.6 % — ABNORMAL HIGH (ref 11.5–15.5)
WBC: 6.7 10*3/uL (ref 4.0–10.5)
nRBC: 0 % (ref 0.0–0.2)

## 2023-06-01 LAB — BASIC METABOLIC PANEL
Anion gap: 7 (ref 5–15)
BUN: 13 mg/dL (ref 6–20)
CO2: 21 mmol/L — ABNORMAL LOW (ref 22–32)
Calcium: 9 mg/dL (ref 8.9–10.3)
Chloride: 108 mmol/L (ref 98–111)
Creatinine, Ser: 0.83 mg/dL (ref 0.44–1.00)
GFR, Estimated: >60 mL/min (ref >60)
Glucose, Bld: 83 mg/dL (ref 70–99)
Potassium: 3.9 mmol/L (ref 3.5–5.1)
Sodium: 136 mmol/L (ref 135–145)

## 2023-06-01 LAB — POCT PREGNANCY, URINE: Preg Test, Ur: NEGATIVE

## 2023-06-01 SURGERY — MAMMOPLASTY, REDUCTION
Anesthesia: General | Site: Breast | Laterality: Bilateral

## 2023-06-01 MED ORDER — SODIUM CHLORIDE (PF) 0.9 % IJ SOLN
INTRAMUSCULAR | Status: DC | PRN
  Administered 2023-06-01 (×2): 50 mL

## 2023-06-01 MED ORDER — BUPIVACAINE HCL (PF) 0.25 % IJ SOLN
INTRAMUSCULAR | Status: AC
Start: 2023-06-01 — End: ?
  Filled 2023-06-01: qty 30

## 2023-06-01 MED ORDER — SUCCINYLCHOLINE CHLORIDE 200 MG/10ML IV SOSY
PREFILLED_SYRINGE | INTRAVENOUS | Status: DC | PRN
  Administered 2023-06-01: 100 mg via INTRAVENOUS

## 2023-06-01 MED ORDER — CEFAZOLIN SODIUM-DEXTROSE 2-4 GM/100ML-% IV SOLN
2.0000 g | INTRAVENOUS | Status: AC
  Administered 2023-06-01: 2 g via INTRAVENOUS
  Filled 2023-06-01: qty 100

## 2023-06-01 MED ORDER — ACETAMINOPHEN 10 MG/ML IV SOLN: 1000.0000 mg | Freq: Once | INTRAVENOUS | Status: DC | PRN

## 2023-06-01 MED ORDER — FENTANYL CITRATE (PF) 100 MCG/2ML IJ SOLN
INTRAMUSCULAR | Status: AC
  Filled 2023-06-01: qty 2

## 2023-06-01 MED ORDER — DROPERIDOL 2.5 MG/ML IJ SOLN
0.6250 mg | Freq: Once | INTRAMUSCULAR | Status: AC | PRN
  Administered 2023-06-01: 0.625 mg via INTRAVENOUS

## 2023-06-01 MED ORDER — HYDROMORPHONE HCL 1 MG/ML IJ SOLN
INTRAMUSCULAR | Status: DC | PRN
  Administered 2023-06-01 (×2): .5 mg via INTRAVENOUS

## 2023-06-01 MED ORDER — HYDROMORPHONE HCL 1 MG/ML IJ SOLN
INTRAMUSCULAR | Status: AC
  Filled 2023-06-01: qty 0.5

## 2023-06-01 MED ORDER — ORAL CARE MOUTH RINSE: 15.0000 mL | Freq: Once | OROMUCOSAL | Status: AC

## 2023-06-01 MED ORDER — DEXAMETHASONE SODIUM PHOSPHATE 10 MG/ML IJ SOLN
INTRAMUSCULAR | Status: AC
  Filled 2023-06-01: qty 1

## 2023-06-01 MED ORDER — OXYCODONE HCL 5 MG/5ML PO SOLN: 5.0000 mg | Freq: Once | ORAL | Status: DC | PRN

## 2023-06-01 MED ORDER — PROPOFOL 10 MG/ML IV BOLUS
INTRAVENOUS | Status: DC | PRN
  Administered 2023-06-01: 150 mg via INTRAVENOUS

## 2023-06-01 MED ORDER — CHLORHEXIDINE GLUCONATE CLOTH 2 % EX PADS: 6.0000 | MEDICATED_PAD | Freq: Once | CUTANEOUS | Status: DC

## 2023-06-01 MED ORDER — FENTANYL CITRATE (PF) 250 MCG/5ML IJ SOLN
INTRAMUSCULAR | Status: DC | PRN
  Administered 2023-06-01 (×5): 50 ug via INTRAVENOUS

## 2023-06-01 MED ORDER — SODIUM CHLORIDE (PF) 0.9 % IJ SOLN
INTRAMUSCULAR | Status: AC
  Filled 2023-06-01: qty 50

## 2023-06-01 MED ORDER — PROPOFOL 10 MG/ML IV BOLUS
INTRAVENOUS | Status: AC
  Filled 2023-06-01: qty 20

## 2023-06-01 MED ORDER — DROPERIDOL 2.5 MG/ML IJ SOLN
INTRAMUSCULAR | Status: AC
  Filled 2023-06-01: qty 2

## 2023-06-01 MED ORDER — OXYCODONE HCL 5 MG PO TABS: 5.0000 mg | ORAL_TABLET | Freq: Once | ORAL | Status: DC | PRN

## 2023-06-01 MED ORDER — DEXAMETHASONE SODIUM PHOSPHATE 10 MG/ML IJ SOLN
INTRAMUSCULAR | Status: DC | PRN
  Administered 2023-06-01: 10 mg via INTRAVENOUS

## 2023-06-01 MED ORDER — KETOROLAC TROMETHAMINE 30 MG/ML IJ SOLN
INTRAMUSCULAR | Status: DC | PRN
  Administered 2023-06-01: 30 mg via INTRAVENOUS

## 2023-06-01 MED ORDER — SODIUM CHLORIDE (PF) 0.9 % IJ SOLN: INTRAMUSCULAR | Status: DC | PRN

## 2023-06-01 MED ORDER — FENTANYL CITRATE (PF) 250 MCG/5ML IJ SOLN
INTRAMUSCULAR | Status: AC
  Filled 2023-06-01: qty 5

## 2023-06-01 MED ORDER — MIDAZOLAM HCL 2 MG/2ML IJ SOLN
INTRAMUSCULAR | Status: DC | PRN
  Administered 2023-06-01: 2 mg via INTRAVENOUS

## 2023-06-01 MED ORDER — MIDAZOLAM HCL 2 MG/2ML IJ SOLN
INTRAMUSCULAR | Status: AC
Start: 2023-06-01 — End: ?
  Filled 2023-06-01: qty 2

## 2023-06-01 MED ORDER — ROCURONIUM BROMIDE 10 MG/ML (PF) SYRINGE
PREFILLED_SYRINGE | INTRAVENOUS | Status: DC | PRN
  Administered 2023-06-01: 10 mg via INTRAVENOUS

## 2023-06-01 MED ORDER — CHLORHEXIDINE GLUCONATE 0.12 % MT SOLN
15.0000 mL | Freq: Once | OROMUCOSAL | Status: AC
Start: 2023-06-01 — End: 2023-06-01
  Administered 2023-06-01: 15 mL via OROMUCOSAL
  Filled 2023-06-01: qty 15

## 2023-06-01 MED ORDER — LIDOCAINE 2% (20 MG/ML) 5 ML SYRINGE
INTRAMUSCULAR | Status: DC | PRN
  Administered 2023-06-01: 80 mg via INTRAVENOUS

## 2023-06-01 MED ORDER — LIDOCAINE 2% (20 MG/ML) 5 ML SYRINGE
INTRAMUSCULAR | Status: AC
  Filled 2023-06-01: qty 5

## 2023-06-01 MED ORDER — FENTANYL CITRATE (PF) 100 MCG/2ML IJ SOLN
25.0000 ug | INTRAMUSCULAR | Status: DC | PRN
  Administered 2023-06-01: 25 ug via INTRAVENOUS

## 2023-06-01 MED ORDER — ROCURONIUM BROMIDE 10 MG/ML (PF) SYRINGE
PREFILLED_SYRINGE | INTRAVENOUS | Status: AC
Start: 2023-06-01 — End: ?
  Filled 2023-06-01: qty 10

## 2023-06-01 MED ORDER — BUPIVACAINE LIPOSOME 1.3 % IJ SUSP
INTRAMUSCULAR | Status: AC
  Filled 2023-06-01: qty 20

## 2023-06-01 MED ORDER — LACTATED RINGERS IV SOLN: INTRAVENOUS | Status: DC

## 2023-06-01 MED ORDER — ONDANSETRON HCL 4 MG/2ML IJ SOLN
INTRAMUSCULAR | Status: DC | PRN
  Administered 2023-06-01: 4 mg via INTRAVENOUS

## 2023-06-01 MED ORDER — HEMOSTATIC AGENTS (NO CHARGE) OPTIME
TOPICAL | Status: DC | PRN
  Administered 2023-06-01 (×2): 1 via TOPICAL

## 2023-06-01 MED ORDER — 0.9 % SODIUM CHLORIDE (POUR BTL) OPTIME
TOPICAL | Status: DC | PRN
  Administered 2023-06-01 (×2): 1000 mL

## 2023-06-01 MED ORDER — SUCCINYLCHOLINE CHLORIDE 200 MG/10ML IV SOSY
PREFILLED_SYRINGE | INTRAVENOUS | Status: AC
  Filled 2023-06-01: qty 10

## 2023-06-01 SURGICAL SUPPLY — 52 items
BAG COUNTER SPONGE SURGICOUNT (BAG) IMPLANT
BAG DECANTER FOR FLEXI CONT (MISCELLANEOUS) IMPLANT
BENZOIN TINCTURE PRP APPL 2/3 (GAUZE/BANDAGES/DRESSINGS) IMPLANT
BINDER BREAST XLRG (GAUZE/BANDAGES/DRESSINGS) IMPLANT
BIOPATCH RED 1 DISK 7.0 (GAUZE/BANDAGES/DRESSINGS) ×2 IMPLANT
BLADE SURG 10 STRL SS (BLADE) ×4 IMPLANT
BNDG GAUZE DERMACEA FLUFF 4 (GAUZE/BANDAGES/DRESSINGS) IMPLANT
CHLORAPREP W/TINT 26 (MISCELLANEOUS) ×2 IMPLANT
DERMABOND ADVANCED .7 DNX12 (GAUZE/BANDAGES/DRESSINGS) ×2 IMPLANT
DRAIN CHANNEL 19F RND (DRAIN) ×2 IMPLANT
DRSG TEGADERM 4X4.75 (GAUZE/BANDAGES/DRESSINGS) ×2 IMPLANT
ELECT BLADE 4.0 EZ CLEAN MEGAD (MISCELLANEOUS) ×1
ELECT REM PT RETURN 9FT ADLT (ELECTROSURGICAL) ×2
ELECTRODE BLDE 4.0 EZ CLN MEGD (MISCELLANEOUS) IMPLANT
ELECTRODE REM PT RTRN 9FT ADLT (ELECTROSURGICAL) ×2 IMPLANT
EVACUATOR SILICONE 100CC (DRAIN) ×2 IMPLANT
GAUZE PAD ABD 8X10 STRL (GAUZE/BANDAGES/DRESSINGS) ×2 IMPLANT
GAUZE SPONGE 2X2 STRL 8-PLY (GAUZE/BANDAGES/DRESSINGS) IMPLANT
GAUZE SPONGE 4X4 12PLY STRL (GAUZE/BANDAGES/DRESSINGS) IMPLANT
GAUZE XEROFORM 5X9 LF (GAUZE/BANDAGES/DRESSINGS) IMPLANT
GLOVE BIO SURGEON STRL SZ8 (GLOVE) ×1 IMPLANT
GLOVE BIOGEL PI IND STRL 8 (GLOVE) ×1 IMPLANT
GOWN STRL REUS W/ TWL LRG LVL3 (GOWN DISPOSABLE) ×2 IMPLANT
GOWN STRL REUS W/ TWL XL LVL3 (GOWN DISPOSABLE) ×1 IMPLANT
HEMOSTAT ARISTA ABSORB 3G PWDR (HEMOSTASIS) IMPLANT
KIT BASIN OR (CUSTOM PROCEDURE TRAY) ×1 IMPLANT
MARKER SKIN DUAL TIP RULER LAB (MISCELLANEOUS) ×1 IMPLANT
NDL HYPO 22X1.5 SAFETY MO (MISCELLANEOUS) IMPLANT
NDL SPNL 18GX3.5 QUINCKE PK (NEEDLE) IMPLANT
NEEDLE HYPO 22X1.5 SAFETY MO (MISCELLANEOUS) ×2
NEEDLE SPNL 18GX3.5 QUINCKE PK (NEEDLE)
NS IRRIG 1000ML POUR BTL (IV SOLUTION) ×1 IMPLANT
PACK GENERAL/GYN (CUSTOM PROCEDURE TRAY) ×1 IMPLANT
PACK UNIVERSAL I (CUSTOM PROCEDURE TRAY) ×1 IMPLANT
PENCIL SMOKE EVACUATOR (MISCELLANEOUS) ×1 IMPLANT
PIN SAFETY STERILE (MISCELLANEOUS) IMPLANT
SHEET MEDIUM DRAPE 40X70 STRL (DRAPES) IMPLANT
SLEEVE SCD COMPRESS KNEE MED (STOCKING) ×1 IMPLANT
SPONGE T-LAP 18X18 ~~LOC~~+RFID (SPONGE) ×1 IMPLANT
STAPLER VISISTAT 35W (STAPLE) ×1 IMPLANT
STRIP CLOSURE SKIN 1/2X4 (GAUZE/BANDAGES/DRESSINGS) IMPLANT
SUT MNCRL AB 3-0 PS2 18 (SUTURE) IMPLANT
SUT MNCRL AB 4-0 PS2 18 (SUTURE) IMPLANT
SUT MON AB 2-0 CT1 36 (SUTURE) IMPLANT
SUT SILK 2 0 PERMA HAND 18 BK (SUTURE) ×2 IMPLANT
SUT VIC AB 3-0 PS1 18XBRD (SUTURE) IMPLANT
SYR 20ML LL LF (SYRINGE) IMPLANT
TAPE MEASURE VINYL STERILE (MISCELLANEOUS) IMPLANT
TOWEL GREEN STERILE FF (TOWEL DISPOSABLE) ×2 IMPLANT
TRAY FOLEY W/BAG SLVR 16FR ST (SET/KITS/TRAYS/PACK) IMPLANT
TUBING INFILTRATION IT-10001 (TUBING) IMPLANT
YANKAUER SUCT BULB TIP NO VENT (SUCTIONS) IMPLANT

## 2023-06-01 NOTE — Telephone Encounter (Signed)
Patient is s/p breast reduction performed earlier today by Dr. Ladona Ridgel who called into the on-call service with concerns about bleeding.  Patient tells me that she noticed dried bleeding on her ABD pads / binder that she hadn't seen earlier. She cannot visualize any active bleeding at time of conversation.  Drains are intact and functional, each holding suction. Emptied her bulbs while on the phone and afterwards the total output from today was 100 cc on the right and 80 cc on the left.  Describes the character as a "maroon", denies BRB.  Low suspicion for any active bleeding requiring hemostasis or surgical intervention. Replace gauze as needed.  She was in no distress on the phone.  Good family support to assist with needs overnight.  Follow-up in office tomorrow AM as scheduled.

## 2023-06-01 NOTE — Discharge Instructions (Addendum)
INSTRUCTIONS FOR AFTER BREAST SURGERY   You will likely have some questions about what to expect following your operation.  The following information will help you and your family understand what to expect when you are discharged from the hospital.  It is important to follow these guidelines to help ensure a smooth recovery and reduce complication.  Postoperative instructions include information on: diet, wound care, medications and physical activity.  AFTER SURGERY Expect to go home after the procedure.  In some cases, you may need to spend one night in the hospital for observation.  DIET Breast surgery does not require a specific diet.  However, the healthier you eat the better your body will heal. It is important to increasing your protein intake.  This means limiting the foods with sugar and carbohydrates.  Focus on vegetables and some meat.  If you have liposuction during your procedure be sure to drink water.  If your urine is bright yellow, then it is concentrated, and you need to drink more water.  As a general rule after surgery, you should have 8 ounces of water every hour while awake.  If you find you are persistently nauseated or unable to take in liquids let us know.  NO TOBACCO USE or EXPOSURE.  This will slow your healing process and lead to a wound.  WOUND CARE Leave the binder on at all times except when showering . Use fragrance free soap like Dial, Dove or [place].   After 24 hours you can remove the binder to shower. Once dry apply binder or sports bra. No baths, pools or hot tubs for four weeks. We close your incision to leave the smallest and best-looking scar. No ointment or creams on your incisions for four weeks.  No Neosporin (Too many skin reactions).  A few weeks after surgery you can use Mederma and start massaging the scar. We ask you to wear your binder or sports bra for the first 6 weeks around the clock, including while sleeping. This provides added comfort and helps  reduce the fluid accumulation at the surgery site. NO Ice or heating pads to the operative site.  You have a very high risk of a BURN before you feel the temperature change. Continue to empty, recharge, & record drainage from drains 2-3 times a day, as needed.  ACTIVITY No heavy lifting until cleared by the doctor.  This usually means no more than a half-gallon of milk.  It is OK to walk and climb stairs. Moving your legs is very important to decrease your risk of a blood clot.  It will also help keep you from getting deconditioned.  Every 1 to 2 hours get up and walk for 5 minutes. This will help with a quicker recovery back to normal.  Let pain be your guide so you don't do too much.  This time is for you to recover.  You will be more comfortable if you sleep and rest with your head elevated either with a few pillows under you or in a recliner.  No stomach sleeping for a three months.  WORK Everyone returns to work at different times. As a rough guide, most people take at least 1 - 2 weeks off prior to returning to work. If you need documentation for your job, give the forms to the front staff at the clinic.  DRIVING Arrange for someone to bring you home from the hospital after your surgery.  You may be able to drive a few days after  surgery but not while taking any narcotics or valium.  BOWEL MOVEMENTS Constipation can occur after anesthesia and while taking pain medication.  It is important to stay ahead for your comfort.  We recommend taking Milk of Magnesia (2 tablespoons; twice a day) while taking the pain pills.  MEDICATIONS You may be prescribed should start after surgery At your preoperative visit for you history and physical you may have been given the following medications: Zofran 4 mg:  This is to treat nausea and vomiting.  You can take this every 6 hours as needed and only if needed. Oxycodone 5 mg:  This is only to be used after you have taken the Motrin or the Tylenol. Every 8  hours as needed.   Over the counter Medication to take: Ibuprofen (Motrin) 600 mg:  Take this every 6 hours.  If you have additional pain then take 500 mg of the Tylenol every 8 hours.  Only take the Norco after you have tried these two. MiraLAX or Milk of Magnesia: Take this according to the bottle if you take the Norco.  WHEN TO CALL Call your surgeon's office if any of the following occur: Fever 101 degrees F or greater Excessive bleeding or fluid from the incision site. Pain that increases over time without aid from the medications Redness, warmth, or pus draining from incision sites Persistent nausea or inability to take in liquids Severe misshapen area that underwent the operation.  Here are some resources for breast cancer patients:  Plastic surgery website: https://www.plasticsurgery.org/for-medical-professionals/education-and-resources/publications/breast-reconstruction-magazine Breast Reconstruction Awareness Campaign:  [employer] Plastic surgery Implant information:  https://www.plasticsurgery.org/patient-safety/breast-implant-safety

## 2023-06-01 NOTE — Anesthesia Postprocedure Evaluation (Signed)
Anesthesia Post Note  Patient: Vanessa Wilcox  Procedure(s) Performed: MAMMARY REDUCTION  (BREAST) (Bilateral: Breast)     Patient location during evaluation: PACU Anesthesia Type: General Level of consciousness: awake and alert Pain management: pain level controlled Vital Signs Assessment: post-procedure vital signs reviewed and stable Respiratory status: spontaneous breathing, nonlabored ventilation, respiratory function stable and patient connected to nasal cannula oxygen Cardiovascular status: blood pressure returned to baseline and stable Postop Assessment: no apparent nausea or vomiting Anesthetic complications: no   No notable events documented.  Last Vitals:  Vitals:   06/01/23 1130 06/01/23 1145  BP: (!) 150/98 (!) 149/85  Pulse: 100 100  Resp: 18   Temp:  37.1 C  SpO2: 93% 93%    Last Pain:  Vitals:   06/01/23 1145  TempSrc:   PainSc: 0-No pain                 Mariann Barter

## 2023-06-01 NOTE — Interval H&P Note (Signed)
History and Physical Interval Note: No change in exam or indication for surgery. Long discussion regarding "cup size" and how no specific cup size can be guaranteed. In her case, it will be difficult to remove as much tissue as her insurance will require for a reduction. She states she understands and will be happy with any reduction that will improve her back pain. All questions answered. Marked with her assistance for a breast reduction.   06/01/2023 7:03 AM  Vanessa Wilcox  has presented today for surgery, with the diagnosis of Macromastia.  The various methods of treatment have been discussed with the patient and family. After consideration of risks, benefits and other options for treatment, the patient has consented to  Procedure(s): MAMMARY REDUCTION  (BREAST) (Bilateral) as a surgical intervention.  The patient's history has been reviewed, patient examined, no change in status, stable for surgery.  I have reviewed the patient's chart and labs.  Questions were answered to the patient's satisfaction.     Santiago Glad

## 2023-06-01 NOTE — Transfer of Care (Signed)
Immediate Anesthesia Transfer of Care Note  Patient: Minh Hagman  Procedure(s) Performed: MAMMARY REDUCTION  (BREAST) (Bilateral: Breast)  Patient Location: PACU  Anesthesia Type:General  Level of Consciousness: awake, alert , and oriented  Airway & Oxygen Therapy: Patient Spontanous Breathing and Patient connected to face mask oxygen  Post-op Assessment: Report given to RN and Post -op Vital signs reviewed and stable  Post vital signs: Reviewed and stable  Last Vitals:  Vitals Value Taken Time  BP 154/99 06/01/23 1050  Temp    Pulse 111 06/01/23 1053  Resp 14 06/01/23 1055  SpO2 100 % 06/01/23 1053  Vitals shown include unfiled device data.  Last Pain:  Vitals:   06/01/23 0617  TempSrc: Oral  PainSc:          Complications: No notable events documented.

## 2023-06-01 NOTE — Op Note (Signed)
DATE OF OPERATION: 06/01/2023  LOCATION: [facility] Main operating Room  PREOPERATIVE DIAGNOSIS: Symptomatic macromastia  POSTOPERATIVE DIAGNOSIS: Same  PROCEDURE: Bilateral breast reduction  SURGEON: Loren Racer, MD  ASSISTANT: Matt Scheeler, primary, Caroline More  EBL: 200 cc  CONDITION: Stable  COMPLICATIONS: None  INDICATION: The patient, Vanessa Wilcox, is a 42 y.o. female born on 08/29/1980, is here for treatment of upper back and neck pain secondary to large breast size.   PROCEDURE DETAILS:  The patient was seen prior to surgery and marked.   IV antibiotics were given. The patient was taken to the operating room and given a general anesthetic. A standard time out was performed and all information was confirmed by those in the room. SCDs were placed.   The chest was prepped and draped in usual sterile manner.  A 42 mm cookie cutter was used to outline the proposed nipple and areolar complexes and a 9 cm based pedicle was outlined on the breast.  The right breast was addressed first.  Laparotomy tape was placed at the base of breast as a tourniquet and the pedicle was de-epithelialized sharply.  The electrocautery was used to dissected the pedicle borders down to the chest wall.  The medial lateral and superior triangles of breast tissue were excised with the electrocautery.  This constituted the bulk of the reduction.  The tissue removed on the right side weighed 872 g.  The superior skin flap was developed however the patient requested that I leave her as big as possible so this flap was not thinned.  The wound was then irrigated with warm normal saline.  The pectoralis fascia and subcutaneous tissues were infiltrated with a mixture of Exparel, quarter percent plain Marcaine, and saline.  50 mL were injected into the right breast.  The surgical bed was coated with Arista hemostatic agent.  A 19 French round drain was placed behind the pedicle and brought out through a separate stab incision.   The T point was approximated with a single 2-0 Monocryl suture and the dermis was approximated with interrupted and running 3-0 Monocryl sutures.  The skin was closed with a running 4-0 Monocryl subcuticular stitch.  Attention was turned to the left side.  The pedicle was de-epithelialized sharply and dissected to the chest wall with the electric cautery.  The medial lateral and superior triangles of breast tissue were excised with electrocautery.  The total volume removed was 893 g.  The superior skin flap was elevated and again not thinned to provide as much volume as possible for the patient.  The surgical site was irrigated with saline and hemostasis achieved with the electrocautery.  The breast including the pectoral fascia was infiltrated with another 50 mL of the Exparel mixture.  The surgical bed was coated with Arista hemostatic agent.  A 19 French round drain was placed behind the pedicle and brought out through a separate stab incision.  Again the T point was approximated with a single 2-0 Monocryl suture.  Redundant skin on the medial aspect of the breast was excised.  The dermis was then closed with interrupted and running 3-0 Monocryl sutures and the skin was closed with a running 4-0 Monocryl subcuticular stitch.  All tissue was sent to pathology for routine examination.  The patient was placed in a supportive compressive garment and awakened from anesthesia without incident.  All instrument needle and sponge counts were reported as correct and no complications were noted.  The patient was awakened from anesthesia and transferred to  the recovery room in good condition. The patient was allowed to wake up and taken to recovery room in stable condition at the end of the case. The family was notified at the end of the case.   The advanced practice practitioner (APP) assisted throughout the case.  The APP was essential in retraction and counter traction when needed to make the case progress smoothly.   This retraction and assistance made it possible to see the tissue plans for the procedure.  The assistance was needed for blood control, tissue re-approximation and assisted with closure of the incision site.

## 2023-06-01 NOTE — Anesthesia Procedure Notes (Signed)
Procedure Name: Intubation Date/Time: 06/01/2023 7:25 AM  Performed by: April Holding, CRNAPre-anesthesia Checklist: Patient identified, Emergency Drugs available, Suction available and Patient being monitored Patient Re-evaluated:Patient Re-evaluated prior to induction Oxygen Delivery Method: Circle System Utilized Preoxygenation: Pre-oxygenation with 100% oxygen Induction Type: IV induction Ventilation: Mask ventilation without difficulty Laryngoscope Size: Miller and 2 Grade View: Grade I Tube type: Oral Tube size: 7.0 mm Number of attempts: 1 Airway Equipment and Method: Stylet and Oral airway Placement Confirmation: ETT inserted through vocal cords under direct vision, positive ETCO2 and breath sounds checked- equal and bilateral Secured at: 22 cm Tube secured with: Tape Dental Injury: Teeth and Oropharynx as per pre-operative assessment

## 2023-06-02 ENCOUNTER — Encounter: Payer: Self-pay | Admitting: Plastic Surgery

## 2023-06-02 ENCOUNTER — Encounter (HOSPITAL_COMMUNITY): Payer: Self-pay | Admitting: Plastic Surgery

## 2023-06-02 ENCOUNTER — Ambulatory Visit: Payer: BC Managed Care – PPO | Admitting: Student

## 2023-06-02 VITALS — BP 109/75 | HR 101 | Ht 62.0 in | Wt 193.0 lb

## 2023-06-02 DIAGNOSIS — Z9889 Other specified postprocedural states: Secondary | ICD-10-CM

## 2023-06-02 LAB — SURGICAL PATHOLOGY

## 2023-06-02 NOTE — Progress Notes (Signed)
Patient is a 42 year old female who underwent bilateral breast reduction with Dr. Ladona Ridgel yesterday 06/01/2023.  Intraoperatively, 872 g were removed from the right breast and 893 g removed from the left breast.  Patient presents to the clinic today for postoperative follow-up.  Per chart review, it appears patient called the on-call service last night with concerns about bleeding.  She stated that she noticed some dried bleeding on her ABD pad/binder that she had not had earlier.  Patient reported drains were intact and functionally holding suction.  There was low suspicion for active bleeding requiring hemostasis or surgical intervention.  Today, patient reports she is doing well.  She states that she did have some drainage from her incision overnight, but it has since improved.  She states that her pain has been well-controlled.  She denies any nausea or vomiting.  She denies any fevers or chills.  She states that she has been up and ambulating without any difficulties.  Chaperone present on exam.  On exam, patient is sitting upright in no acute distress.  Breasts are soft and symmetric.  There is some swelling bilaterally.  There is no overlying erythema or ecchymosis.  NAC's are healthy bilaterally.  Incisions are clean dry and intact.  JP drains are in place and functioning bilaterally.  Both of the bulbs have some normal-appearing bloody drainage in them.  JP drains were removed without any difficulty.  Patient tolerated well.  Instructed patient to continue compression at all times.  Recommended that she apply gauze and tape over her drain sites daily or as needed for saturation.  Discussed that she may have some incisional drainage, which she may apply an ABD pad or maxi pad to help with drainage.  Patient expressed understanding.  Patient to follow back up next week.  Instructed her to call in the meantime she is any questions or concerns about anything.

## 2023-06-02 NOTE — Addendum Note (Signed)
Addendum  created 06/02/23 0953 by Nicholson Nation, MD   Attestation recorded in Intraprocedure, Intraprocedure Attestations filed

## 2023-06-09 ENCOUNTER — Ambulatory Visit: Payer: BC Managed Care – PPO | Admitting: Plastic Surgery

## 2023-06-09 ENCOUNTER — Encounter: Payer: Self-pay | Admitting: Plastic Surgery

## 2023-06-09 VITALS — BP 126/79 | HR 75

## 2023-06-09 DIAGNOSIS — Z9889 Other specified postprocedural states: Secondary | ICD-10-CM

## 2023-06-09 NOTE — Progress Notes (Signed)
Vanessa Wilcox returns today 1 week postop from a bilateral breast reduction.  She is doing well and states she is only on Tylenol once a day now she is having increased sensation in the nipples bilaterally.  She has itching on the upper portion of the breast and below the inframammary fold incision.  On physical exam her incisions are all clean dry and intact and the nipples are warm and well-perfused.  She has a very nice early result from her reduction.  She is encouraged to start increasing activity slowly as tolerated.  She may begin scar massage next week.  She is encouraged to continue compressive supportive garments.  Follow-up on December 18.  Pathology reviewed no atypia no malignancy.

## 2023-06-23 ENCOUNTER — Encounter: Payer: BC Managed Care – PPO | Admitting: Student

## 2023-06-23 ENCOUNTER — Ambulatory Visit: Payer: BC Managed Care – PPO | Admitting: Surgical

## 2023-06-23 VITALS — BP 133/75 | HR 72

## 2023-06-23 DIAGNOSIS — Z9889 Other specified postprocedural states: Secondary | ICD-10-CM

## 2023-06-23 NOTE — Progress Notes (Signed)
Patient is a 42 year old female here for follow-up after bilateral breast reduction with Dr. Ladona Ridgel on 06/01/2023.  She was last seen in the office on 06/09/2023.  She is 3 weeks postop.  She reports she is doing really well, she is not having any specific issues or concerns at this time. She is overall very happy with her results.  Chaperone present on exam Bilateral NAC's are viable, bilateral breast incisions are intact. There is no erythema or cellulitic changes noted. No subcutaneous fluid collections noted with palpation. Bilateral breasts are symmetric. She does have some Dermabond and suture knots still present.  A/P:  Sutures were removed, patient tolerated this well.  Recommend continuing with light massage to breast incisions, continue with Vaseline and/or scar cream for optimal healing.  There is no signs of infection or concern on exam.  Recommend continuing with compressive garment 24/7 until 6 weeks post-op,  avoiding strenuous activity/heavy lifting until 6 weeks post-op  Recommend following up in 2 for reevaluation. All the patient's questions were answered to their content. Recommend calling with any questions or concerns.

## 2023-07-05 ENCOUNTER — Ambulatory Visit (INDEPENDENT_AMBULATORY_CARE_PROVIDER_SITE_OTHER): Payer: BC Managed Care – PPO | Admitting: Student

## 2023-07-05 DIAGNOSIS — Z9889 Other specified postprocedural states: Secondary | ICD-10-CM

## 2023-07-05 NOTE — Progress Notes (Cosign Needed)
Patient is a 42 year old female who underwent bilateral breast reduction with Dr. Ladona Ridgel on 06/01/2023.  She is almost 5 weeks postop.  She presents to the clinic today for postoperative follow-up.  Patient was last seen in the clinic on 06/23/2023.  At this visit, patient reported she was doing really well.  She was overall very happy with her results.  On exam, bilateral NAC's were viable, breast incisions were intact.  Sutures were removed.  Recommended that she continue with compressive garments and avoid heavy lifting.  Today, patient reports she is doing well.  She states that she feels her breasts are settling.  She denies any significant pain, fevers, chills.  She reports overall she has been doing well and is eager to get back to working out.  Chaperone present on exam.  On exam, patient is sitting upright in no acute distress.  Breasts are soft and symmetric.  There is no overlying erythema.  No obvious fluid collections palpated on exam.  NAC's are healthy bilaterally.  Incisions overall appear to be clean dry and intact and healing well.  There does appear to be some superficial scabbing/residual Dermabond noted throughout the incisions.  There does appear to be a little bit of wrinkling skin noted medially.  No signs of infection on exam.  Discussed with patient that I would like her to continue her compression and heavy lifting until she is 6 weeks postop.  I did discuss with her she can start gradually increasing her activities at this time.  Patient expressed understanding.  Recommended that she apply Vaseline to her incision for about another week or so, and then she may transition to scar creams.  We will have the patient follow back up in another month or so.  Instructed her to call in the meantime she has any questions or concerns about anything.  Pictures were obtained of the patient and placed in the chart with the patient's or guardian's permission.

## 2023-08-10 ENCOUNTER — Encounter: Payer: Self-pay | Admitting: Student

## 2023-08-10 ENCOUNTER — Ambulatory Visit: Payer: 59 | Admitting: Student

## 2023-08-10 VITALS — BP 132/84 | HR 87

## 2023-08-10 DIAGNOSIS — Z9889 Other specified postprocedural states: Secondary | ICD-10-CM

## 2023-08-10 MED ORDER — DOXYCYCLINE HYCLATE 100 MG PO TABS: 100.0000 mg | ORAL_TABLET | Freq: Two times a day (BID) | ORAL | 0 refills | Status: AC

## 2023-08-10 NOTE — Progress Notes (Signed)
 Patient is a 43 year old female who underwent bilateral breast reduction with Dr. Waddell on 06/01/2023.  She is a little over 2 months postop.  She presents to the clinic today for postoperative follow-up.     Patient was last seen in the clinic on 07/05/2023.  At this visit, patient was doing well.  On exam, NAC's were healthy bilaterally.  Incisions were overall clean dry and intact.  There was a little bit of wrinkling of the skin noted medially.  Plan is for patient to apply Vaseline to her incisions daily and then transition to scar creams.  Today, patient reports she is doing well.  She states that she noticed a little bit of firmness along her incision and also has a little bit of discomfort centrally.  She states that this discomfort improves after she applies Vaseline to the area.  She denies any fevers or chills.  She denies any drainage from her incisions.  She denies any other issues or concerns at this time.  Patient reports she is going on a cruise this Saturday.  Chaperone present on exam.  On exam, patient is sitting upright in no acute distress.  Breasts are soft and symmetric.  There is no overlying erythema.  No obvious fluid collections on exam.  NAC's appear to be healthy.  Incisions appear to be well-healed throughout.  There does appear to be some hypertrophic scarring versus the beginning of keloiding noted to some areas throughout the incisions.  To the right vertical limb incision, there appears to be some very mild swelling.  There is some mild tenderness to deep palpation.  There is no obvious fluctuance noted.  No active drainage.  The skin appears somewhat thin.  Discussed with the patient that the area to her right vertical limb incision may be the beginning of a small infection.  I recommended that she start an antibiotic to cover for this area and to keep a close eye on it.  Discussed with her that if it becomes more painful, becomes more swollen, she notices drainage or has  any worsening symptoms she needs to let us  know.  Patient expressed understanding.  I otherwise recommended that patient massage her incisions, and massage the skin medially.  Recommended that she use silicone based scar creams or tapes given history of keloiding.  Recommended she massage twice a day.  Patient expressed understanding.  Will have the patient follow back up on Friday for reevaluation before her trip of her right breast.  I instructed the patient to call in the meantime she has any questions or concerns about anything.  Pictures were obtained of the patient and placed in the chart with the patient's or guardian's permission.

## 2023-08-13 ENCOUNTER — Telehealth: Payer: Self-pay

## 2023-08-13 ENCOUNTER — Encounter: Payer: Self-pay | Admitting: Student

## 2023-08-13 ENCOUNTER — Ambulatory Visit (INDEPENDENT_AMBULATORY_CARE_PROVIDER_SITE_OTHER): Payer: 59 | Admitting: Student

## 2023-08-13 VITALS — BP 123/83 | HR 82 | Ht 62.0 in | Wt 184.2 lb

## 2023-08-13 DIAGNOSIS — Z9889 Other specified postprocedural states: Secondary | ICD-10-CM

## 2023-08-13 MED ORDER — DOXYCYCLINE HYCLATE 100 MG PO TABS: 100.0000 mg | ORAL_TABLET | Freq: Two times a day (BID) | ORAL | 0 refills | Status: AC

## 2023-08-13 NOTE — Telephone Encounter (Signed)
 Called patient to see if they can come in sooner and the patient verbalized she was at work and she doesn't get off until 3:00 p.m.  Patient did voiced if she can come in any sooner she will but expect to see her around 3:20 p.m.  I called to try to fill in the green gaps

## 2023-08-13 NOTE — Progress Notes (Signed)
 Patient is a 43 year old female who underwent bilateral breast reduction with Dr. Waddell on 06/01/2023.  She is a little over 2 months postop.  She presents to the clinic today for further follow-up on her right breast.  Patient was last seen in the clinic on 08/10/2023.  At this visit, patient was doing well.  On exam, her NAC's were healthy.  Incisions were well-healed throughout with some hypertrophic scarring versus beginning of keloiding to some areas.  To the right vertical limb incision, there was some very mild swelling as well as some mild tenderness to deep palpation.  There is no obvious fluctuance noted, no drainage.  Patient was started on an antibiotic and instructed to closely monitor the right breast.  Today, patient reports she is doing well.  She denies any changes since I saw her in the clinic last.  She denies any fevers or chills.  Denies any drainage.  Reports she has been taking her doxycycline  as prescribed.  Chaperone present on exam.  On exam, patient is sitting upright in no acute distress.  Breasts are soft and symmetric.  Vertical limb incision appears to be slightly improved from previous visit.  There is no erythema and swelling appears to have improved.  There is though still what appears to be a little bit of skin thinning.  There is no fluctuance.  No tenderness to palpation.  No drainage is noted.  There is no overlying erythema to either breast.  NAC's are healthy bilaterally.  Incisions are completely healed, there are some areas of hypertrophic scarring versus keloiding starting well.   Recommended that patient complete her course of antibiotics that was prescribed to her the other day.  I am going to go ahead and send her another 7 days of doxycycline  in the event she develops any redness or tenderness to her right breast.  Discussed with her that otherwise she does not have to take that course of doxycycline .  Discussed with patient to not drink any alcohol with the  doxycycline .  Patient expressed understanding.  Recommended continued massage of her scars in the areas of hypertrophic scarring.  Patient expressed understanding.  Patient to follow-up when she gets back from her trip.  I instructed her to call in the meantime she has any questions or concerns.  Pictures were obtained of the patient and placed in the chart with the patient's or guardian's permission.

## 2023-08-16 ENCOUNTER — Other Ambulatory Visit: Payer: Self-pay | Admitting: Obstetrics and Gynecology

## 2023-08-17 NOTE — Telephone Encounter (Signed)
Med refill request:Micronor 0.35mg  tablet  Last AEX: 06/13/22 last OV 08/11/22 Next AEX: not scheduled  Last MMG (if hormonal med) 08/24/22 birads cat 1 neg Refill authorized: Last rx 05/27/23 #[id number] refills please approve or deny as appropriate.

## 2023-08-20 ENCOUNTER — Telehealth: Payer: Self-pay | Admitting: Student

## 2023-08-20 NOTE — Telephone Encounter (Signed)
Called, but could not leave a VM

## 2023-08-23 ENCOUNTER — Encounter: Payer: Self-pay | Admitting: Student

## 2023-08-23 ENCOUNTER — Ambulatory Visit (INDEPENDENT_AMBULATORY_CARE_PROVIDER_SITE_OTHER): Payer: 59 | Admitting: Student

## 2023-08-23 VITALS — BP 130/84 | HR 72

## 2023-08-23 DIAGNOSIS — Z9889 Other specified postprocedural states: Secondary | ICD-10-CM

## 2023-08-23 NOTE — Progress Notes (Signed)
Patient is a 43 year old female who underwent bilateral breast reduction with Dr. Ladona Ridgel on 06/01/2023.  Patient is about 2 and half months postop.  She presents to the clinic today for further postoperative follow-up.  Patient was last seen in the clinic on 08/13/2023.  At this visit, patient was doing well.  On exam, breasts are soft and symmetric.  Vertical limb incision appears to be slightly improved from the previous visit.  There was no fluctuance or tenderness to palpation.  Incisions were completely healed, there were some areas of hypertrophic scarring versus keloiding.  Today, patient reports she is doing well.  She states that she did not need to take any doxycycline while she was on her cruise.  She denies any issue from her right breast while on her trip.  She denies any fevers or chills.  She denies any drainage.  She does state though that her incisions have been very itchy, especially at night.  She reports she has not yet started silicone mediated therapy.  Chaperone present on exam.  On exam, patient is sitting upright in no acute distress.  Breasts are soft and symmetric.  There is no overlying erythema.  No obvious fluid collections on exam.  Right vertical limb incision appears to be improving.  There is no erythema.  There does appear to be a little bit of dry skin.  There is no fluctuance.  No drainage on exam.  Incisions are well-healed, but there are some areas that look like they may be starting to keloid or areas of hypertrophic scarring.  There are no signs of infection on exam.  No rash noted on exam.  Discussed with the patient that she no longer has to take the doxycycline that was sent in.  Will go ahead and cancel the order.  Patient expressed understanding.  Discussed with her the itchiness may be from her starting to develop keloids.Recommended that patient continue with Vaseline along her incisions daily.  Discussed with her she may then transition to silicone-based  therapies such as tapes or creams.  I encouraged her to continue to massage.  I also discussed with her that she may take antihistamines such as Allegra, Zyrtec or Claritin during the day and Benadryl at night which may help with her itchiness.  Patient expressed understanding.  We also discussed that she may need steroid injections if keloiding does not improve with massage and silicone based therapies.  Will have her follow back up in about 6 weeks for reevaluation.  Patient expressed understanding.  Discussed with patient to continue to monitor her breast.  Discussed with her that if she has any concerns or questions she should call us sooner.  Patient expressed understanding.

## 2023-09-15 ENCOUNTER — Ambulatory Visit: Admitting: Plastic Surgery

## 2023-09-15 ENCOUNTER — Encounter: Payer: Self-pay | Admitting: Plastic Surgery

## 2023-09-15 DIAGNOSIS — L905 Scar conditions and fibrosis of skin: Secondary | ICD-10-CM | POA: Diagnosis not present

## 2023-09-15 DIAGNOSIS — Z9889 Other specified postprocedural states: Secondary | ICD-10-CM

## 2023-09-15 NOTE — Progress Notes (Signed)
 Vanessa Wilcox returns today for evaluation of her scars.  She has been using silicone tape over all of the incisions.  On examination the incisions look quite good with minimal hypertrophy.  There is no evidence of keloiding.  The vertical incision on the right breast is well-healed also with a very nice scar.  She will continue to use the silicone tape.  We discussed her potential panniculectomy or abdominoplasty.  She will let me know when she is ready to proceed as she will be an excellent candidate for this procedure as well.  Follow-up when ready to discuss abdominoplasty.

## 2023-10-04 ENCOUNTER — Encounter: Payer: 59 | Admitting: Plastic Surgery

## 2023-10-08 ENCOUNTER — Encounter (HOSPITAL_COMMUNITY): Payer: Self-pay | Admitting: *Deleted

## 2023-11-13 ENCOUNTER — Other Ambulatory Visit: Payer: Self-pay | Admitting: Obstetrics and Gynecology

## 2023-11-15 NOTE — Telephone Encounter (Signed)
 Medication refill request: Micronor   Last AEX:  06/12/22 Next AEX: not scheduled  Last MMG (if hormonal medication request): n/a Refill authorized: note sent to pharmacy that patient needs to schedule aex.

## 2023-11-30 ENCOUNTER — Other Ambulatory Visit: Payer: Self-pay | Admitting: Obstetrics and Gynecology

## 2023-11-30 NOTE — Telephone Encounter (Signed)
 Med refill request: Micronor   Last AEX: 06/12/22 Next AEX: 03/15/24 Last MMG (if hormonal med) 08/24/22 birads cat 1 neg  Refill authorized: Last rx 08/17/23 #84 with 0 refills. Please approve or deny

## 2024-02-24 ENCOUNTER — Other Ambulatory Visit: Payer: Self-pay | Admitting: Obstetrics and Gynecology

## 2024-02-24 NOTE — Telephone Encounter (Signed)
.  Med refill request: micronor   Last AEX:06/12/22 Next AEX:03/15/24 Last MMG (if hormonal med)08/24/22 Refill authorized: Please Advise?

## 2024-03-10 NOTE — Progress Notes (Unsigned)
 43 y.o. [id number] Separated African American female here for annual exam. Thinks she may be in perimenopause.    Periods space out to every 90 days when she is off her Micronor .  Now occur every 45- 50 days since she is back on Micronor .  Hot flashes every other night, much improved since immediately following her breast reduction surgery, when the high sweats were significant.   Wants vaginal/cervical STD screening.  Has vaginal irritation and would like vaginitis testing also.  Hx anemia.  Just had an infusion.   Has been going to the weight loss clinic.   Last pelvic US  07/30/22 due to FH ovarian cancer in sister. Normal findings.  PCP: Sun, Vyvyan, MD   Patient's last menstrual period was 03/12/2024 (exact date).     Period Cycle (Days):  (Every 6 weeks) Period Duration (Days): 4-5 Period Pattern: (!) Irregular (Every 6 weeks) Menstrual Flow: Heavy Menstrual Control: Maxi pad Dysmenorrhea: (!) Mild Dysmenorrhea Symptoms: Cramping     Sexually active: No  The current method of family planning is POP Menopausal hormone therapy:  n/a Exercising: Yes.    Weight training 3-5 week and cardio 1x week  Smoker:  no  OB History  Gravida Para Term Preterm AB Living  3 1   2 1   SAB IAB Ectopic Multiple Live Births   2       # Outcome Date GA Lbr Len/2nd Weight Sex Type Anes PTL Lv  3 IAB           2 IAB           1 Para              HEALTH MAINTENANCE: Last 2 paps:  07/18/19 neg HR HPV neg, 10/05/16 neg HPV neg History of abnormal Pap or positive HPV:  no Mammogram:   08/24/22 Breast Density Cat B, BIRADS Cat 1 neg  Colonoscopy:  NA Bone Density:  n/a  Result  n/a   Immunization History  Administered Date(s) Administered   PFIZER(Purple Top)SARS-COV-2 Vaccination 01/10/2020, 02/06/2020, 11/21/2020   PPD Test 10/09/2015      reports that she has never smoked. She has never used smokeless tobacco. She reports current alcohol use. She reports that she does not use  drugs.  Past Medical History:  Diagnosis Date   ADHD    Anxiety    Back pain    Depression    Dyspnea    with exertion   Family history of lung cancer    Family history of multiple myeloma    GERD (gastroesophageal reflux disease)    HTN (hypertension), benign 02/13/2019   Pre-diabetes    Sleep apnea    STD (sexually transmitted disease)    Tx'd for chlamydia over 20 years ago   Trichomonas infection 06/12/2022    Past Surgical History:  Procedure Laterality Date   ASPIRATION BIOPSY     fluid around heart- negative results in 2000   BIOPSY  01/28/2021   Procedure: BIOPSY;  Surgeon: Lyndel Deward PARAS, MD;  Location: [facility];  Service: General;;   BREAST REDUCTION SURGERY Bilateral 06/01/2023   Procedure: MAMMARY REDUCTION  (BREAST);  Surgeon: Waddell Leonce NOVAK, MD;  Location: [facility] OR;  Service: Plastics;  Laterality: Bilateral;   ESOPHAGOGASTRODUODENOSCOPY N/A 01/28/2021   Procedure: ESOPHAGOGASTRODUODENOSCOPY (EGD);  Surgeon: Lyndel Deward PARAS, MD;  Location: [facility];  Service: General;  Laterality: N/A;   fluid on heart  2000   chest tube placed   IUD removal  under sedation     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     lisdexamfetamine (VYVANSE ) 10 MG capsule Take 10 mg by mouth daily.     Multiple Vitamin (MULTIVITAMIN PO) Take 1 tablet by mouth daily. Bariatric vitamin     norethindrone  (MICRONOR ) 0.35 MG tablet Take 1 tablet (0.35 mg total) by mouth daily. Keep appointment to receive further refills. 28 tablet 0   nystatin cream (MYCOSTATIN) Apply 1 Application topically 2 (two)  times daily as needed (rash).     pantoprazole  (PROTONIX ) 40 MG tablet Take 40 mg by mouth daily as needed (heartburn).     Vitamin D , Ergocalciferol , (DRISDOL ) 1.25 MG (50000 UNIT) CAPS capsule Take 1 capsule (50,000 Units total) by mouth every 7 (seven) days. 12 capsule 0   No current facility-administered medications for this visit.    ALLERGIES: Patient has no known allergies.  Family History  Problem Relation Age of Onset   Diabetes Mother    High blood pressure Mother    Depression Mother    Sleep apnea Mother    Obesity Mother    Hypertension Mother    Hyperlipidemia Mother    Thyroid disease Mother    Lung cancer Mother 28   High Cholesterol Father    Cancer Father        unknown type - leg amputated   Alcoholism Father    Cancer Maternal Aunt        stomach cancer   Cancer Maternal Uncle        colon cancer   Diabetes Maternal Grandmother    Stroke Maternal Grandfather    Multiple myeloma Cousin 40       in remission (maternal first cousin)   Ovarian cancer Half-Sister 61       Dec ovarian Cancer   Breast cancer Half-Sister 23    Review of Systems  All other systems reviewed and are negative.   PHYSICAL EXAM:  BP 118/84 (BP Location: Left Arm, Patient Position: Sitting)   Pulse 78   Ht 5' 4 (1.626 m)   Wt 187 lb (84.8 kg)   LMP 03/12/2024 (Exact Date)   SpO2 100%   BMI 32.10 kg/m     General appearance: alert, cooperative and appears stated age Head: normocephalic, without obvious abnormality, atraumatic Neck: no adenopathy, supple, symmetrical, trachea midline and thyroid normal to inspection and palpation Lungs: clear to auscultation bilaterally Breasts: consistent with bilateral reduction, no masses or tenderness, No nipple retraction or dimpling, No nipple discharge or bleeding, No axillary adenopathy Heart: regular rate and rhythm Abdomen: soft, non-tender; no masses, no organomegaly Extremities: extremities normal, atraumatic, no cyanosis or  edema Skin: skin color, texture, turgor normal. No rashes or lesions Lymph nodes: cervical, supraclavicular, and axillary nodes normal. Neurologic: grossly normal  Pelvic: External genitalia:  no lesions              No abnormal inguinal nodes palpated.              Urethra:  normal appearing urethra with no masses, tenderness or lesions  Bartholins and Skenes: normal                 Vagina: normal appearing vagina with normal color and discharge, no lesions              Cervix: no lesions.  Menstrual flow.               Pap taken: no Bimanual Exam:  Uterus:  normal size, contour, position, consistency, mobility, non-tender              Adnexa: no mass, fullness, tenderness              Rectal exam: yes.  Confirms.              Anus:  normal sphincter tone, no lesions  Chaperone was present for exam:  Kari HERO, CMA  ASSESSMENT: Well woman visit with gynecologic exam. Status post breast reduction.  Hx left axillary mass. Sebaceous cyst on US . FH breast and ovarian cancer in sister.  Patient has had genetic counseling but not testing. Patient's lifetime risk of breast cancer is 10.7%. Uterine fibroids.  STD screening.  Vaginal irritation.  Status post roux-en-y surgery.   Successful weight loss.  PHQ-2-9: 0  PLAN: Mammogram screening discussed.  She will schedule.  Self breast awareness reviewed. Pap and HRV collected:  no.  Due in 2028.  Guidelines for Calcium, Vitamin D , regular exercise program including cardiovascular and weight bearing exercise. Medication refills:  Micronor  3 packs and 3 refills.  Nuswab for GC/CT/trich/BV/yeast.  She prefers oral medication if needed for vaginitis.  Declines serum STD screening.  Return for pelvic US  and follow up in January, 2025.  Follow up:  yearly for annual exam.

## 2024-03-15 ENCOUNTER — Encounter: Payer: Self-pay | Admitting: Obstetrics and Gynecology

## 2024-03-15 ENCOUNTER — Ambulatory Visit (INDEPENDENT_AMBULATORY_CARE_PROVIDER_SITE_OTHER): Payer: Self-pay | Admitting: Obstetrics and Gynecology

## 2024-03-15 ENCOUNTER — Other Ambulatory Visit (HOSPITAL_COMMUNITY)
Admission: RE | Admit: 2024-03-15 | Discharge: 2024-03-15 | Disposition: A | Discharge status: Home / Self Care | Source: Ambulatory Visit | Attending: Obstetrics and Gynecology | Admitting: Obstetrics and Gynecology

## 2024-03-15 VITALS — BP 118/84 | HR 78 | Ht 64.0 in | Wt 187.0 lb

## 2024-03-15 DIAGNOSIS — Z01419 Encounter for gynecological examination (general) (routine) without abnormal findings: Secondary | ICD-10-CM

## 2024-03-15 DIAGNOSIS — Z8041 Family history of malignant neoplasm of ovary: Secondary | ICD-10-CM | POA: Diagnosis not present

## 2024-03-15 DIAGNOSIS — Z113 Encounter for screening for infections with a predominantly sexual mode of transmission: Secondary | ICD-10-CM

## 2024-03-15 DIAGNOSIS — N898 Other specified noninflammatory disorders of vagina: Secondary | ICD-10-CM

## 2024-03-15 DIAGNOSIS — Z1331 Encounter for screening for depression: Secondary | ICD-10-CM

## 2024-03-15 MED ORDER — NORETHINDRONE 0.35 MG PO TABS: 1.0000 | ORAL_TABLET | Freq: Every day | ORAL | 3 refills | Status: DC

## 2024-03-15 NOTE — Patient Instructions (Signed)

## 2024-03-16 ENCOUNTER — Other Ambulatory Visit: Payer: Self-pay | Admitting: Obstetrics and Gynecology

## 2024-03-16 ENCOUNTER — Ambulatory Visit: Payer: Self-pay | Admitting: Obstetrics and Gynecology

## 2024-03-16 LAB — CERVICOVAGINAL ANCILLARY ONLY
Bacterial Vaginitis (gardnerella): POSITIVE — AB
Candida Glabrata: NEGATIVE
Candida Vaginitis: NEGATIVE
Chlamydia: NEGATIVE
Comment: NEGATIVE
Comment: NEGATIVE
Comment: NEGATIVE
Comment: NEGATIVE
Comment: NEGATIVE
Comment: NORMAL
Neisseria Gonorrhea: NEGATIVE
Trichomonas: NEGATIVE

## 2024-03-16 MED ORDER — METRONIDAZOLE 500 MG PO TABS: 500.0000 mg | ORAL_TABLET | Freq: Two times a day (BID) | ORAL | 0 refills | Status: DC

## 2024-04-23 ENCOUNTER — Encounter: Payer: Self-pay | Admitting: Obstetrics and Gynecology

## 2024-05-02 NOTE — Telephone Encounter (Signed)
Routing FYI.   Encounter closed.

## 2024-06-07 ENCOUNTER — Encounter: Payer: Self-pay | Admitting: Obstetrics and Gynecology

## 2024-06-07 ENCOUNTER — Ambulatory Visit: Admitting: Obstetrics and Gynecology

## 2024-06-07 VITALS — BP 118/80 | HR 71

## 2024-06-07 DIAGNOSIS — R5383 Other fatigue: Secondary | ICD-10-CM | POA: Diagnosis not present

## 2024-06-07 DIAGNOSIS — N951 Menopausal and female climacteric states: Secondary | ICD-10-CM

## 2024-06-07 DIAGNOSIS — L680 Hirsutism: Secondary | ICD-10-CM | POA: Diagnosis not present

## 2024-06-07 NOTE — Progress Notes (Unsigned)
 GYNECOLOGY  VISIT   HPI: 43 y.o.   Married  African American female   [phone] with Patient's last menstrual period was 05/17/2024 (exact date).   here for: Discuss getting HRT lab work. Stopped taking her POP and any vitamins to prepare for lab work.      Stopped her birth control on 05/06/24.  Has night sweats, hot flashes and fatigue.     Menses better for the last two months.   Came on time.   Flow lasts for 5 days, starts and stops.    Notes some increased hair growth on her chin.      Doing iron infusions.    Hgb 13.4 on 04/14/24.   GYNECOLOGIC HISTORY: Patient's last menstrual period was 05/17/2024 (exact date). Contraception:  POP Menopausal hormone therapy:  n/a Last 2 paps:  07/18/19 neg HR HPV neg, 10/05/16 neg HPV neg  History of abnormal Pap or positive HPV:  no Mammogram:  08/24/22 Breast Density Cat B, BIRADS Cat 1 neg         OB History     Gravida  3   Para  1   Term      Preterm      AB  2   Living  1      SAB      IAB  2   Ectopic      Multiple      Live Births                 Patient Active Problem List   Diagnosis Date Noted   Anastomotic edema of gastrojejunostomy 03/08/2021   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/08/2021   Morbid obesity with BMI of 50.0-59.9, adult (HCC) 03/04/2021   Difficult intravenous access 01/28/2021   Orthopnea 12/17/2020   Snoring 12/17/2020   Binge eating disorder 09/25/2020   Metabolic syndrome 08/13/2020   Depression 08/13/2020   Family history of lung cancer    Family history of multiple myeloma    Vitamin D  deficiency 05/29/2020   Family history of ovarian cancer 05/06/2020   Family history of breast cancer 05/06/2020   Class 3 severe obesity with serious comorbidity and body mass index (BMI) of 50.0 to 59.9 in adult [facility]) 05/25/2019   Prediabetes 03/20/2019   HTN (hypertension), benign 02/13/2019    Past Medical History:  Diagnosis Date   ADHD    Anxiety    Back pain     Depression    Dyspnea    with exertion   Family history of lung cancer    Family history of multiple myeloma    GERD (gastroesophageal reflux disease)    HTN (hypertension), benign 02/13/2019   Pre-diabetes    Sleep apnea    STD (sexually transmitted disease)    Tx'd for chlamydia over 20 years ago   Trichomonas infection 06/12/2022    Past Surgical History:  Procedure Laterality Date   ASPIRATION BIOPSY     fluid around heart- negative results in 2000   BIOPSY  01/28/2021   Procedure: BIOPSY;  Surgeon: Lyndel Deward PARAS, MD;  Location: [facility];  Service: General;;   BREAST REDUCTION SURGERY Bilateral 06/01/2023   Procedure: MAMMARY REDUCTION  (BREAST);  Surgeon: Waddell Leonce NOVAK, MD;  Location: [facility] OR;  Service: Plastics;  Laterality: Bilateral;   ESOPHAGOGASTRODUODENOSCOPY N/A 01/28/2021   Procedure: ESOPHAGOGASTRODUODENOSCOPY (EGD);  Surgeon: Lyndel Deward PARAS, MD;  Location: [facility];  Service: General;  Laterality: N/A;   fluid on heart  2000   chest tube placed   IUD removal under sedation     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   [facility]   UPPER GI ENDOSCOPY N/A 03/04/2021   Procedure: UPPER GI ENDOSCOPY;  Surgeon: Lyndel Deward PARAS, MD;  Location: [facility];  Service: General;  Laterality: N/A;    Current Outpatient Medications  Medication Sig Dispense Refill   acetaminophen  (TYLENOL ) 500 MG tablet Take 1,000 mg by mouth every 6 (six) hours as needed for moderate pain (pain score 4-6).     pantoprazole  (PROTONIX ) 40 MG tablet Take 40 mg by mouth daily as needed (heartburn).     lisdexamfetamine  (VYVANSE ) 10 MG capsule Take 10 mg by mouth daily. (Patient not taking: Reported on 06/07/2024)     Multiple Vitamin (MULTIVITAMIN PO) Take 1 tablet by mouth daily. Bariatric vitamin (Patient not taking: Reported on 06/07/2024)     norethindrone  (MICRONOR ) 0.35 MG tablet Take 1 tablet (0.35 mg total) by mouth daily. Keep appointment to receive further  refills. (Patient not taking: Reported on 06/07/2024) 84 tablet 3   Vitamin D , Ergocalciferol , (DRISDOL ) 1.25 MG (50000 UNIT) CAPS capsule Take 1 capsule (50,000 Units total) by mouth every 7 (seven) days. (Patient not taking: Reported on 06/07/2024) 12 capsule 0   No current facility-administered medications for this visit.     ALLERGIES: Patient has no known allergies.  Family History  Problem Relation Age of Onset   Diabetes Mother    High blood pressure Mother    Depression Mother    Sleep apnea Mother    Obesity Mother    Hypertension Mother    Hyperlipidemia Mother    Thyroid disease Mother    Lung cancer Mother 65   High Cholesterol Father    Cancer Father        unknown type - leg amputated   Alcoholism Father    Cancer Maternal Aunt        stomach cancer   Cancer Maternal Uncle        colon cancer   Diabetes Maternal Grandmother    Stroke Maternal Grandfather    Multiple myeloma Cousin 40       in remission (maternal first cousin)   Ovarian cancer Half-Sister 8       Dec ovarian Cancer   Breast cancer Half-Sister 23    Social History   Socioeconomic History   Marital status: Married    Spouse name: Beverley   Number of children: Not on file   Years of education: Not on file   Highest education level: Not on file  Occupational History   Occupation: Engineer, Site  Tobacco Use   Smoking status: Never   Smokeless tobacco: Never  Vaping Use   Vaping status: Never Used  Substance and Sexual Activity   Alcohol use: Yes    Comment: socially   Drug use: No   Sexual activity: Not Currently    Partners: Male    Birth control/protection: Pill  Other Topics Concern   Not on file  Social History Narrative   Not on file   Social Drivers of Health   Financial Resource Strain: Low Risk  (04/14/2024)   Received from [facility]   Overall Financial Resource Strain (CARDIA)    How hard is it for you to pay for the very basics like food, housing, medical care,  and heating?: Not hard at all  Food Insecurity: No Food Insecurity (04/14/2024)   [facility]  Hunger Vital Sign    Within the past 12 months, you worried that your food would run out before you got the money to buy more.: Never true    Within the past 12 months, the food you bought just didn't last and you didn't have money to get more.: Never true  Transportation Needs: No Transportation Needs (04/14/2024)   Received from [facility] - Transportation    In the past 12 months, has lack of transportation kept you from medical appointments or from getting medications?: No    In the past 12 months, has lack of transportation kept you from meetings, work, or from getting things needed for daily living?: No  Physical Activity: Not on file  Stress: Not on file  Social Connections: Not on file  Intimate Partner Violence: Not on file    Review of Systems  All other systems reviewed and are negative.   PHYSICAL EXAMINATION:   BP 118/80 (BP Location: Right Arm, Patient Position: Sitting)   Pulse 71   LMP 05/17/2024 (Exact Date)   SpO2 100%     General appearance: alert, cooperative and appears stated age   ASSESSMENT:  Menopausal symptoms.  Fatigue. Increased facial hair.  Has discontinued her Micronor  1 month ago.   FH breast and ovarian cancer in 1/2 sister.   Patient has had genetic counseling but not testing. Patient's lifetime risk of breast cancer is 10.7%.   PLAN:  FSH, ultrasensitive estradiol , testosterone, TSH.  Pelvic US  in January and then follow up.  Will discuss her hormone results and treatment options at her pelvic ultrasound follow up appointment.    21 min  total time was spent for this patient encounter, including preparation, face-to-face counseling with the patient, coordination of care, and documentation of the encounter.

## 2024-06-12 ENCOUNTER — Ambulatory Visit: Payer: Self-pay | Admitting: Obstetrics and Gynecology

## 2024-06-12 LAB — TESTOS,TOTAL,FREE AND SHBG (FEMALE)
Free Testosterone: 3 pg/mL (ref 0.1–6.4)
Sex Hormone Binding: 62 nmol/L (ref 17–124)
Testosterone, Total, LC-MS-MS: 28 ng/dL (ref 2–45)

## 2024-06-12 LAB — FOLLICLE STIMULATING HORMONE: FSH: 4.5 m[IU]/mL

## 2024-06-12 LAB — TSH: TSH: 1.05 m[IU]/L

## 2024-06-12 LAB — ESTRADIOL, ULTRA SENS: Estradiol, Ultra Sensitive: 202 pg/mL

## 2024-07-11 ENCOUNTER — Encounter: Payer: Self-pay | Admitting: Obstetrics and Gynecology

## 2024-07-11 DIAGNOSIS — N951 Menopausal and female climacteric states: Secondary | ICD-10-CM

## 2024-07-13 ENCOUNTER — Encounter: Payer: Self-pay | Admitting: Obstetrics and Gynecology

## 2024-07-13 ENCOUNTER — Ambulatory Visit (INDEPENDENT_AMBULATORY_CARE_PROVIDER_SITE_OTHER)

## 2024-07-13 ENCOUNTER — Other Ambulatory Visit (HOSPITAL_COMMUNITY): Payer: Self-pay

## 2024-07-13 DIAGNOSIS — Z8041 Family history of malignant neoplasm of ovary: Secondary | ICD-10-CM | POA: Diagnosis not present

## 2024-07-17 ENCOUNTER — Ambulatory Visit: Admitting: Obstetrics and Gynecology

## 2024-07-20 NOTE — Progress Notes (Signed)
 "  GYNECOLOGY  VISIT   HPI: 44 y.o.   Married  African American female   [phone] with No LMP recorded. (Menstrual status: Oral contraceptives).   here for: U/S consult    for family hx of ovarian cancer.   Normal FSH and estradiol  and testosterone. Hot flashes are not as bad at night.   Restarted Micronor .  GYNECOLOGIC HISTORY: No LMP recorded. (Menstrual status: Oral contraceptives). Contraception:  None Menopausal hormone therapy:  n/a Last 2 paps:  07/18/19 neg HR HPV neg, 10/05/16 neg HPV neg  History of abnormal Pap or positive HPV:  no Mammogram:   08/24/22 Breast Density Cat B, BIRADS Cat 1 neg         OB History     Gravida  3   Para  1   Term      Preterm      AB  2   Living  1      SAB      IAB  2   Ectopic      Multiple      Live Births                 Patient Active Problem List   Diagnosis Date Noted   Anastomotic edema of gastrojejunostomy 03/08/2021   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/08/2021   Morbid obesity with BMI of 50.0-59.9, adult (HCC) 03/04/2021   Difficult intravenous access 01/28/2021   Orthopnea 12/17/2020   Snoring 12/17/2020   Binge eating disorder 09/25/2020   Metabolic syndrome 08/13/2020   Depression 08/13/2020   Family history of lung cancer    Family history of multiple myeloma    Vitamin D  deficiency 05/29/2020   Family history of ovarian cancer 05/06/2020   Family history of breast cancer 05/06/2020   Class 3 severe obesity with serious comorbidity and body mass index (BMI) of 50.0 to 59.9 in adult [facility]) 05/25/2019   Prediabetes 03/20/2019   HTN (hypertension), benign 02/13/2019    Past Medical History:  Diagnosis Date   ADHD    Anxiety    Back pain    Depression    Dyspnea    with exertion   Family history of lung cancer    Family history of multiple myeloma    GERD (gastroesophageal reflux disease)    HTN (hypertension), benign 02/13/2019   Pre-diabetes    Sleep apnea    STD (sexually  transmitted disease)    Tx'd for chlamydia over 20 years ago   Trichomonas infection 06/12/2022    Past Surgical History:  Procedure Laterality Date   ASPIRATION BIOPSY     fluid around heart- negative results in 2000   BIOPSY  01/28/2021   Procedure: BIOPSY;  Surgeon: Lyndel Deward PARAS, MD;  [facility]: [facility];  Service: General;;   BREAST REDUCTION SURGERY Bilateral 06/01/2023   Procedure: MAMMARY REDUCTION  (BREAST);  Surgeon: Waddell Leonce NOVAK, MD;  Location: [facility] OR;  Service: Plastics;  Laterality: Bilateral;   ESOPHAGOGASTRODUODENOSCOPY N/A 01/28/2021   Procedure: ESOPHAGOGASTRODUODENOSCOPY (EGD);  Surgeon: Lyndel Deward PARAS, MD;  Location: [facility];  Service: General;  Laterality: N/A;   fluid on heart  2000   chest tube placed   IUD removal under sedation     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   [facility]   UPPER GI ENDOSCOPY N/A 03/04/2021   Procedure: UPPER GI ENDOSCOPY;  Surgeon: Lyndel Deward PARAS, MD;  Location: [facility];  Service: General;  Laterality: N/A;  Current Outpatient Medications  Medication Sig Dispense Refill   acetaminophen  (TYLENOL ) 500 MG tablet Take 1,000 mg by mouth every 6 (six) hours as needed for moderate pain (pain score 4-6).     pantoprazole  (PROTONIX ) 40 MG tablet Take 40 mg by mouth daily as needed (heartburn).     lisdexamfetamine  (VYVANSE ) 10 MG capsule Take 10 mg by mouth daily. (Patient not taking: Reported on 07/21/2024)     Multiple Vitamin (MULTIVITAMIN PO) Take 1 tablet by mouth daily. Bariatric vitamin (Patient not taking: Reported on 07/21/2024)     norethindrone  (MICRONOR ) 0.35 MG tablet Take 1 tablet (0.35 mg total) by mouth daily. Keep appointment to receive further refills. 84 tablet 2   Vitamin D , Ergocalciferol , (DRISDOL ) 1.25 MG (50000 UNIT) CAPS capsule Take 1 capsule (50,000 Units total) by mouth every 7 (seven) days. (Patient not taking: Reported on 07/21/2024) 12 capsule 0   No current  facility-administered medications for this visit.     ALLERGIES: Patient has no known allergies.  Family History  Problem Relation Age of Onset   Diabetes Mother    High blood pressure Mother    Depression Mother    Sleep apnea Mother    Obesity Mother    Hypertension Mother    Hyperlipidemia Mother    Thyroid disease Mother    Lung cancer Mother 13   High Cholesterol Father    Cancer Father        unknown type - leg amputated   Alcoholism Father    Cancer Maternal Aunt        stomach cancer   Cancer Maternal Uncle        colon cancer   Diabetes Maternal Grandmother    Stroke Maternal Grandfather    Multiple myeloma Cousin 40       in remission (maternal first cousin)   Ovarian cancer Half-Sister 54       Dec ovarian Cancer   Breast cancer Half-Sister 4    Social History   Socioeconomic History   Marital status: Married    Spouse name: Beverley   Number of children: Not on file   Years of education: Not on file   Highest education level: Not on file  Occupational History   Occupation: Engineer, Site  Tobacco Use   Smoking status: Never   Smokeless tobacco: Never  Vaping Use   Vaping status: Never Used  Substance and Sexual Activity   Alcohol use: Yes    Comment: socially   Drug use: No   Sexual activity: Not Currently    Partners: Male    Birth control/protection: Pill  Other Topics Concern   Not on file  Social History Narrative   Not on file   Social Drivers of Health   Tobacco Use: Low Risk (07/21/2024)   Patient History    Smoking Tobacco Use: Never    Smokeless Tobacco Use: Never    Passive Exposure: Not on file  Financial Resource Strain: Low Risk (04/14/2024)   Received from [facility]   Overall Financial Resource Strain (CARDIA)    How hard is it for you to pay for the very basics like food, housing, medical care, and heating?: Not hard at all  Food Insecurity: No Food Insecurity (04/14/2024)   Received from [facility]   Epic     Within the past 12 months, you worried that your food would run out before you got the money to buy more.: Never true    Within the past  12 months, the food you bought just didn't last and you didn't have money to get more.: Never true  Transportation Needs: No Transportation Needs (04/14/2024)   Received from [facility]    In the past 12 months, has lack of transportation kept you from medical appointments or from getting medications?: No    In the past 12 months, has lack of transportation kept you from meetings, work, or from getting things needed for daily living?: No  Physical Activity: Not on file  Stress: Not on file  Social Connections: Not on file  Intimate Partner Violence: Not on file  Depression (PHQ2-9): Low Risk (03/15/2024)   Depression (PHQ2-9)    PHQ-2 Score: 0  Alcohol Screen: Not on file  Housing: Low Risk (04/14/2024)   [facility]    In the last 12 months, was there a time when you were not able to pay the mortgage or rent on time?: No    In the past 12 months, how many times have you moved where you were living?: 0    At any time in the past 12 months, were you homeless or living in a shelter (including now)?: No  Utilities: Not At Risk (04/14/2024)   Received from [facility]    In the past 12 months has the electric, gas, oil, or water company threatened to shut off services in your home?: No  Health Literacy: Not on file    Review of Systems  All other systems reviewed and are negative.   PHYSICAL EXAMINATION:   BP 122/84 (BP Location: Right Arm, Patient Position: Sitting)   Pulse 72   SpO2 99%     General appearance: alert, cooperative and appears stated age   Pelvic US : Uterus 7.29 x 5.12 x 4.08 cm.  Fibroids:  intramural:  0.60 cm, 0.95 cm.  EMS:  3.94 mm.  Thin.  Symmetrical.  Left ovary 2.36 x 1.28 x 1.27 cm.  Right ovary 2.27 x 1.52 x 0.95 cm.  No adnexal masses.  No free fluid.   ASSESSMENT:  FH  ovarian cancer.  Premenopausal female.  Subcentimeter fibroids. Stable, possibly smaller since 2024. On Micronor .   PLAN:  Pelvic US  images and report reviewed.  Fibroids discussed.  Continue Micronor .   Patient is currently choosing pelvic US  every 2 years for ovarian monitoring.   FU prn.     21 min  total time was spent for this patient encounter, including preparation, face-to-face counseling with the patient, coordination of care, and documentation of the encounter.    "

## 2024-07-21 ENCOUNTER — Encounter: Payer: Self-pay | Admitting: Obstetrics and Gynecology

## 2024-07-21 ENCOUNTER — Ambulatory Visit: Admitting: Obstetrics and Gynecology

## 2024-07-21 VITALS — BP 122/84 | HR 72

## 2024-07-21 DIAGNOSIS — D219 Benign neoplasm of connective and other soft tissue, unspecified: Secondary | ICD-10-CM

## 2024-07-21 DIAGNOSIS — Z8041 Family history of malignant neoplasm of ovary: Secondary | ICD-10-CM | POA: Diagnosis not present

## 2024-07-21 MED ORDER — NORETHINDRONE 0.35 MG PO TABS: 1.0000 | ORAL_TABLET | Freq: Every day | ORAL | 2 refills | Status: AC

## 2025-03-21 ENCOUNTER — Ambulatory Visit: Admitting: Obstetrics and Gynecology
